# Patient Record
Sex: Male | Born: 1975 | Race: Asian | Hispanic: No | Marital: Single | Smoking: Former smoker
Health system: Southern US, Community
[De-identification: ages and names within clinical notes are randomized; demographics above are authoritative.]

## PROBLEM LIST (undated history)

## (undated) DIAGNOSIS — M79606 Pain in leg, unspecified: Principal | ICD-10-CM

## (undated) DIAGNOSIS — J189 Pneumonia, unspecified organism: Secondary | ICD-10-CM

## (undated) DIAGNOSIS — I82401 Acute embolism and thrombosis of unspecified deep veins of right lower extremity: Secondary | ICD-10-CM

## (undated) DIAGNOSIS — I8290 Acute embolism and thrombosis of unspecified vein: Secondary | ICD-10-CM

## (undated) DIAGNOSIS — T7840XA Allergy, unspecified, initial encounter: Secondary | ICD-10-CM

## (undated) DIAGNOSIS — Z720 Tobacco use: Secondary | ICD-10-CM

## (undated) DIAGNOSIS — D539 Nutritional anemia, unspecified: Secondary | ICD-10-CM

## (undated) DIAGNOSIS — F1010 Alcohol abuse, uncomplicated: Secondary | ICD-10-CM

## (undated) DIAGNOSIS — R51 Headache: Secondary | ICD-10-CM

## (undated) DIAGNOSIS — N179 Acute kidney failure, unspecified: Secondary | ICD-10-CM

## (undated) HISTORY — DX: Allergy, unspecified, initial encounter: T78.40XA

## (undated) HISTORY — DX: Nutritional anemia, unspecified: D53.9

## (undated) HISTORY — DX: Pain in leg, unspecified: M79.606

---

## 2002-01-10 ENCOUNTER — Emergency Department (HOSPITAL_COMMUNITY): Admission: EM | Admit: 2002-01-10 | Discharge: 2002-01-10 | Payer: Self-pay | Admitting: Emergency Medicine

## 2002-01-13 ENCOUNTER — Emergency Department (HOSPITAL_COMMUNITY): Admission: EM | Admit: 2002-01-13 | Discharge: 2002-01-13 | Payer: Self-pay | Admitting: Emergency Medicine

## 2003-06-20 ENCOUNTER — Emergency Department (HOSPITAL_COMMUNITY): Admission: EM | Admit: 2003-06-20 | Discharge: 2003-06-20 | Payer: Self-pay | Admitting: Emergency Medicine

## 2004-07-23 ENCOUNTER — Emergency Department (HOSPITAL_COMMUNITY): Admission: EM | Admit: 2004-07-23 | Discharge: 2004-07-23 | Payer: Self-pay | Admitting: Emergency Medicine

## 2009-03-08 ENCOUNTER — Emergency Department (HOSPITAL_COMMUNITY): Admission: EM | Admit: 2009-03-08 | Discharge: 2009-03-08 | Payer: Self-pay | Admitting: Emergency Medicine

## 2010-05-05 LAB — BASIC METABOLIC PANEL
BUN: 10 mg/dL (ref 6–23)
CO2: 29 mEq/L (ref 19–32)
Chloride: 100 mEq/L (ref 96–112)
Creatinine, Ser: 1.1 mg/dL (ref 0.4–1.5)
GFR calc non Af Amer: >60 mL/min (ref >60)
Glucose, Bld: 108 mg/dL — ABNORMAL HIGH (ref 70–99)

## 2010-05-05 LAB — DIFFERENTIAL
Basophils Absolute: 0 10*3/uL (ref 0.0–0.1)
Basophils Relative: 0 % (ref 0–1)
Lymphocytes Relative: 15 % (ref 12–46)
Lymphs Abs: 1.4 10*3/uL (ref 0.7–4.0)
Neutro Abs: 7.6 10*3/uL (ref 1.7–7.7)
Neutrophils Relative %: 80 % — ABNORMAL HIGH (ref 43–77)

## 2010-05-05 LAB — CK TOTAL AND CKMB (NOT AT ARMC)
CK, MB: 5.1 ng/mL — ABNORMAL HIGH (ref 0.3–4.0)
Relative Index: 0.4 (ref 0.0–2.5)
Total CK: 1171 U/L — ABNORMAL HIGH (ref 7–232)

## 2010-05-05 LAB — CBC
MCHC: 33 g/dL (ref 30.0–36.0)
MCV: 75.2 fL — ABNORMAL LOW (ref 78.0–100.0)
Platelets: 199 10*3/uL (ref 150–400)
WBC: 9.6 10*3/uL (ref 4.0–10.5)

## 2010-05-05 LAB — POCT CARDIAC MARKERS
CKMB, poc: 2.4 ng/mL (ref 1.0–8.0)
Myoglobin, poc: >500 ng/mL (ref 12–200)
Troponin i, poc: <0.05 ng/mL (ref 0.00–0.09)

## 2010-12-21 ENCOUNTER — Emergency Department (HOSPITAL_COMMUNITY): Payer: Medicaid Other

## 2010-12-21 ENCOUNTER — Inpatient Hospital Stay (HOSPITAL_COMMUNITY)
Admission: EM | Admit: 2010-12-21 | Discharge: 2010-12-26 | DRG: 176 | Disposition: A | Discharge status: Home / Self Care | Payer: Medicaid Other | Attending: Internal Medicine | Admitting: Internal Medicine

## 2010-12-21 DIAGNOSIS — R509 Fever, unspecified: Secondary | ICD-10-CM | POA: Diagnosis not present

## 2010-12-21 DIAGNOSIS — D72829 Elevated white blood cell count, unspecified: Secondary | ICD-10-CM | POA: Diagnosis present

## 2010-12-21 DIAGNOSIS — N179 Acute kidney failure, unspecified: Secondary | ICD-10-CM | POA: Diagnosis not present

## 2010-12-21 DIAGNOSIS — R0902 Hypoxemia: Secondary | ICD-10-CM | POA: Diagnosis present

## 2010-12-21 DIAGNOSIS — R9431 Abnormal electrocardiogram [ECG] [EKG]: Secondary | ICD-10-CM

## 2010-12-21 DIAGNOSIS — I2699 Other pulmonary embolism without acute cor pulmonale: Principal | ICD-10-CM | POA: Diagnosis present

## 2010-12-21 DIAGNOSIS — M79609 Pain in unspecified limb: Secondary | ICD-10-CM | POA: Diagnosis present

## 2010-12-21 DIAGNOSIS — E871 Hypo-osmolality and hyponatremia: Secondary | ICD-10-CM | POA: Diagnosis present

## 2010-12-21 DIAGNOSIS — R042 Hemoptysis: Secondary | ICD-10-CM | POA: Diagnosis present

## 2010-12-21 DIAGNOSIS — R072 Precordial pain: Secondary | ICD-10-CM | POA: Diagnosis present

## 2010-12-21 HISTORY — DX: Tobacco use: Z72.0

## 2010-12-21 HISTORY — DX: Alcohol abuse, uncomplicated: F10.10

## 2010-12-21 HISTORY — DX: Pneumonia, unspecified organism: J18.9

## 2010-12-21 HISTORY — DX: Acute kidney failure, unspecified: N17.9

## 2010-12-21 HISTORY — DX: Headache: R51

## 2010-12-21 LAB — URINALYSIS, ROUTINE W REFLEX MICROSCOPIC
Bilirubin Urine: NEGATIVE
Glucose, UA: NEGATIVE mg/dL
Ketones, ur: NEGATIVE mg/dL
Leukocytes, UA: NEGATIVE
Specific Gravity, Urine: >1.046 — ABNORMAL HIGH (ref 1.005–1.030)
pH: 6 (ref 5.0–8.0)

## 2010-12-21 LAB — CARDIAC PANEL(CRET KIN+CKTOT+MB+TROPI)
CK, MB: 1 ng/mL (ref 0.3–4.0)
Relative Index: 0.9 (ref 0.0–2.5)
Relative Index: 0.9 (ref 0.0–2.5)
Total CK: 112 U/L (ref 7–232)
Troponin I: <0.3 ng/mL (ref <0.30)
Troponin I: <0.3 ng/mL

## 2010-12-21 LAB — POCT I-STAT, CHEM 8
BUN: 14 mg/dL (ref 6–23)
Calcium, Ion: 1.35 mmol/L — ABNORMAL HIGH (ref 1.12–1.32)
Chloride: 100 mEq/L (ref 96–112)
Creatinine, Ser: 1.2 mg/dL (ref 0.50–1.35)
TCO2: 29 mmol/L (ref 0–100)

## 2010-12-21 LAB — COMPREHENSIVE METABOLIC PANEL
AST: 18 U/L (ref 0–37)
Albumin: 3.7 g/dL (ref 3.5–5.2)
Alkaline Phosphatase: 80 U/L (ref 39–117)
Chloride: 99 mEq/L (ref 96–112)
Potassium: 4 mEq/L (ref 3.5–5.1)
Total Bilirubin: 2.6 mg/dL — ABNORMAL HIGH (ref 0.3–1.2)
Total Protein: 7.3 g/dL (ref 6.0–8.3)

## 2010-12-21 LAB — PROTIME-INR
INR: 1.04 (ref 0.00–1.49)
Prothrombin Time: 13.8 seconds (ref 11.6–15.2)

## 2010-12-21 LAB — SEDIMENTATION RATE: Sed Rate: 7 mm/hr (ref 0–16)

## 2010-12-21 LAB — HEPARIN LEVEL (UNFRACTIONATED)
Heparin Unfractionated: 0.37 [IU]/mL (ref 0.30–0.70)
Heparin Unfractionated: 0.29 IU/mL — ABNORMAL LOW (ref 0.30–0.70)

## 2010-12-21 LAB — D-DIMER, QUANTITATIVE: D-Dimer, Quant: 9.52 ug/mL-FEU — ABNORMAL HIGH (ref 0.00–0.48)

## 2010-12-21 LAB — CBC
MCV: 72.6 fL — ABNORMAL LOW (ref 78.0–100.0)
Platelets: 197 10*3/uL (ref 150–400)
RBC: 6.09 MIL/uL — ABNORMAL HIGH (ref 4.22–5.81)
RDW: 13.4 % (ref 11.5–15.5)
WBC: 9.7 10*3/uL (ref 4.0–10.5)

## 2010-12-21 MED ORDER — HEPARIN BOLUS VIA INFUSION
6000.0000 [IU] | Freq: Once | INTRAVENOUS | Status: AC
  Administered 2010-12-21: 6000 [IU] via INTRAVENOUS
  Filled 2010-12-21: qty 6000

## 2010-12-21 MED ORDER — ALUM & MAG HYDROXIDE-SIMETH 200-200-20 MG/5ML PO SUSP: 30.0000 mL | Freq: Four times a day (QID) | ORAL | Status: DC | PRN

## 2010-12-21 MED ORDER — ALBUTEROL SULFATE (5 MG/ML) 0.5% IN NEBU
2.5000 mg | INHALATION_SOLUTION | RESPIRATORY_TRACT | Status: DC | PRN
  Filled 2010-12-21: qty 0.5

## 2010-12-21 MED ORDER — MORPHINE SULFATE 2 MG/ML IJ SOLN
INTRAMUSCULAR | Status: AC
  Administered 2010-12-21: 6 mg via INTRAVENOUS
  Filled 2010-12-21: qty 1

## 2010-12-21 MED ORDER — WARFARIN SODIUM 7.5 MG PO TABS
7.5000 mg | ORAL_TABLET | Freq: Once | ORAL | Status: AC
  Administered 2010-12-21: 7.5 mg via ORAL
  Filled 2010-12-21 (×2): qty 1

## 2010-12-21 MED ORDER — OXYCODONE HCL 5 MG PO TABS
5.0000 mg | ORAL_TABLET | ORAL | Status: DC | PRN
  Administered 2010-12-22 – 2010-12-26 (×8): 5 mg via ORAL
  Filled 2010-12-21 (×7): qty 1

## 2010-12-21 MED ORDER — MORPHINE SULFATE 4 MG/ML IJ SOLN
6.0000 mg | Freq: Once | INTRAMUSCULAR | Status: AC
  Administered 2010-12-21: 6 mg via INTRAVENOUS

## 2010-12-21 MED ORDER — HEPARIN BOLUS VIA INFUSION: 80.0000 [IU]/kg | Freq: Once | INTRAVENOUS | Status: DC

## 2010-12-21 MED ORDER — ZOLPIDEM TARTRATE 5 MG PO TABS
5.0000 mg | ORAL_TABLET | Freq: Every evening | ORAL | Status: DC | PRN
  Administered 2010-12-21: 5 mg via ORAL

## 2010-12-21 MED ORDER — DOCUSATE SODIUM 100 MG PO CAPS
100.0000 mg | ORAL_CAPSULE | Freq: Two times a day (BID) | ORAL | Status: DC
  Administered 2010-12-21 – 2010-12-26 (×10): 100 mg via ORAL
  Filled 2010-12-21 (×12): qty 1

## 2010-12-21 MED ORDER — SODIUM CHLORIDE 0.9 % IJ SOLN
3.0000 mL | INTRAMUSCULAR | Status: DC | PRN
  Filled 2010-12-21: qty 3

## 2010-12-21 MED ORDER — HEPARIN (PORCINE) IN NACL 100-0.45 UNIT/ML-% IJ SOLN
1650.0000 [IU]/h | INTRAMUSCULAR | Status: DC
  Filled 2010-12-21 (×3): qty 250

## 2010-12-21 MED ORDER — MORPHINE SULFATE 2 MG/ML IJ SOLN
INTRAMUSCULAR | Status: AC
  Administered 2010-12-21: 2 mg via INTRAVENOUS
  Filled 2010-12-21: qty 1

## 2010-12-21 MED ORDER — WARFARIN VIDEO
Freq: Once | Status: AC
  Administered 2010-12-22: 12:00:00
  Filled 2010-12-21: qty 1

## 2010-12-21 MED ORDER — HEPARIN (PORCINE) IN NACL 100-0.45 UNIT/ML-% IJ SOLN: 1450.0000 [IU]/h | INTRAMUSCULAR | Status: DC

## 2010-12-21 MED ORDER — ACETAMINOPHEN 325 MG PO TABS
650.0000 mg | ORAL_TABLET | ORAL | Status: DC | PRN
  Administered 2010-12-22 – 2010-12-25 (×4): 650 mg via ORAL
  Filled 2010-12-21 (×4): qty 2

## 2010-12-21 MED ORDER — IOHEXOL 300 MG/ML  SOLN
100.0000 mL | Freq: Once | INTRAMUSCULAR | Status: AC | PRN
  Administered 2010-12-21: 100 mL via INTRAVENOUS

## 2010-12-21 MED ORDER — SENNA 8.6 MG PO TABS
2.0000 | ORAL_TABLET | Freq: Every day | ORAL | Status: DC | PRN
  Administered 2010-12-24: 17.2 mg via ORAL
  Filled 2010-12-21 (×2): qty 2

## 2010-12-21 MED ORDER — ZOLPIDEM TARTRATE 5 MG PO TABS
ORAL_TABLET | ORAL | Status: AC
  Administered 2010-12-21: 5 mg via ORAL
  Filled 2010-12-21: qty 1

## 2010-12-21 MED ORDER — HEPARIN (PORCINE) IN NACL 100-0.45 UNIT/ML-% IJ SOLN: 16.0000 [IU]/kg/h | INTRAMUSCULAR | Status: DC

## 2010-12-21 MED ORDER — PATIENT'S GUIDE TO USING COUMADIN BOOK
Freq: Once | Status: AC
  Administered 2010-12-21: 18:00:00
  Filled 2010-12-21 (×2): qty 1

## 2010-12-21 MED ORDER — OXYCODONE HCL 5 MG PO TABS
ORAL_TABLET | ORAL | Status: AC
  Administered 2010-12-21: 5 mg
  Administered 2010-12-21: 5 mg via ORAL
  Filled 2010-12-21: qty 1

## 2010-12-21 MED ORDER — ONDANSETRON HCL 4 MG/2ML IJ SOLN: 4.0000 mg | Freq: Four times a day (QID) | INTRAMUSCULAR | Status: DC | PRN

## 2010-12-21 MED ORDER — IPRATROPIUM BROMIDE 0.02 % IN SOLN: 0.5000 mg | RESPIRATORY_TRACT | Status: DC | PRN

## 2010-12-21 MED ORDER — MORPHINE SULFATE 4 MG/ML IJ SOLN
INTRAMUSCULAR | Status: AC
  Filled 2010-12-21: qty 1

## 2010-12-21 MED ORDER — MORPHINE SULFATE 2 MG/ML IJ SOLN
2.0000 mg | INTRAMUSCULAR | Status: DC | PRN
  Administered 2010-12-21 – 2010-12-22 (×3): 2 mg via INTRAVENOUS
  Filled 2010-12-21 (×2): qty 1

## 2010-12-21 NOTE — ED Notes (Signed)
Consulted with pharmacy on duplicate orders.  Orders are 6000 unit bolus then change rate to 1,450 units per hours

## 2010-12-21 NOTE — Plan of Care (Signed)
Problem: Consults Goal: Diagnosis - Venous Thromboembolism (VTE) Choose a selection PE (Pulmonary Embolism)     

## 2010-12-21 NOTE — Progress Notes (Signed)
ANTICOAGULATION CONSULT NOTE - Initial Consult  Pharmacy Consult for Heparin, Warfarin Indication: Acute bilateral pulmonary emboli; bilateral DVT  No Known Allergies  Patient Measurements: Weight: 175 lb (79.379 kg)   Vital Signs: Temp: 98.8 F (37.1 C) (11/04 1214) Temp src: Oral (11/04 1214) BP: 116/56 mmHg (11/04 1214) Pulse Rate: 97  (11/04 1214)  Labs:  Basename 12/21/10 1147 12/21/10 0948 12/21/10 0636 12/21/10 0552 12/21/10 0210 12/21/10 0050  HGB -- -- -- -- 17.3* 15.3  HCT -- -- -- -- 51.0 44.2  PLT -- -- -- -- -- 197  APTT -- -- -- -- -- --  LABPROT -- -- -- 13.8 -- --  INR -- -- -- 1.04 -- --  HEPARINUNFRC -- -- <0.10* -- -- --  CREATININE -- 1.16 -- -- 1.20 --  CKTOTAL 197 -- -- -- -- --  CKMB 1.8 -- -- -- -- --  TROPONINI <0.30 -- -- -- -- --     Medical History: Past Medical History  Diagnosis Date  . Tobacco abuse   . ETOH abuse    Scheduled meds: Docusate 100mg  PO b.i.d.  PRN meds: Acetaminophen, albuterol, Mylanta, Atrovent, morphine, ondansetron, oxycodone, senna, zolpidem  *Heparin was started in ED with 6000 units IV bolus and 1450 units/hr IV infusion.    Assessment:  Acute bilateral PE/DVT, heparin already started, to begin warfarin today also.  Goal of Therapy:   Heparin level 0.3-0.7 units/ml  INR 2-3   Plan:   Check heparin level stat, then daily  CBC daily  Warfarin 7.5mg  PO tonight.  Protime/INR daily  Begin warfarin teaching.  Darion Juhasz, Brynda Greathouse K 12/21/2010,3:00 PM

## 2010-12-21 NOTE — ED Notes (Signed)
MD notified of pain level.

## 2010-12-21 NOTE — Progress Notes (Signed)
*  PRELIMINARY RESULTS*   Bilateral Venous Dopplers have  been performed. There is acute, occlusive deep vein thrombosis noted throughout the posterior tibial, coursing through to the popliteal, distal and mid femoral veins of the left lower extremity.  Sluggish flow is noted throughout the right lower extremity.   Sherren Kerns Renee 12/21/2010, 11:31 AM

## 2010-12-21 NOTE — ED Notes (Signed)
Unable to stick patient for labs made RN aware they stated they will try

## 2010-12-21 NOTE — ED Notes (Signed)
Vital signs stable. 

## 2010-12-21 NOTE — Consult Note (Signed)
Referring Physician: Ramiro Harvest, MD - [facility] Primary Physician: none Primary Cardiologist: none Reason for Consultation: Abnormal ECG in setting of bilateral PE  HPI:  Tom Dalton is a 35 yo [place] male with a history of tobacco and ETOH abuse but no other PMHx who presents to ED with several hour history of left lower substernal pleuritic chest pain. Pt states several weeks prior to admission he developed left calf pain after jumping and felt likely muskuloskeletal in nature. Pt states 1 day prior to admission while at work and after eating his lunch, at around 130pm -200pm he developed left lower substernal chest pain, pleuritic in nature and intermittent. Patient states over the afternoon his chest pain worsened, and he went home at around 630pm. Patient states around midnight his pain worsened and he presented to ED. Noted that L calf much more tender and swollen. Patient denies fever, no HA, no nausea, no emesis, no cough, no abdominal pain , no dysuria, no hemetemesis, no melema, no hematochezia, no weakness. Patient denies any family history of DVT, no recent travel, no recent surgeries or calf injuries. Patient was seen in ED CT angiogram of chest consistent with bilateral PE. Started on heparin.   Cardiology consulted called due to abnormal ECG. ECG with SR 77 with diffuse upsloping ST elevation suggestive of early repol. No change from ECG 03/08/2009.  LE u/s + for bilateral DVTs  No FHX of premature CAD or thromboembolic disease.   ROS: All other systems normal except as mentioned in HPI, past medical history and problem list.    History reviewed. No pertinent past medical history.  Medications Prior to Admission  Medication Dose Route Frequency Provider Last Rate Last Dose  . acetaminophen (TYLENOL) tablet 650 mg  650 mg Oral Q4H PRN Ramiro Harvest      . albuterol (PROVENTIL) (5 MG/ML) 0.5% nebulizer solution 2.5 mg  2.5 mg Nebulization Q2H PRN Ramiro Harvest      . alum  & mag hydroxide-simeth (MAALOX/MYLANTA) 200-200-20 MG/5ML suspension 30 mL  30 mL Oral Q6H PRN Ramiro Harvest      . docusate sodium (COLACE) capsule 100 mg  100 mg Oral BID Ramiro Harvest      . heparin 100 units/mL bolus via infusion 6,000 Units  6,000 Units Intravenous Once Vida Roller, MD   6,000 Units at 12/21/10 0545  . heparin ADULT infusion 100 units/mL (25000 units/250 mL)  1,450 Units/hr Intravenous Continuous Vida Roller, MD      . iohexol (OMNIPAQUE) 300 MG/ML injection 100 mL  100 mL Intravenous Once PRN Vida Roller, MD   100 mL at 12/21/10 0432  . ipratropium (ATROVENT) nebulizer solution 0.5 mg  0.5 mg Nebulization Q2H PRN Ramiro Harvest      . morphine 2 MG/ML injection 2 mg  2 mg Intravenous Q4H PRN Ramiro Harvest      . morphine 2 MG/ML injection        6 mg at 12/21/10 0545  . morphine 4 MG/ML injection 6 mg  6 mg Intravenous Once Vida Roller, MD   6 mg at 12/21/10 0545  . morphine 4 MG/ML injection           . ondansetron (ZOFRAN) injection 4 mg  4 mg Intravenous Q6H PRN Ramiro Harvest      . oxyCODONE (Oxy IR/ROXICODONE) immediate release tablet 5 mg  5 mg Oral Q4H PRN Ramiro Harvest      . senna (SENOKOT) tablet 17.2 mg  2  tablet Oral Daily PRN Ramiro Harvest      . sodium chloride 0.9 % injection 3 mL  3 mL Intravenous PRN Ramiro Harvest      . zolpidem (AMBIEN) tablet 5 mg  5 mg Oral QHS PRN Ramiro Harvest      . DISCONTD: heparin 100 units/mL bolus via infusion 80 Units/kg  80 Units/kg Intravenous Once Vida Roller, MD      . DISCONTD: heparin ADULT infusion 100 units/mL (25000 units/250 mL)  16 Units/kg/hr Intravenous Continuous Vida Roller, MD      . DISCONTD: heparin ADULT infusion 100 units/mL (25000 units/250 mL)  1,450 Units/hr Intravenous Continuous Vida Roller, MD      . DISCONTD: heparin ADULT infusion 100 units/mL (25000 units/250 mL)  1,450 Units/hr Intravenous Continuous Julian Crowford Darlina Guys., PHARMD       No current  outpatient prescriptions on file as of 12/21/2010.   Infusions:    . heparin    . DISCONTD: heparin    . DISCONTD: heparin    . DISCONTD: heparin      No Known Allergies  History   Social History  . Marital Status: Single    Spouse Name: N/A    Number of Children: 3  . Years of Education: N/A   Occupational History  . Fingernail engineer    Social History Main Topics  . Smoking status: Current Everyday Smoker -- 0.1 packs/day    Types: Cigarettes  . Smokeless tobacco: Not on file  . Alcohol Use: 9.0 oz/week    15 Cans of beer per week     Drinks 8-10 beers on Saturday and Sundays  . Drug Use: No     Denies  . Sexually Active: Not on file   Other Topics Concern  . Not on file   Social History Narrative  . No narrative on file    Family History  Problem Relation Age of Onset  . Stroke Father     no premature CAD  . Healthy Mother     no premature CAD    PHYSICAL EXAM: Filed Vitals:   12/21/10 0828  BP: 114/49  Pulse: 87  Temp: 98.1 F (36.7 C)  Resp: 22   General:  Well appearing. No respiratory difficulty HEENT: normal Neck: supple. no JVD. Carotids 2+ bilat; no bruits. No lymphadenopathy or thryomegaly appreciated. Cor: PMI nondisplaced. Regular rate & rhythm. No rubs, gallops or murmurs. Lungs: clear Abdomen: soft, nontender, nondistended. No hepatosplenomegaly. No bruits or masses. Good bowel sounds. Extremities: no cyanosis, clubbing, rash, edema Neuro: alert & oriented x 3, cranial nerves grossly intact. moves all 4 extremities w/o difficulty. Affect pleasant.  ECG: SR 77 diffuse ST elevation c/w early repol. No change from previous  Results for orders placed during the hospital encounter of 12/21/10 (from the past 24 hour(s))  CBC     Status: Abnormal   Collection Time   12/21/10 12:50 AM      Component Value Range   WBC 9.7  4.0 - 10.5 (K/uL)   RBC 6.09 (*) 4.22 - 5.81 (MIL/uL)   Hemoglobin 15.3  13.0 - 17.0 (g/dL)   HCT 16.1  09.6 -  04.5 (%)   MCV 72.6 (*) 78.0 - 100.0 (fL)   MCH 25.1 (*) 26.0 - 34.0 (pg)   MCHC 34.6  30.0 - 36.0 (g/dL)   RDW 40.9  81.1 - 91.4 (%)   Platelets 197  150 - 400 (K/uL)  D-DIMER, QUANTITATIVE  Status: Abnormal   Collection Time   12/21/10 12:50 AM      Component Value Range   D-Dimer, Quant 9.52 (*) 0.00 - 0.48 (ug/mL-FEU)  SEDIMENTATION RATE     Status: Normal   Collection Time   12/21/10 12:50 AM      Component Value Range   Sed Rate 7  0 - 16 (mm/hr)  PROTIME-INR     Status: Normal   Collection Time   12/21/10  5:52 AM      Component Value Range   Prothrombin Time 13.8  11.6 - 15.2 (seconds)   INR 1.04  0.00 - 1.49   HEPARIN LEVEL     Status: Abnormal   Collection Time   12/21/10  6:36 AM      Component Value Range   Heparin Unfractionated <0.10 (*) 0.30 - 0.70 (IU/mL)  COMPREHENSIVE METABOLIC PANEL     Status: Abnormal   Collection Time   12/21/10  9:48 AM      Component Value Range   Sodium 137  135 - 145 (mEq/L)   Potassium 4.0  3.5 - 5.1 (mEq/L)   Chloride 99  96 - 112 (mEq/L)   CO2 30  19 - 32 (mEq/L)   Glucose, Bld 98  70 - 99 (mg/dL)   BUN 12  6 - 23 (mg/dL)   Creatinine, Ser 4.09  0.50 - 1.35 (mg/dL)   Calcium 9.8  8.4 - 81.1 (mg/dL)   Total Protein 7.3  6.0 - 8.3 (g/dL)   Albumin 3.7  3.5 - 5.2 (g/dL)   AST 18  0 - 37 (U/L)   ALT 27  0 - 53 (U/L)   Alkaline Phosphatase 80  39 - 117 (U/L)   Total Bilirubin 2.6 (*) 0.3 - 1.2 (mg/dL)   GFR calc non Af Amer 80 (*) >90 (mL/min)   GFR calc Af Amer >90  >90 (mL/min)  MAGNESIUM     Status: Normal   Collection Time   12/21/10  9:48 AM      Component Value Range   Magnesium 1.8  1.5 - 2.5 (mg/dL)   Ct Angio Chest W/cm &/or Wo Cm  12/21/2010  *RADIOLOGY REPORT*  Clinical Data:  Chest pain and elevated D-dimer  CT ANGIOGRAPHY CHEST WITH CONTRAST  Technique:  Multidetector CT imaging of the chest was performed using the standard protocol during bolus administration of intravenous contrast.  Multiplanar CT image  reconstructions including MIPs were obtained to evaluate the vascular anatomy.  Contrast: OMNIPAQUE IOHEXOL 300 MG/ML IV SOLN  Comparison:  12/21/2010 radiograph  Findings:  Bilateral lobar are and segmental branch pulmonary emboli, with the largest clot burden is supplying the lower lobes. Heart size within normal limits.  No overt evidence for right ventricular heart strain.  No pericardial or pleural effusion.  No intrathoracic lymphadenopathy.  Normal course and caliber aorta.  Limited images through the upper abdomen show no acute abnormality.  Central airways are patent.  There are bibasilar and lingular opacities which may reflect atelectasis or infarct.  No pneumothorax.  No acute osseous abnormality.  Review of the MIP images confirms the above findings.  IMPRESSION: Bilateral pulmonary emboli within lobar and segmental branches.  No overt evidence for right ventricular heart strain.  Bibasilar and linear opacities may reflect atelectasis or infarcts.  Coronary artery calcification.  Findings discussed via telephone with Dr. Hyacinth Meeker at 05:00 a.m. on 12/21/2010  Original Report Authenticated By: Waneta Martins, M.D.     ASSESSMENT: 1)  Acute pulmonary embolism, bilateral 2) CP due to #1 3) Early repol on ECG, no change from previous 4) ETOH abuse 5) Tobacco use  PLAN:  Hemodynamically stable. Respiratory status stable. No evidence of RV strain clinically. ECG abnormalities due to early repol (no change from previous). Would continue heparin/coumadin. Consider hypercoaguable work-up. Check echo - o/w no further cardiac work-up. Counseled on need for tobacco and ETOH cessation.  Will sign-off. Please call with questions.

## 2010-12-21 NOTE — Progress Notes (Signed)
ANTICOAGULATION CONSULT NOTE - Follow Up Consult  Pharmacy Consult for Heparin Indication: PE/DVT  No Known Allergies  Patient Measurements: Height: 5\' 4"  (162.6 cm) Weight: 175 lb 7.8 oz (79.6 kg) IBW/kg (Calculated) : 59.2    Vital Signs: Temp: 100 F (37.8 C) (11/04 1611) Temp src: Oral (11/04 1611) BP: 134/74 mmHg (11/04 1611) Pulse Rate: 88  (11/04 1611)  Labs:  Basename 12/21/10 1545 12/21/10 1147 12/21/10 0948 12/21/10 0636 12/21/10 0552 12/21/10 0210 12/21/10 0050  HGB -- -- -- -- -- 17.3* 15.3  HCT -- -- -- -- -- 51.0 44.2  PLT -- -- -- -- -- -- 197  APTT -- -- -- -- -- -- --  LABPROT -- -- -- -- 13.8 -- --  INR -- -- -- -- 1.04 -- --  HEPARINUNFRC 0.37 -- -- <0.10* -- -- --  CREATININE -- -- 1.16 -- -- 1.20 --  CKTOTAL 154 197 -- -- -- -- --  CKMB 1.4 1.8 -- -- -- -- --  TROPONINI <0.30 <0.30 -- -- -- -- --   Estimated Creatinine Clearance: 83.9 ml/min (by C-G formula based on Cr of 1.16).     Assessment: 35 yo M w/ acute PE/DVT on Heparin drip per pharmacy. Current rate = 1450 units/hour. Heparin level = 0.37. No bleeding reported.  Goal of Therapy:  Heparin level 0.3-0.7 units/ml   Plan:  No change to heparin rate. Will recheck heparin level at 2200.   Gwen Her 12/21/2010,6:26 PM

## 2010-12-21 NOTE — H&P (Signed)
Tom Dalton MRN: [id number] DOB/AGE: 1975/03/04 35 y.o. Primary Care Physician:Provider Not In System Admit date: 12/21/2010 Chief Complaint: CHEST PAIN HPI:  Tom Dalton is a 35 yo [place] male with no significant medical history, who presents to ED with sevral hour history of left lower substernal pleuritic chest pain. Pt states 1 week prior to admission he developed left calf pain and felt likely muskuloskeletal in nature. Pt states 1 day prior to admission while at work and after eating his lunch, at around 130pm -200pm he developed left lower substernal chest pain, pleuritic in nature and intermittent. Patient states over the afternoon his chest pain worsened, and he went home at around 630pm. Patient states around midnight his pain worsened and he presented to ED. Patient denies fever, no HA, no nausea, no emesis, no cough, no abdominal pain , no dysuria, no hemetemesis, no melema, no hematochezia, no weakness. Patient denies any family history of DVT, no recent travel, no recent surgeries. Patient was seen in ED CT angiogram of chest consistent with bilateral PE. We were callled to admit for further evaluation and reccommendations. Patient at time of interview on heparin drip.  History reviewed. No pertinent past medical history.  History reviewed. No pertinent past surgical history.  Prior to Admission medications   Not on File    Allergies: No Known Allergies  History reviewed. No pertinent family history.  Social History: Occasional tobacco, occasional alcholol use. No IVDU. Patient lives with his girlfriend of 16 years and has 2 daughters 27, 48 and a step daughter 10y. Patient works as a Advertising account planner.         ROS: As per HPI otherwise normal.  PHYSICAL EXAM: Blood pressure 114/49, pulse 87, temperature 98.1 F (36.7 C), temperature source Oral, resp. rate 22, weight 79.379 kg (175 lb), SpO2 100.00%. General: Alert, awake, oriented x3, in no acute distress. HEENT:  [place]/AT. PERRLA, EOMI, Orophaynx clear no lesions, no exudate. Neck supple no LAD.No bruits, no goiter. Heart: Regular rate and rhythm, without murmurs, rubs, gallops. Lungs: Clear to auscultation bilaterally. Abdomen: Soft, nontender, nondistended, positive bowel sounds. Extremities: No clubbing cyanosis or edema with positive pedal pulses. LLE with calf tenderness. Positive Homans sign. Neuro: CN II-VII grossly intact. Nonfocal.    @labtest @  EKG: Not ordered. Pending.  No results found for this or any previous visit (from the past 240 hour(s)).   Results for orders placed during the hospital encounter of 12/21/10 (from the past 48 hour(s))  CBC     Status: Abnormal   Collection Time   12/21/10 12:50 AM      Component Value Range Comment   WBC 9.7  4.0 - 10.5 (K/uL)    RBC 6.09 (*) 4.22 - 5.81 (MIL/uL)    Hemoglobin 15.3  13.0 - 17.0 (g/dL)    HCT 40.9  81.1 - 91.4 (%)    MCV 72.6 (*) 78.0 - 100.0 (fL)    MCH 25.1 (*) 26.0 - 34.0 (pg)    MCHC 34.6  30.0 - 36.0 (g/dL)    RDW 78.2  95.6 - 21.3 (%)    Platelets 197  150 - 400 (K/uL)   D-DIMER, QUANTITATIVE     Status: Abnormal   Collection Time   12/21/10 12:50 AM      Component Value Range Comment   D-Dimer, Quant 9.52 (*) 0.00 - 0.48 (ug/mL-FEU)   SEDIMENTATION RATE     Status: Normal   Collection Time   12/21/10 12:50 AM  Component Value Range Comment   Sed Rate 7  0 - 16 (mm/hr)   PROTIME-INR     Status: Normal   Collection Time   12/21/10  5:52 AM      Component Value Range Comment   Prothrombin Time 13.8  11.6 - 15.2 (seconds)    INR 1.04  0.00 - 1.49    HEPARIN LEVEL     Status: Abnormal   Collection Time   12/21/10  6:36 AM      Component Value Range Comment   Heparin Unfractionated <0.10 (*) 0.30 - 0.70 (IU/mL)     Ct Angio Chest W/cm &/or Wo Cm  12/21/2010  *RADIOLOGY REPORT*  Clinical Data:  Chest pain and elevated D-dimer  CT ANGIOGRAPHY CHEST WITH CONTRAST  Technique:  Multidetector CT imaging of the  chest was performed using the standard protocol during bolus administration of intravenous contrast.  Multiplanar CT image reconstructions including MIPs were obtained to evaluate the vascular anatomy.  Contrast: OMNIPAQUE IOHEXOL 300 MG/ML IV SOLN  Comparison:  12/21/2010 radiograph  Findings:  Bilateral lobar are and segmental branch pulmonary emboli, with the largest clot burden is supplying the lower lobes. Heart size within normal limits.  No overt evidence for right ventricular heart strain.  No pericardial or pleural effusion.  No intrathoracic lymphadenopathy.  Normal course and caliber aorta.  Limited images through the upper abdomen show no acute abnormality.  Central airways are patent.  There are bibasilar and lingular opacities which may reflect atelectasis or infarct.  No pneumothorax.  No acute osseous abnormality.  Review of the MIP images confirms the above findings.  IMPRESSION: Bilateral pulmonary emboli within lobar and segmental branches.  No overt evidence for right ventricular heart strain.  Bibasilar and linear opacities may reflect atelectasis or infarcts.  Coronary artery calcification.  Findings discussed via telephone with Dr. Hyacinth Meeker at 05:00 a.m. on 12/21/2010  Original Report Authenticated By: Waneta Martins, M.D.    Impression/Plan  Principal Problem:  *Pulmonary embolism 1. Unknown etiology. Patient with no family history of DVT/PE, no recent travel, no recent surgeries. Patient already on heparin and as such unable to obtain a hypercoagulable panel. Admit to telemetry.  Will check CMET, 2 d echo to r/o RV strain, check bilateral lower extremity dopplers to r/o DVT. Continue heparin drip, and start coumadin. Place on oxygen, pain management. Follow. 2. LLE pain - Likely secondary to DVT. Patient with positive Homans sign. Check bilateral lower extremity dopplers. Patient already on anticoagulation. Pain management. Follow. 3. Prophylaxis- Patient on heparin for # 1,  for DVT prophylaxis.        Sharlie Shreffler 12/21/2010, 9:23 AM

## 2010-12-21 NOTE — Consult Note (Signed)
ANTICOAGULATION CONSULT NOTE - Follow Up Consult  Pharmacy Consult for Heparin  Indication: PE/DVT   No Known Allergies  Patient Measurements: Height: 5\' 4"  (162.6 cm) Weight: 175 lb 7.8 oz (79.6 kg) IBW/kg (Calculated) : 59.2  Adjusted Body Weight: 59.2  Vital Signs: Temp: 98.3 F (36.8 C) (11/04 2300) Temp src: Oral (11/04 2300) BP: 131/73 mmHg (11/04 2300) Pulse Rate: 94  (11/04 2300)  Labs:  Basename 12/21/10 2237 12/21/10 2230 12/21/10 1545 12/21/10 1147 12/21/10 0948 12/21/10 0636 12/21/10 0552 12/21/10 0210 12/21/10 0050  HGB -- -- -- -- -- -- -- 17.3* 15.3  HCT -- -- -- -- -- -- -- 51.0 44.2  PLT -- -- -- -- -- -- -- -- 197  APTT -- -- -- -- -- -- -- -- --  LABPROT -- -- -- -- -- -- 13.8 -- --  INR -- -- -- -- -- -- 1.04 -- --  HEPARINUNFRC -- 0.29* 0.37 -- -- <0.10* -- -- --  CREATININE -- -- -- -- 1.16 -- -- 1.20 --  CKTOTAL 112 -- 154 197 -- -- -- -- --  CKMB 1.0 -- 1.4 1.8 -- -- -- -- --  TROPONINI <0.30 -- <0.30 <0.30 -- -- -- -- --   Estimated Creatinine Clearance: 83.9 ml/min (by C-G formula based on Cr of 1.16).   Medications:    Assessment: 35 yo M w/ acute PE/DVT on Heparin drip per pharmacy. Current rate = 1450 units/hour. Heparin level = 0.29. No bleeding reported.   Level low.    Goal of Therapy:  Heparin level 0.3-0.7 units/ml    Plan:  Will increase to 1550 units/hr, check level at 0600  Darlina Guys, Jacquenette Shone Crowford 12/21/2010,11:45 PM

## 2010-12-21 NOTE — ED Notes (Signed)
Patient is resting comfortably. 

## 2010-12-21 NOTE — ED Notes (Signed)
Patient here with left flank pain since 2pm today, also with nausea after eating

## 2010-12-21 NOTE — ED Notes (Signed)
MD at bedside. 

## 2010-12-21 NOTE — Progress Notes (Signed)
  Echocardiogram 2D Echocardiogram has been performed.  Daymon Larsen, RDCS 12/21/2010, 12:49 PM

## 2010-12-22 ENCOUNTER — Inpatient Hospital Stay (HOSPITAL_COMMUNITY): Payer: Medicaid Other

## 2010-12-22 DIAGNOSIS — R509 Fever, unspecified: Secondary | ICD-10-CM | POA: Diagnosis not present

## 2010-12-22 LAB — CBC
HCT: 39.6 % (ref 39.0–52.0)
Hemoglobin: 13.2 g/dL (ref 13.0–17.0)
MCHC: 33.3 g/dL (ref 30.0–36.0)
MCV: 73.7 fL — ABNORMAL LOW (ref 78.0–100.0)
RDW: 13.2 % (ref 11.5–15.5)

## 2010-12-22 LAB — BLOOD GAS, ARTERIAL
Acid-Base Excess: 4.3 mmol/L — ABNORMAL HIGH (ref 0.0–2.0)
Drawn by: 29757
FIO2: 0.21 %
O2 Saturation: 93.9 %
Patient temperature: 98.6
pO2, Arterial: 64.9 mmHg — ABNORMAL LOW (ref 80.0–100.0)

## 2010-12-22 LAB — BASIC METABOLIC PANEL
BUN: 15 mg/dL (ref 6–23)
Chloride: 95 mEq/L — ABNORMAL LOW (ref 96–112)
Creatinine, Ser: 1.12 mg/dL (ref 0.50–1.35)
GFR calc Af Amer: >90 mL/min (ref >90)
GFR calc non Af Amer: 83 mL/min — ABNORMAL LOW (ref >90)
Glucose, Bld: 116 mg/dL — ABNORMAL HIGH (ref 70–99)

## 2010-12-22 LAB — APTT: aPTT: 110 seconds — ABNORMAL HIGH (ref 24–37)

## 2010-12-22 LAB — HEPARIN LEVEL (UNFRACTIONATED)
Heparin Unfractionated: 0.28 IU/mL — ABNORMAL LOW (ref 0.30–0.70)
Heparin Unfractionated: 0.29 IU/mL — ABNORMAL LOW (ref 0.30–0.70)

## 2010-12-22 MED ORDER — HEPARIN (PORCINE) IN NACL 100-0.45 UNIT/ML-% IJ SOLN
1850.0000 [IU]/h | INTRAMUSCULAR | Status: DC
  Administered 2010-12-23 (×2): 1850 [IU]/h via INTRAVENOUS
  Filled 2010-12-22 (×6): qty 250

## 2010-12-22 MED ORDER — LORAZEPAM 2 MG/ML IJ SOLN
1.0000 mg | Freq: Four times a day (QID) | INTRAMUSCULAR | Status: DC | PRN
  Administered 2010-12-22: 1 mg via INTRAVENOUS
  Filled 2010-12-22: qty 1

## 2010-12-22 MED ORDER — MORPHINE SULFATE 2 MG/ML IJ SOLN: 2.0000 mg | INTRAMUSCULAR | Status: AC

## 2010-12-22 MED ORDER — ALBUTEROL SULFATE (5 MG/ML) 0.5% IN NEBU
2.5000 mg | INHALATION_SOLUTION | RESPIRATORY_TRACT | Status: DC | PRN
Start: 2010-12-22 — End: 2010-12-26

## 2010-12-22 MED ORDER — OXYCODONE HCL 5 MG PO TABS
ORAL_TABLET | ORAL | Status: AC
  Administered 2010-12-22: 5 mg via ORAL
  Filled 2010-12-22: qty 1

## 2010-12-22 MED ORDER — WARFARIN SODIUM 7.5 MG PO TABS
7.5000 mg | ORAL_TABLET | Freq: Once | ORAL | Status: AC
  Administered 2010-12-22: 7.5 mg via ORAL
  Filled 2010-12-22: qty 1

## 2010-12-22 MED ORDER — KETOROLAC TROMETHAMINE 30 MG/ML IJ SOLN
30.0000 mg | Freq: Four times a day (QID) | INTRAMUSCULAR | Status: DC
  Administered 2010-12-22 – 2010-12-24 (×7): 30 mg via INTRAVENOUS
  Filled 2010-12-22 (×11): qty 1

## 2010-12-22 NOTE — Progress Notes (Signed)
ANTICOAGULATION CONSULT NOTE - Follow Up Consult  Pharmacy Consult for Warfarin and Heparin  Indication: pulmonary embolus and DVTs  No Known Allergies  Patient Measurements: Height: 5\' 4"  (162.6 cm) Weight: 173 lb 8 oz (78.7 kg) IBW/kg (Calculated) : 59.2   Vital Signs: Temp: 100.5 F (38.1 C) (11/05 1035) Temp src: Oral (11/05 1035) BP: 125/83 mmHg (11/05 1035) Pulse Rate: 95  (11/05 1035)  Labs:  Basename 12/22/10 1145 12/22/10 0423 12/22/10 0422 12/21/10 2237 12/21/10 2230 12/21/10 1545 12/21/10 1147 12/21/10 0948 12/21/10 0552 12/21/10 0210 12/21/10 0050  HGB -- -- 13.2 -- -- -- -- -- -- 17.3* --  HCT -- -- 39.6 -- -- -- -- -- -- 51.0 44.2  PLT -- -- 191 -- -- -- -- -- -- -- 197  APTT -- -- 110* -- -- -- -- -- -- -- --  LABPROT -- -- 15.1 -- -- -- -- -- 13.8 -- --  INR -- -- 1.17 -- -- -- -- -- 1.04 -- --  HEPARINUNFRC 0.28* 0.33 -- -- 0.29* -- -- -- -- -- --  CREATININE -- -- 1.12 -- -- -- -- 1.16 -- 1.20 --  CKTOTAL -- -- -- 112 -- 154 197 -- -- -- --  CKMB -- -- -- 1.0 -- 1.4 1.8 -- -- -- --  TROPONINI -- -- -- <0.30 -- <0.30 <0.30 -- -- -- --   Estimated Creatinine Clearance: 86.4 ml/min (by C-G formula based on Cr of 1.12).   Medications:  Scheduled:    . docusate sodium  100 mg Oral BID  . oxyCODONE      . patient's guide to using coumadin book   Does not apply Once  . warfarin  7.5 mg Oral ONCE-1800  . warfarin   Does not apply Once   Infustions:    . heparin 1,550 Units/hr (12/22/10 0000)   PRN: acetaminophen, albuterol, alum & mag hydroxide-simeth, ipratropium, morphine, ondansetron (ZOFRAN) IV, oxyCODONE, senna, sodium chloride, zolpidem  Assessment: 35 YO male with PE+DVT. Heparin level below goal 0.3-0.7. INR progressing towards goal 2-3. Heparin currently running at 1550 units/hr. RN reported nose bleed about 5am - no orders received to hold anticoag, so will proceed.   Goal of Therapy:  INR 2-3 Heparin level 0.3-0.7 units/ml   Plan:  1)   Increase heparin to 1650 units/hr (16.5 ml/hr) 2)  Repeat warfarin 7.5mg  PO x1 at 18:00 3)  Heparin level at 20:00 tonight. 4) Pharmacy will f/u on tonight's heparin level.  [name] 12/22/2010,1:06 PM

## 2010-12-22 NOTE — Progress Notes (Signed)
ANTICOAGULATION CONSULT NOTE - Follow Up Consult  Pharmacy Consult for  Indication: pulmonary embolus and DVT  No Known Allergies  Patient Measurements: Height: 5\' 4"  (162.6 cm) Weight: 173 lb 8 oz (78.7 kg) IBW/kg (Calculated) : 59.2   Vital Signs: Temp: 98.3 F (36.8 C) (11/05 2130) Temp src: Oral (11/05 2130) BP: 149/94 mmHg (11/05 2130) Pulse Rate: 89  (11/05 2130)  Labs:  Basename 12/22/10 1955 12/22/10 1145 12/22/10 0423 12/22/10 0422 12/21/10 2237 12/21/10 1545 12/21/10 1147 12/21/10 0948 12/21/10 0552 12/21/10 0210 12/21/10 0050  HGB -- -- -- 13.2 -- -- -- -- -- 17.3* --  HCT -- -- -- 39.6 -- -- -- -- -- 51.0 44.2  PLT -- -- -- 191 -- -- -- -- -- -- 197  APTT -- -- -- 110* -- -- -- -- -- -- --  LABPROT -- -- -- 15.1 -- -- -- -- 13.8 -- --  INR -- -- -- 1.17 -- -- -- -- 1.04 -- --  HEPARINUNFRC 0.29* 0.28* 0.33 -- -- -- -- -- -- -- --  CREATININE -- -- -- 1.12 -- -- -- 1.16 -- 1.20 --  CKTOTAL -- -- -- -- 112 154 197 -- -- -- --  CKMB -- -- -- -- 1.0 1.4 1.8 -- -- -- --  TROPONINI -- -- -- -- <0.30 <0.30 <0.30 -- -- -- --   Estimated Creatinine Clearance: 86.4 ml/min (by C-G formula based on Cr of 1.12).   Medications:  Heparin 1650 units/hr  Assessment:  Heparin level 0.29 from 0.28 after rate increase of 100 units/hr  No bleeding noted, sticky note from this am incorrect (was written on wrong patient) Goal of Therapy:  Heparin level 0.3-0.7 units/ml   Plan:  Will increase rate to 1850 units/hr, recheck hep level in am. Otho Bellows 12/22/2010 10:16 PM      Chilton Si, Camelia Eng L 12/22/2010,10:04 PM

## 2010-12-22 NOTE — Progress Notes (Signed)
Physical Therapy Note  Order received. Chart reviewed. Attempted PT evaluation. Pt declined to participate due to severe pain. Will check back another day as schedule permits. Thanks.

## 2010-12-22 NOTE — Consult Note (Signed)
ANTICOAGULATION CONSULT NOTE - Follow Up Consult  Pharmacy Consult for heparin Indication: DVT  No Known Allergies  Patient Measurements: Height: 5\' 4"  (162.6 cm) Weight: 175 lb 7.8 oz (79.6 kg) IBW/kg (Calculated) : 59.2  Adjusted Body Weight:   Vital Signs: Temp: 99.6 F (37.6 C) (11/05 0235) Temp src: Oral (11/05 0235) BP: 116/77 mmHg (11/05 0235) Pulse Rate: 97  (11/05 0235)  Labs:  Alvira Philips 12/22/10 0423 12/22/10 0422 12/21/10 2237 12/21/10 2230 12/21/10 1545 12/21/10 1147 12/21/10 0948 12/21/10 0552 12/21/10 0210 12/21/10 0050  HGB -- 13.2 -- -- -- -- -- -- 17.3* --  HCT -- 39.6 -- -- -- -- -- -- 51.0 44.2  PLT -- 191 -- -- -- -- -- -- -- 197  APTT -- 110* -- -- -- -- -- -- -- --  LABPROT -- 15.1 -- -- -- -- -- 13.8 -- --  INR -- 1.17 -- -- -- -- -- 1.04 -- --  HEPARINUNFRC 0.33 -- -- 0.29* 0.37 -- -- -- -- --  CREATININE -- 1.12 -- -- -- -- 1.16 -- 1.20 --  CKTOTAL -- -- 112 -- 154 197 -- -- -- --  CKMB -- -- 1.0 -- 1.4 1.8 -- -- -- --  TROPONINI -- -- <0.30 -- <0.30 <0.30 -- -- -- --   Estimated Creatinine Clearance: 86.9 ml/min (by C-G formula based on Cr of 1.12).   Medications:   Assessment: Heparin level at goal (0.33), but drawn a little early.    Goal of Therapy:  Heparin level 0.3-0.7 units/ml   Plan:   Will continue with rate of 1550 units/hr and recheck level at 1100.   Darlina Guys, Jacquenette Shone Crowford 12/22/2010,5:47 AM

## 2010-12-22 NOTE — Progress Notes (Signed)
Pt had an episode of shallow breathing, c/o pain 10/10 on pain scale on left side of chest and arm consistent with previous pain felt in that area. O2 applied, sats 93%, HR 114, BP 149/91, temp 102.6. Dr Rama notified, orders obtained, pain med given, will continue to monitor. Tom Dalton

## 2010-12-22 NOTE — Progress Notes (Signed)
[id number]/Tom Dalton/Utilization review of Chart performed. 

## 2010-12-22 NOTE — Progress Notes (Signed)
Subjective: The patient is markedly dyspneic and complaining of pleuritic-type chest pain that worsens with deep inspiration. He is anxious and I have been called by the nursing staff do to his worsening symptoms. The patient denies pain anywhere else. He has been febrile.  Objective:  Vital signs in last 24 hours:  Filed Vitals:   12/22/10 0649 12/22/10 1035 12/22/10 1300 12/22/10 1431  BP: 108/72 125/83 154/80 119/80  Pulse: 91 95 110   Temp: 100 F (37.8 C) 100.5 F (38.1 C) 100.7 F (38.2 C) 102.6 F (39.2 C)  TempSrc: Oral Oral Oral Oral  Resp: 18 18 18    Height:      Weight: 78.7 kg (173 lb 8 oz)     SpO2: 97% 97% 91%     Intake/Output from previous day:   Intake/Output Summary (Last 24 hours) at 12/22/10 1533 Last data filed at 12/22/10 1300  Gross per 24 hour  Intake 583.13 ml  Output    425 ml  Net 158.13 ml    Physical Exam: General Appearance:    Alert, cooperative, anxious, appears stated age  Lungs:     Diminished to auscultation bilaterally, respirations mildly labored   Heart:    Regular rate and rhythm, S1 and S2 normal, no murmur, rub   or gallop  Abdomen:     Soft, non-tender, bowel sounds active all four quadrants,    no masses, no organomegaly  Extremities:   No cyanosis or edema.  Mild clubbing.  Pulses:   2+ and symmetric all extremities    Lab Results:  Basic Metabolic Panel:    Component Value Date/Time   NA 133* 12/22/2010 0422   K 3.7 12/22/2010 0422   CL 95* 12/22/2010 0422   CO2 28 12/22/2010 0422   BUN 15 12/22/2010 0422   CREATININE 1.12 12/22/2010 0422   GLUCOSE 116* 12/22/2010 0422   CALCIUM 9.5 12/22/2010 0422   CBC:    Component Value Date/Time   WBC 12.1* 12/22/2010 0422   HGB 13.2 12/22/2010 0422   HCT 39.6 12/22/2010 0422   PLT 191 12/22/2010 0422   MCV 73.7* 12/22/2010 0422   NEUTROABS 7.6 03/08/2009 1550   LYMPHSABS 1.4 03/08/2009 1550   MONOABS 0.5 03/08/2009 1550   EOSABS 0.0 03/08/2009 1550   BASOSABS 0.0 03/08/2009 1550      No results found for this or any previous visit (from the past 240 hour(s)).  Studies/Results: Dg Chest 2 View  12/21/2010  *RADIOLOGY REPORT*  Clinical Data: Chest pain  CHEST - 2 VIEW  Comparison: None.  Findings: Mild bibasilar and lingular opacities.  Heart size upper normal limits to mildly enlarged. Mediastinal contours otherwise within normal limits. No pleural effusion or pneumothorax.  No acute osseous abnormality.  IMPRESSION: Bibasilar and lingular opacities; atelectasis versus infiltrate.  Original Report Authenticated By: Waneta Martins, M.D.   Ct Angio Chest W/cm &/or Wo Cm  12/21/2010  *RADIOLOGY REPORT*  Clinical Data:  Chest pain and elevated D-dimer  CT ANGIOGRAPHY CHEST WITH CONTRAST  Technique:  Multidetector CT imaging of the chest was performed using the standard protocol during bolus administration of intravenous contrast.  Multiplanar CT image reconstructions including MIPs were obtained to evaluate the vascular anatomy.  Contrast: OMNIPAQUE IOHEXOL 300 MG/ML IV SOLN  Comparison:  12/21/2010 radiograph  Findings:  Bilateral lobar are and segmental branch pulmonary emboli, with the largest clot burden is supplying the lower lobes. Heart size within normal limits.  No overt evidence for right  ventricular heart strain.  No pericardial or pleural effusion.  No intrathoracic lymphadenopathy.  Normal course and caliber aorta.  Limited images through the upper abdomen show no acute abnormality.  Central airways are patent.  There are bibasilar and lingular opacities which may reflect atelectasis or infarct.  No pneumothorax.  No acute osseous abnormality.  Review of the MIP images confirms the above findings.  IMPRESSION: Bilateral pulmonary emboli within lobar and segmental branches.  No overt evidence for right ventricular heart strain.  Bibasilar and linear opacities may reflect atelectasis or infarcts.  Coronary artery calcification.  Findings discussed via telephone with  Dr. Hyacinth Meeker at 05:00 a.m. on 12/21/2010  Original Report Authenticated By: Waneta Martins, M.D.    Medications: Scheduled Meds:   . docusate sodium  100 mg Oral BID  .  morphine injection  2 mg Intravenous NOW  . oxyCODONE      . patient's guide to using coumadin book   Does not apply Once  . warfarin  7.5 mg Oral ONCE-1800  . warfarin  7.5 mg Oral ONCE-1800  . warfarin   Does not apply Once   Continuous Infusions:   . heparin 1,550 Units/hr (12/22/10 0000)   PRN Meds:.acetaminophen, albuterol, alum & mag hydroxide-simeth, ipratropium, morphine, ondansetron (ZOFRAN) IV, oxyCODONE, senna, sodium chloride, zolpidem  Assessment/Plan:  Principal Problem:  *Pulmonary embolism: Unclear trigger. The patient is currently on Coumadin and heparin per pharmacy protocol. Will repeat a chest x-ray and obtain an arterial blood gas given the patient's decompensation. Active Problems:  Fever: This may be due to pulmonary infarction but cannot rule out a superimposed pneumonia. I have ordered a stat chest x-ray and will prescribe an antibiotic if there is any indication that he is developing an acute pneumonia. He does have a mild leukocytosis which is also consistent with pulmonary infarction. We'll start him on anti-inflammatory medicine (Toradol).  Hyponatremia: Mild. We'll monitor.  Leukocytosis: , Maybe secondary to pulmonary infarction versus a developing pneumonia. I have requested a stat chest x-ray and we will followup on a CBC in the morning.    LOS: 1 day   RAMA,CHRISTINA 12/22/2010, 3:33 PM

## 2010-12-23 DIAGNOSIS — R0902 Hypoxemia: Secondary | ICD-10-CM | POA: Diagnosis present

## 2010-12-23 LAB — CBC
HCT: 40.2 % (ref 39.0–52.0)
Hemoglobin: 13.4 g/dL (ref 13.0–17.0)
MCHC: 33.3 g/dL (ref 30.0–36.0)
RBC: 5.49 MIL/uL (ref 4.22–5.81)

## 2010-12-23 LAB — PROTIME-INR
INR: 1.55 — ABNORMAL HIGH (ref 0.00–1.49)
Prothrombin Time: 18.9 seconds — ABNORMAL HIGH (ref 11.6–15.2)

## 2010-12-23 LAB — HEPARIN LEVEL (UNFRACTIONATED): Heparin Unfractionated: 0.39 IU/mL (ref 0.30–0.70)

## 2010-12-23 LAB — BASIC METABOLIC PANEL
Calcium: 9.8 mg/dL (ref 8.4–10.5)
GFR calc Af Amer: 70 mL/min — ABNORMAL LOW (ref >90)
GFR calc non Af Amer: 60 mL/min — ABNORMAL LOW (ref >90)
Glucose, Bld: 102 mg/dL — ABNORMAL HIGH (ref 70–99)
Potassium: 3.7 mEq/L (ref 3.5–5.1)
Sodium: 134 mEq/L — ABNORMAL LOW (ref 135–145)

## 2010-12-23 MED ORDER — COUMADIN BOOK
Freq: Once | Status: AC
  Administered 2010-12-23: 18:00:00
  Filled 2010-12-23: qty 1

## 2010-12-23 MED ORDER — WARFARIN SODIUM 7.5 MG PO TABS
7.5000 mg | ORAL_TABLET | Freq: Once | ORAL | Status: AC
  Administered 2010-12-23: 7.5 mg via ORAL
  Filled 2010-12-23: qty 1

## 2010-12-23 MED ORDER — MOXIFLOXACIN HCL 400 MG PO TABS
400.0000 mg | ORAL_TABLET | ORAL | Status: DC
  Administered 2010-12-23 – 2010-12-25 (×3): 400 mg via ORAL
  Filled 2010-12-23 (×5): qty 1

## 2010-12-23 MED ORDER — PNEUMOCOCCAL VAC POLYVALENT 25 MCG/0.5ML IJ INJ
0.5000 mL | INJECTION | INTRAMUSCULAR | Status: AC
  Administered 2010-12-24: 0.5 mL via INTRAMUSCULAR
  Filled 2010-12-23 (×2): qty 0.5

## 2010-12-23 NOTE — Progress Notes (Signed)
ANTICOAGULATION CONSULT NOTE - Follow Up Consult  Pharmacy Consult for Warfarin and Heparin Indication: PE and DVT  No Known Allergies  Patient Measurements: Height: 5\' 4"  (162.6 cm) Weight: 172 lb 6.4 oz (78.2 kg) IBW/kg (Calculated) : 59.2   Vital Signs: Temp: 99.5 F (37.5 C) (11/06 0611) Temp src: Oral (11/06 0611) BP: 127/77 mmHg (11/06 0611) Pulse Rate: 90  (11/06 0611)  Labs:  Tom Dalton 12/23/10 0455 12/22/10 1955 12/22/10 1145 12/22/10 0422 12/21/10 2237 12/21/10 1545 12/21/10 1147 12/21/10 0948 12/21/10 0552 12/21/10 0210 12/21/10 0050  HGB 13.4 -- -- 13.2 -- -- -- -- -- -- --  HCT 40.2 -- -- 39.6 -- -- -- -- -- 51.0 --  PLT 212 -- -- 191 -- -- -- -- -- -- 197  APTT 131* -- -- 110* -- -- -- -- -- -- --  LABPROT 18.9* -- -- 15.1 -- -- -- -- 13.8 -- --  INR 1.55* -- -- 1.17 -- -- -- -- 1.04 -- --  HEPARINUNFRC 0.39 0.29* 0.28* -- -- -- -- -- -- -- --  CREATININE 1.46* -- -- 1.12 -- -- -- 1.16 -- -- --  CKTOTAL -- -- -- -- 112 154 197 -- -- -- --  CKMB -- -- -- -- 1.0 1.4 1.8 -- -- -- --  TROPONINI -- -- -- -- <0.30 <0.30 <0.30 -- -- -- --   Estimated Creatinine Clearance: 66.1 ml/min (by C-G formula based on Cr of 1.46).   Medications:  Scheduled:    . docusate sodium  100 mg Oral BID  . ketorolac  30 mg Intravenous Q6H  .  morphine injection  2 mg Intravenous NOW  . warfarin  7.5 mg Oral ONCE-1800  . warfarin   Does not apply Once   Infustions:    . heparin 1,850 Units/hr (12/23/10 0646)  . DISCONTD: heparin 1,650 Units/hr (12/22/10 2000)   PRN: acetaminophen, albuterol, alum & mag hydroxide-simeth, ipratropium, LORazepam, morphine, ondansetron (ZOFRAN) IV, oxyCODONE, senna, sodium chloride, zolpidem, DISCONTD: albuterol  Assessment: Day #3 of 5 day minimum overlap with heparin/warfarin for new PE+DVT (started 12/21/10). Heparin level therapeutic. INR progressing towards goal 2-3. Heparin level therapeutic.  No further mention of bleeding or nose bleeds  reported.  Goal of Therapy:  INR 2-3 Heparin level 0.3-0.7 units/ml   Plan:  1) Continue heparin at 1850 units/hr. (18.5 ml/hr). 2) Repeat warfarin 7.5mg  PO x1 at 18:00. 3) F/U daily heparin levels and PT/INRs. 4) Continue heparin for 2 more days minimum, AND until INR > 2 x2 consecutive days.  Tom Dalton 12/23/2010,8:28 AM

## 2010-12-23 NOTE — Progress Notes (Signed)
Subjective: Tom Dalton feels a little better today. He is not nearly as dyspneic as he was yesterday. He still reporting left chest and flank pain that worsens with deep inspiration. He states that he has been up and ambulated without any difficulty. He is reporting some ongoing intermittent fevers. He reports his appetite is good and that he has not had any problems with diarrhea or other bowel issues.  Objective: Vital signs in last 24 hours:  Filed Vitals:   12/23/10 0611 12/23/10 1009 12/23/10 1014 12/23/10 1030  BP: 127/77 102/69    Pulse: 90 94 96 98  Temp: 99.5 F (37.5 C) 99.6 F (37.6 C)    TempSrc: Oral Oral    Resp: 24 20    Height:      Weight: 78.2 kg (172 lb 6.4 oz)     SpO2: 90% 98% 98% 92%    Intake/Output from previous day:   Intake/Output Summary (Last 24 hours) at 12/23/10 1207 Last data filed at 12/23/10 0900  Gross per 24 hour  Intake   1113 ml  Output    425 ml  Net    688 ml    Physical Exam: General Appearance:    Alert, cooperative, no distress, appears stated age  Lungs:     Decreased breath sounds, left greater than right , respirations unlabored   Heart:    Regular rate and rhythm, S1 and S2 normal, no murmur, rub   or gallop  Abdomen:     Soft, non-tender, bowel sounds active all four quadrants,    no masses, no organomegaly  Extremities:   Extremities normal, atraumatic, no cyanosis or edema  Pulses:   2+ and symmetric all extremities    Lab Results:  Basic Metabolic Panel:    Component Value Date/Time   NA 134* 12/23/2010 0455   K 3.7 12/23/2010 0455   CL 93* 12/23/2010 0455   CO2 31 12/23/2010 0455   BUN 23 12/23/2010 0455   CREATININE 1.46* 12/23/2010 0455   GLUCOSE 102* 12/23/2010 0455   CALCIUM 9.8 12/23/2010 0455   CBC:    Component Value Date/Time   WBC 11.2* 12/23/2010 0455   HGB 13.4 12/23/2010 0455   HCT 40.2 12/23/2010 0455   PLT 212 12/23/2010 0455   MCV 73.2* 12/23/2010 0455   NEUTROABS 7.6 03/08/2009 1550   LYMPHSABS 1.4  03/08/2009 1550   MONOABS 0.5 03/08/2009 1550   EOSABS 0.0 03/08/2009 1550   BASOSABS 0.0 03/08/2009 1550    No results found for this or any previous visit (from the past 240 hour(s)).  Studies/Results: Dg Chest Portable 1 View  12/22/2010  *RADIOLOGY REPORT*  Clinical Data: Chest pain, shortness of breath, fever  PORTABLE CHEST - 1 VIEW  Comparison: Portable exam 1527 hours compared to 12/21/2010  Findings: Decreased lung volumes. Mild enlargement of cardiac silhouette. Mediastinal contours normal. Pulmonary vascular congestion. Mild persistent right basilar atelectasis. Increased opacity at left lung base likely representing increased consolidation and associated left pleural effusion. Persistent decrease in lung volumes. No pneumothorax or acute osseous findings.  IMPRESSION: Persistent decreased lung volumes right basilar atelectasis. Increased left lower lobe consolidation with small associated pleural effusion.  Original Report Authenticated By: Tom Dalton, M.D.    Medications: Scheduled Meds:   . coumadin book   Does not apply Once  . docusate sodium  100 mg Oral BID  . ketorolac  30 mg Intravenous Q6H  .  morphine injection  2 mg Intravenous NOW  .  warfarin  7.5 mg Oral ONCE-1800  . warfarin  7.5 mg Oral ONCE-1800   Continuous Infusions:   . heparin 1,850 Units/hr (12/23/10 0646)  . DISCONTD: heparin 1,650 Units/hr (12/22/10 2000)   PRN Meds:.acetaminophen, albuterol, alum & mag hydroxide-simeth, ipratropium, LORazepam, morphine, ondansetron (ZOFRAN) IV, oxyCODONE, senna, sodium chloride, zolpidem, DISCONTD: albuterol  Assessment/Plan:  Principal Problem:  *Pulmonary embolism: Unclear trigger. He will need a hypercoagulable profile after he finishes his current course of anticoagulation therapy. He remains on a heparin infusion and Coumadin per pharmacy dosing.  His INR today was 1.55. Active Problems:  Fever: This is most likely due to pulmonary infarction.  He has a  low-grade fever and leukocytosis. His symptoms seem to have improved with empiric Toradol therapy. A chest radiograph was done yesterday does show some consolidation and worsening opacity on the left side, where his pain is.  I will go ahead and empirically start him on Avelox to cover pneumonia.  Hypoxia: An arterial blood gas was done yesterday because of the patient's worsening symptoms. He was hypoxic and has responded to supplemental oxygen therapy to maintain his oxygen saturations.  Hyponatremia: Mild. We'll monitor.  Leukocytosis: May be secondary to pulmonary infarction versus a developing pneumonia. I have started him on Avelox to address the possibility of underlying pulmonary infection.  Chest pain syndrome: Patient is on p.r.n pain medicines for pain control.    LOS: 2 days   Tom Dalton,Tom Dalton 12/23/2010, 12:07 PM

## 2010-12-23 NOTE — Progress Notes (Signed)
TALKED TO PATIENT ABOUT FOLLOW UP MEDICAL CARE; PATIENT IS AGREED TO F/U WITH [facility]; ELIGIBILITY APT IS Feb 18, 2011 AND APT WITH DR Clelia Croft WITH [facility] IS Feb 27, 2011 AT 1045; INFORMATION GIVEN TO THE PATIENT;

## 2010-12-23 NOTE — Progress Notes (Signed)
Pharmacy Note - Warfarin Education  Patient educated on use of warfarin. Pt expressed verbal understanding. All questions answered. Pt had no further questions at this time. Instructed pt to inform nurse if he thought of any other questions a pharmacist can come back to re-educate.

## 2010-12-24 ENCOUNTER — Encounter (HOSPITAL_COMMUNITY): Payer: Self-pay | Admitting: Internal Medicine

## 2010-12-24 DIAGNOSIS — N179 Acute kidney failure, unspecified: Secondary | ICD-10-CM

## 2010-12-24 HISTORY — DX: Acute kidney failure, unspecified: N17.9

## 2010-12-24 LAB — PROTIME-INR
INR: 2.56 — ABNORMAL HIGH (ref 0.00–1.49)
Prothrombin Time: 27.9 seconds — ABNORMAL HIGH (ref 11.6–15.2)

## 2010-12-24 LAB — APTT: aPTT: >200 seconds (ref 24–37)

## 2010-12-24 LAB — HEPARIN LEVEL (UNFRACTIONATED): Heparin Unfractionated: 0.34 IU/mL (ref 0.30–0.70)

## 2010-12-24 LAB — BASIC METABOLIC PANEL
BUN: 17 mg/dL (ref 6–23)
Calcium: 9.5 mg/dL (ref 8.4–10.5)
Creatinine, Ser: 1.36 mg/dL — ABNORMAL HIGH (ref 0.50–1.35)
GFR calc Af Amer: 76 mL/min — ABNORMAL LOW (ref >90)
GFR calc non Af Amer: 66 mL/min — ABNORMAL LOW (ref >90)
Glucose, Bld: 104 mg/dL — ABNORMAL HIGH (ref 70–99)
Potassium: 4.1 mEq/L (ref 3.5–5.1)

## 2010-12-24 LAB — CBC
Hemoglobin: 11.8 g/dL — ABNORMAL LOW (ref 13.0–17.0)
MCH: 24.4 pg — ABNORMAL LOW (ref 26.0–34.0)
MCHC: 33.1 g/dL (ref 30.0–36.0)
RDW: 13 % (ref 11.5–15.5)

## 2010-12-24 MED ORDER — HEPARIN (PORCINE) IN NACL 100-0.45 UNIT/ML-% IJ SOLN
1850.0000 [IU]/h | INTRAMUSCULAR | Status: DC
Start: 2010-12-24 — End: 2010-12-26
  Administered 2010-12-24 – 2010-12-26 (×3): 1850 [IU]/h via INTRAVENOUS
  Filled 2010-12-24 (×8): qty 250

## 2010-12-24 MED ORDER — HYDROCOD POLST-CHLORPHEN POLST 10-8 MG/5ML PO LQCR
5.0000 mL | Freq: Two times a day (BID) | ORAL | Status: DC | PRN
  Administered 2010-12-26: 5 mL via ORAL
  Filled 2010-12-24: qty 5

## 2010-12-24 NOTE — Progress Notes (Signed)
ANTICOAGULATION CONSULT NOTE - Follow Up Consult  Pharmacy Consult for Heparin  Indication: PE and DVT  No Known Allergies  Patient Measurements: Height: 5\' 4"  (162.6 cm) Weight: 172 lb 6.4 oz (78.2 kg) IBW/kg (Calculated) : 59.2   Vital Signs: Temp: 98 F (36.7 C) (11/07 0700) Temp src: Oral (11/07 0700) BP: 112/71 mmHg (11/07 0700) Pulse Rate: 56  (11/07 0700)  Labs:  Basename 12/24/10 0535 12/23/10 0455 12/22/10 1955 12/22/10 0422 12/21/10 2237 12/21/10 1545 12/21/10 1147  HGB 11.8* 13.4 -- -- -- -- --  HCT 35.6* 40.2 -- 39.6 -- -- --  PLT 232 212 -- 191 -- -- --  APTT >200* 131* -- 110* -- -- --  LABPROT 27.9* 18.9* -- 15.1 -- -- --  INR 2.56* 1.55* -- 1.17 -- -- --  HEPARINUNFRC 0.28* 0.39 0.29* -- -- -- --  CREATININE 1.36* 1.46* -- 1.12 -- -- --  CKTOTAL -- -- -- -- 112 154 197  CKMB -- -- -- -- 1.0 1.4 1.8  TROPONINI -- -- -- -- <0.30 <0.30 <0.30   Estimated Creatinine Clearance: 70.9 ml/min (by C-G formula based on Cr of 1.36).   Medications:  Scheduled:    . coumadin book   Does not apply Once  . docusate sodium  100 mg Oral BID  . ketorolac  30 mg Intravenous Q6H  . moxifloxacin  400 mg Oral Q24H  . pneumococcal 23 valent vaccine  0.5 mL Intramuscular Tomorrow-1000  . warfarin  7.5 mg Oral ONCE-1800   Infustions:    . DISCONTD: heparin 1,850 Units/hr (12/23/10 2140)    Assessment: APTT >200 this am but heparin level is actually below target range.  No bleeding reported.   Goal of Therapy:  INR 2-3 Heparin level 0.3-0.7 units/ml   Plan:  Continue current heparin infusion at 1850units/hr and check a heparin level later today to verify.  Discontinue aPTTs as this is not used to measure heparin under our current protocol.  Reece Packer 12/24/2010,7:03 AM

## 2010-12-24 NOTE — Progress Notes (Signed)
Subjective: Tom Dalton continues to report improvement in his dyspnea and pleuritic type chest pain. He is coughing and reports that when he coughs, it aggravates his pain. He did report an episode of bloody nose last night. Overall, he does feel better. Objective: Vital signs in last 24 hours:  Filed Vitals:   12/23/10 1726 12/23/10 2148 12/24/10 0239 12/24/10 0700  BP: 124/71 118/77  112/71  Pulse: 98 84  56  Temp: 99.9 F (37.7 C) 98.6 F (37 C) 99 F (37.2 C) 98 F (36.7 C)  TempSrc: Oral Oral Oral Oral  Resp: 20 24  20   Height:      Weight:    78.2 kg (172 lb 6.4 oz)  SpO2: 98% 97%  99%    Intake/Output from previous day:   Intake/Output Summary (Last 24 hours) at 12/24/10 1017 Last data filed at 12/24/10 0900  Gross per 24 hour  Intake    767 ml  Output      0 ml  Net    767 ml    Physical Exam: General Appearance:    Alert, cooperative, no distress, appears stated age  Lungs:     Decreased breath sounds, crackles left base.   Heart:    Regular rate and rhythm, S1 and S2 normal, no murmur, rub   or gallop  Abdomen:     Soft, non-tender, bowel sounds active all four quadrants,    no masses, no organomegaly  Extremities:   Extremities normal, atraumatic, no cyanosis or edema  Pulses:   2+ and symmetric all extremities    Lab Results:  Basic Metabolic Panel:    Component Value Date/Time   NA 134* 12/24/2010 0535   K 4.1 12/24/2010 0535   CL 96 12/24/2010 0535   CO2 31 12/24/2010 0535   BUN 17 12/24/2010 0535   CREATININE 1.36* 12/24/2010 0535   GLUCOSE 104* 12/24/2010 0535   CALCIUM 9.5 12/24/2010 0535   CBC:    Component Value Date/Time   WBC 9.6 12/24/2010 0535   HGB 11.8* 12/24/2010 0535   HCT 35.6* 12/24/2010 0535   PLT 232 12/24/2010 0535   MCV 73.6* 12/24/2010 0535   NEUTROABS 7.6 03/08/2009 1550   LYMPHSABS 1.4 03/08/2009 1550   MONOABS 0.5 03/08/2009 1550   EOSABS 0.0 03/08/2009 1550   BASOSABS 0.0 03/08/2009 1550    No results found for this or any  previous visit (from the past 240 hour(s)).  Studies/Results: Dg Chest Portable 1 View  12/22/2010  *RADIOLOGY REPORT*  Clinical Data: Chest pain, shortness of breath, fever  PORTABLE CHEST - 1 VIEW  Comparison: Portable exam 1527 hours compared to 12/21/2010  Findings: Decreased lung volumes. Mild enlargement of cardiac silhouette. Mediastinal contours normal. Pulmonary vascular congestion. Mild persistent right basilar atelectasis. Increased opacity at left lung base likely representing increased consolidation and associated left pleural effusion. Persistent decrease in lung volumes. No pneumothorax or acute osseous findings.  IMPRESSION: Persistent decreased lung volumes right basilar atelectasis. Increased left lower lobe consolidation with small associated pleural effusion.  Original Report Authenticated By: Lollie Marrow, M.D.    Medications: Scheduled Meds:    . coumadin book   Does not apply Once  . docusate sodium  100 mg Oral BID  . ketorolac  30 mg Intravenous Q6H  . moxifloxacin  400 mg Oral Q24H  . pneumococcal 23 valent vaccine  0.5 mL Intramuscular Tomorrow-1000  . warfarin  7.5 mg Oral ONCE-1800   Continuous Infusions:    .  heparin    . DISCONTD: heparin 1,850 Units/hr (12/23/10 2140)   PRN Meds:.acetaminophen, albuterol, alum & mag hydroxide-simeth, ipratropium, LORazepam, morphine, ondansetron (ZOFRAN) IV, oxyCODONE, senna, sodium chloride, zolpidem  Assessment/Plan:  Principal Problem:  *Pulmonary embolism: Unclear trigger. He will need a hypercoagulable profile after he finishes his current course of anticoagulation therapy. He remains on a heparin infusion and Coumadin per pharmacy dosing.  His INR today is therapeutic at 2.56.  We'll add Tussionex for cough. Active Problems:  Fever: This is most likely due to pulmonary infarction.  He has a low-grade fever and leukocytosis. His symptoms seem to have improved with empiric Toradol therapy. A chest radiograph was  done yesterday does show some consolidation and worsening opacity on the left side, where his pain is.  He is currently on day #2 of Avelox therapy, empirically.  Hypoxia: He is currently maintaining his oxygen saturations well. Acute Renal Failure: We'll discontinue Toradol and monitor.  Hyponatremia: Mild. We'll monitor.  Leukocytosis: May be secondary to pulmonary infarction versus a developing pneumonia. I have started him on Avelox to address the possibility of underlying pulmonary infection.  His white blood cell count has normalized on empiric Avelox.  Chest pain syndrome: Patient is on p.r.n pain medicines for pain control.    LOS: 3 days   Tom Dalton 12/24/2010, 10:17 AM

## 2010-12-24 NOTE — Progress Notes (Signed)
CRITICAL VALUE ALERT  Critical value received:  ptt  Date of notification:  12/24/10  Time of notification:  0628  Critical value read back:yes  Nurse who received alert:  Ananias Pilgrim   MD notified (1st page):  [place], K.  Time of first page:  0630    MD notified (2nd page):  Time of second [id number]  Responding MD:  Black, Kirtland Bouchard.  Time MD responded:  [phone]

## 2010-12-24 NOTE — Progress Notes (Signed)
ANTICOAGULATION CONSULT NOTE - Follow Up Consult  Pharmacy Consult for Warfarin and Heparin Indication: PE and DVT  No Known Allergies  Patient Measurements: Height: 5\' 4"  (162.6 cm) Weight: 172 lb 6.4 oz (78.2 kg) IBW/kg (Calculated) : 59.2   Vital Signs: Temp: 99 F (37.2 C) (11/07 1300) Temp src: Oral (11/07 0700) BP: 112/71 mmHg (11/07 0700) Pulse Rate: 56  (11/07 0700)  Labs:  Basename 12/24/10 1142 12/24/10 0535 12/23/10 0455 12/22/10 0422 12/21/10 2237 12/21/10 1545  HGB -- 11.8* 13.4 -- -- --  HCT -- 35.6* 40.2 39.6 -- --  PLT -- 232 212 191 -- --  APTT -- >200* 131* 110* -- --  LABPROT -- 27.9* 18.9* 15.1 -- --  INR -- 2.56* 1.55* 1.17 -- --  HEPARINUNFRC 0.34 0.28* 0.39 -- -- --  CREATININE -- 1.36* 1.46* 1.12 -- --  CKTOTAL -- -- -- -- 112 154  CKMB -- -- -- -- 1.0 1.4  TROPONINI -- -- -- -- <0.30 <0.30   Estimated Creatinine Clearance: 70.9 ml/min (by C-G formula based on Cr of 1.36).   Medications:  Scheduled:     . coumadin book   Does not apply Once  . docusate sodium  100 mg Oral BID  . moxifloxacin  400 mg Oral Q24H  . pneumococcal 23 valent vaccine  0.5 mL Intramuscular Tomorrow-1000  . warfarin  7.5 mg Oral ONCE-1800  . DISCONTD: ketorolac  30 mg Intravenous Q6H   Infustions:     . heparin 1,850 Units/hr (12/24/10 1314)  . DISCONTD: heparin 1,850 Units/hr (12/23/10 2140)   PRN: acetaminophen, albuterol, alum & mag hydroxide-simeth, chlorpheniramine-HYDROcodone, ipratropium, LORazepam, morphine, ondansetron (ZOFRAN) IV, oxyCODONE, senna, sodium chloride, zolpidem  Assessment: Day #4 of 5 day minimum overlap with heparin/warfarin for new PE+DVT (started 12/21/10). Heparin level therapeutic. INR progressing towards goal 2-3. Heparin level therapeutic (0.34). Heparin continues to infuse at 1850 units/hr.  INR jumped up overnight (1.55 -->2.56). Now in goal range 2-3. Pt reported episode of bloody nose last night (per MD note).  Has had warfarin  7.5mg  x3 doses so far. Given large jump in INR, will hold tonight's warfarin to slow INR trend.  Goal of Therapy:  INR 2-3 Heparin level 0.3-0.7 units/ml   Plan:  1) Continue heparin at 1850 units/hr. (18.5 ml/hr). 2) No warfarin tonight. 3) F/U daily heparin levels and PT/INRs. 4) Continue heparin for 1 more day AND until INR > 2 x2 consecutive days.  [name] 12/24/2010,1:15 PM

## 2010-12-25 LAB — PROTIME-INR: Prothrombin Time: 27.4 seconds — ABNORMAL HIGH (ref 11.6–15.2)

## 2010-12-25 MED ORDER — WARFARIN SODIUM 5 MG PO TABS
5.0000 mg | ORAL_TABLET | Freq: Once | ORAL | Status: AC
  Administered 2010-12-25: 5 mg via ORAL
  Filled 2010-12-25: qty 1

## 2010-12-25 NOTE — Progress Notes (Signed)
Subjective: Mr. Thielen is having less dyspnea and less pleuritic chest pain.  He has not had any further issues with nosebleeding, and denies blood in the urine or stool.  He does have some mild hemoptysis and cough.  Objective: Vital signs in last 24 hours:  Filed Vitals:   12/24/10 2202 12/25/10 0233 12/25/10 0454 12/25/10 0920  BP: 138/82 133/78 119/75 127/75  Pulse: 85 99 80 83  Temp: 97.3 F (36.3 C) 100.1 F (37.8 C) 98.6 F (37 C) 98.4 F (36.9 C)  TempSrc: Oral Oral Oral Axillary  Resp: 20 24 20 20   Height:      Weight:   77.8 kg (171 lb 8.3 oz)   SpO2: 95% 93% 94% 95%    Intake/Output from previous day:   Intake/Output Summary (Last 24 hours) at 12/25/10 1114 Last data filed at 12/25/10 1610  Gross per 24 hour  Intake 1062.19 ml  Output   1200 ml  Net -137.81 ml    Physical Exam: General Appearance:    Alert, cooperative, no distress, appears stated age  Lungs:     Decreased breath sounds, no crackles today.   Heart:    Regular rate and rhythm, S1 and S2 normal, no murmur, rub   or gallop  Abdomen:     Soft, non-tender, bowel sounds active all four quadrants,    no masses, no organomegaly  Extremities:   Extremities normal, atraumatic, no cyanosis or edema  Pulses:   2+ and symmetric all extremities    Lab Results:  Basic Metabolic Panel:    Component Value Date/Time   NA 134* 12/24/2010 0535   K 4.1 12/24/2010 0535   CL 96 12/24/2010 0535   CO2 31 12/24/2010 0535   BUN 17 12/24/2010 0535   CREATININE 1.36* 12/24/2010 0535   GLUCOSE 104* 12/24/2010 0535   CALCIUM 9.5 12/24/2010 0535   CBC:    Component Value Date/Time   WBC 9.6 12/24/2010 0535   HGB 11.8* 12/24/2010 0535   HCT 35.6* 12/24/2010 0535   PLT 232 12/24/2010 0535   MCV 73.6* 12/24/2010 0535   NEUTROABS 7.6 03/08/2009 1550   LYMPHSABS 1.4 03/08/2009 1550   MONOABS 0.5 03/08/2009 1550   EOSABS 0.0 03/08/2009 1550   BASOSABS 0.0 03/08/2009 1550    No results found for this or any previous visit  (from the past 240 hour(s)).  Studies/Results: No results found.  Medications: Scheduled Meds:    . docusate sodium  100 mg Oral BID  . moxifloxacin  400 mg Oral Q24H  . warfarin  5 mg Oral ONCE-1800   Continuous Infusions:    . heparin 1,850 Units/hr (12/25/10 0402)   PRN Meds:.acetaminophen, albuterol, alum & mag hydroxide-simeth, chlorpheniramine-HYDROcodone, ipratropium, LORazepam, morphine, ondansetron (ZOFRAN) IV, oxyCODONE, senna, sodium chloride, zolpidem  Assessment/Plan:  Principal Problem:  *Pulmonary embolism: Unclear trigger. He will need a hypercoagulable profile after he finishes his current course of anticoagulation therapy. He remains on a heparin infusion and Coumadin per pharmacy dosing.  His INR today is therapeutic at 2.50 and pharmacy is recommending that we continue the heparin drip through tomorrow morning. Tussionex has helped his cough.  The case manager will arrange for him to be seen and followed at [facility] for PT/INR checks and coumadin dosing. Active Problems:  Fever: This is most likely due to pulmonary infarction.  He continues to have a low-grade fever. His symptoms seem to have improved with empiric Toradol therapy. A chest radiograph was done yesterday does show some  consolidation and worsening opacity on the left side, where his pain is.  He is currently on day #3 of Avelox therapy, empirically.  Hypoxia: He is currently maintaining his oxygen saturations well. Acute Renal Failure: We discontinued Toradol.  Check a BMET in the morning.  Hyponatremia: Mild. We'll monitor.  Leukocytosis: May be secondary to pulmonary infarction versus a developing pneumonia. I have started him on Avelox to address the possibility of underlying pulmonary infection.  His white blood cell count has normalized on empiric Avelox.  Chest pain syndrome: Patient is on p.r.n pain medicines for pain control.    LOS: 4 days   Tom Dalton 12/25/2010, 11:14 AM

## 2010-12-25 NOTE — Progress Notes (Signed)
Patient for d/c today, per MD request appt made at [facility] to have INR drawn on 12-26-10 at 1100.

## 2010-12-25 NOTE — Progress Notes (Signed)
ANTICOAGULATION CONSULT NOTE - Follow Up Consult  Pharmacy Consult for Warfarin and Heparin Indication: PE and DVT  No Known Allergies  Patient Measurements: Height: 5\' 4"  (162.6 cm) Weight: 171 lb 8.3 oz (77.8 kg) IBW/kg (Calculated) : 59.2   Vital Signs: Temp: 98.6 F (37 C) (11/08 0454) Temp src: Oral (11/08 0454) BP: 119/75 mmHg (11/08 0454) Pulse Rate: 80  (11/08 0454)  Labs:  Basename 12/25/10 0515 12/24/10 1142 12/24/10 0535 12/23/10 0455  HGB -- -- 11.8* 13.4  HCT -- -- 35.6* 40.2  PLT -- -- 232 212  APTT -- -- >200* 131*  LABPROT 27.4* -- 27.9* 18.9*  INR 2.50* -- 2.56* 1.55*  HEPARINUNFRC 0.30 0.34 0.28* --  CREATININE -- -- 1.36* 1.46*  CKTOTAL -- -- -- --  CKMB -- -- -- --  TROPONINI -- -- -- --   Estimated Creatinine Clearance: 70.7 ml/min (by C-G formula based on Cr of 1.36).   Medications:  Scheduled:     . docusate sodium  100 mg Oral BID  . moxifloxacin  400 mg Oral Q24H  . pneumococcal 23 valent vaccine  0.5 mL Intramuscular Tomorrow-1000  . DISCONTD: ketorolac  30 mg Intravenous Q6H   Infustions:     . heparin 1,850 Units/hr (12/25/10 0402)   PRN: acetaminophen, albuterol, alum & mag hydroxide-simeth, chlorpheniramine-HYDROcodone, ipratropium, LORazepam, morphine, ondansetron (ZOFRAN) IV, oxyCODONE, senna, sodium chloride, zolpidem  Assessment: Day #5 of 5 day minimum overlap with heparin/warfarin for new PE+DVT (started 12/21/10 at 5am). Heparin level therapeutic. (0.3) Heparin continues to infuse at 1850 units/hr.  INR remains in goal range (INR=2.50). No further bleeding reported in chart. Dose held last night to slow INR trend. Previous doses include warfarin 7.5mg  x3. Will resume dosing tonight using lower dose to maintain INR 2-3. Drug interaction with Avelox continues (possible increased INR).  Heparin drip should continue to infuse today to complete the full 5 day overlap and stop tomorrow morning (MD will need to write the D/C  order). If pt will be discharged prior to that, can switch to Lovenox for the remaining 24 hours of coverage.  Goal of Therapy:  INR 2-3 Heparin level 0.3-0.7 units/ml   Plan:  1) Continue heparin at 1850 units/hr. (18.5 ml/hr). 2) At 5am tomorrow (12/26/10), suggest discontinuing the heparin order (MD can modify the order to have a stop time of 11/9 at 04:50am.)      - Please also d/c the heparin levels when the heparin drip is discontinued. 3) Warfarin 5mg  PO x 1 at 18:00   [name] 12/25/2010,7:52 AM

## 2010-12-25 NOTE — Progress Notes (Addendum)
PT Evaluation by Clydene Laming, PT from 12/23/2010, 10:15 am.  This information is in the correct place in the PT doc flowsheets, however, was not pulled over into a note form as follows.          12/23/10 1015  PT Visit Information  Last PT Received On 12/23/10  Patient Stated Goals  Goal #1 Go home  Goal #2 Decrease pain  Home Living  Lives With Family;Significant other  Type of Home House  Home Layout One level  Home Access Stairs to enter  Entrance Stairs-Rails None  Entrance Stairs-Number of Steps 2  Prior Function  Level of Independence Independent with basic ADLs;Independent with gait  Able to Take Stairs? Yes  Driving Yes  Vocation Full time employment  Cognition  Arousal/Alertness Awake/alert  Overall Cognitive Status Appears within functional limits for tasks assessed  Bed Mobility  Bed Mobility Yes  Rolling Right 5: Supervision  Rolling Right Details (indicate cue type and reason) Increased times and multiple attempts required. L chest pain with mobiltiy. Cues for logrolling for technique  Right Sidelying to Sit 5: Supervision  Right Sidelying to Sit Details (indicate cue type and reason) Increased time and attempts, pt used feet hooked on EOB to help pull to sit.   Transfers  Transfers Yes  Sit to Stand 5: Supervision;With upper extremity assist;From bed  Sit to Stand Details (indicate cue type and reason) Safe technique noted  Stand to Sit 5: Supervision;With upper extremity assist;To chair/3-in-1  Stand to Sit Details Uses UEs to control decent  Ambulation/Gait  Ambulation/Gait Yes  Ambulation/Gait Assistance 4: Min assist  Ambulation/Gait Assistance Details (indicate cue type and reason) Min A for steadying, especially when pt has increased pain with coughing  Ambulation Distance (Feet) 200 Feet  Assistive device None  Gait Pattern Decreased step length - left;Decreased hip/knee flexion - left;Decreased weight shift to left  Stairs No  Wheelchair Mobility    Wheelchair Mobility No  Posture/Postural Control  Posture/Postural Control No significant limitations  Balance  Balance Assessed Yes  Dynamic Standing Balance  Dynamic Standing - Balance Support No upper extremity supported;During functional activity  Dynamic Standing - Level of Assistance 4: Min assist  High Level Balance  High Level Balance Activites Braiding;Side stepping;Direction changes;Turns;Sudden stops;Head turns  RLE Assessment  RLE Assessment WFL  LLE Assessment  LLE Assessment WFL  PT - End of Session  Equipment Utilized During Treatment Gait belt  Activity Tolerance Patient tolerated treatment well  Patient left in chair  General  Behavior During Session [facility] for tasks performed  Cognition [facility] for tasks performed  PT Assessment  Clinical Impression Statement Pt is 35y.o. male admitted for L chest pain and LE pain, found to have PE and Bil DVT. PTA, pt I with all ALDs, works full time, and drives. Pt currently limited by pain, requiring up to Min A for gait without device and cues for safe mobility to decrease pain. VSS throughout tx.   PT Recommendation/Assessment Patient will need skilled PT in the acute care venue  PT Problem List Decreased activity tolerance;Decreased balance;Decreased mobility;Decreased safety awareness;Cardiopulmonary status limiting activity;Pain  Barriers to Discharge None  PT Therapy Diagnosis  Acute pain  PT Plan  PT Frequency Min 3X/week  PT Treatment/Interventions Gait training;Stair training;Functional mobility training;Balance training;Neuromuscular re-education;Patient/family education  PT Recommendation  Follow Up Recommendations None  Equipment Recommended None recommended by PT  Individuals Consulted  Consulted and Agree with Results and Recommendations Patient  Acute Rehab PT Goals  PT  Goal Formulation With patient  Time For Goal Achievement 7 days  Pt will Roll Supine to Right Side Independently  PT Goal: Rolling Supine to Right  Side - Progress Progressing toward goal  Pt will go Supine/Side to Sit Independently  PT Goal: Supine/Side to Sit - Progress Progressing toward goal  Pt will Transfer Sit to Stand/Stand to Sit Independently  PT Transfer Goal: Sit to Stand/Stand to Sit - Progress Progressing toward goal  Pt will Ambulate >150 feet;Independently  PT Goal: Ambulate - Progress Progressing toward goal  Pt will Go Up / Down Stairs 1-2 stairs;Independently  PT Goal: Up/Down Stairs - Progress Progressing toward goal

## 2010-12-25 NOTE — Progress Notes (Addendum)
Physical Therapy Treatment Patient Details Name: Tom Dalton MRN: [id number] DOB: 07-06-1975 Today's Date: 12/25/2010 Time: 4098-1191   1G PT Assessment/Plan  PT - Assessment/Plan Comments on Treatment Session: Pt mobilizing fairly well. Still favoring L LE somewhat. Encouraged pt to ambulate more - with supervision of staff, if needed. PT Plan: All goals met and education completed, patient discharged from PT services PT Goals  Acute Rehab PT Goals PT Goal: Rolling Supine to Right Side - Progress: Other (comment) (Did not assess. Pt was Independent with supine to sit.) PT Goal: Supine/Side to Sit - Progress: Met PT Transfer Goal: Sit to Stand/Stand to Sit - Progress: Met (Only requires increased time - no further therapy needed.) PT Goal: Ambulate - Progress: Met (Mod I- will continue to improve with increased activity.) PT Goal: Up/Down Stairs - Progress: Met (Pt able to complete without assist. No further therapy need)  PT Treatment Precautions/Restrictions  Restrictions Weight Bearing Restrictions: No Mobility (including Balance) Bed Mobility Bed Mobility: Yes Supine to Sit: 7: Independent Sit to Supine - Right: 7: Independent Transfers Sit to Stand: 6: Modified independent (Device/Increase time);With upper extremity assist Sit to Stand Details (indicate cue type and reason): Increased time. Stand to Sit: With upper extremity assist;7: Independent Ambulation/Gait Ambulation/Gait: Yes Ambulation/Gait Assistance: 6: Modified independent (Device/Increase time) Ambulation/Gait Assistance Details (indicate cue type and reason): L LE/foot externally rotated.  Pt c/o some L heel numbness . Ambulation Distance (Feet): 400 Feet Assistive device: None Gait Pattern: Decreased stance time - left;Decreased weight shift to left Stairs: Yes Stairs Assistance: 6: Modified independent (Device/Increase time) Stair Management Technique: No rails Number of Stairs: 2     Exercise    End of  Session PT - End of Session Equipment Utilized During Treatment: Gait belt Activity Tolerance: Patient tolerated treatment well Patient left: in bed General Behavior During Session: [facility] for tasks performed Cognition: [facility] for tasks performed  Tom Dalton Alert [facility] 12/25/2010, 3:29 PM

## 2010-12-26 LAB — BASIC METABOLIC PANEL
BUN: 13 mg/dL (ref 6–23)
Chloride: 100 mEq/L (ref 96–112)
GFR calc Af Amer: 86 mL/min — ABNORMAL LOW (ref >90)
GFR calc non Af Amer: 74 mL/min — ABNORMAL LOW (ref >90)
Glucose, Bld: 103 mg/dL — ABNORMAL HIGH (ref 70–99)
Potassium: 3.5 mEq/L (ref 3.5–5.1)
Sodium: 136 mEq/L (ref 135–145)

## 2010-12-26 LAB — PROTIME-INR: Prothrombin Time: 28.8 seconds — ABNORMAL HIGH (ref 11.6–15.2)

## 2010-12-26 LAB — CBC
HCT: 34.8 % — ABNORMAL LOW (ref 39.0–52.0)
Hemoglobin: 11.7 g/dL — ABNORMAL LOW (ref 13.0–17.0)
MCH: 24.1 pg — ABNORMAL LOW (ref 26.0–34.0)
RBC: 4.86 MIL/uL (ref 4.22–5.81)

## 2010-12-26 MED ORDER — DEXTROMETHORPHAN HBR 15 MG/5ML PO SYRP: 10.0000 mL | ORAL_SOLUTION | Freq: Four times a day (QID) | ORAL | Status: AC | PRN

## 2010-12-26 MED ORDER — WARFARIN SODIUM 5 MG PO TABS
5.0000 mg | ORAL_TABLET | Freq: Once | ORAL | Status: DC
  Filled 2010-12-26: qty 1

## 2010-12-26 MED ORDER — WARFARIN SODIUM 5 MG PO TABS: 5.0000 mg | ORAL_TABLET | Freq: Every day | ORAL | Status: DC

## 2010-12-26 MED ORDER — OXYCODONE HCL 5 MG PO TABS: 5.0000 mg | ORAL_TABLET | ORAL | Status: AC | PRN

## 2010-12-26 NOTE — Progress Notes (Signed)
ANTICOAGULATION CONSULT NOTE - Follow Up Consult  Pharmacy Consult for Warfarin Indication: PE and DVT  No Known Allergies  Patient Measurements: Height: 5\' 4"  (162.6 cm) Weight: 169 lb 8 oz (76.885 kg) IBW/kg (Calculated) : 59.2   Vital Signs: Temp: 98.8 F (37.1 C) (11/09 0447) Temp src: Oral (11/09 0447) BP: 121/74 mmHg (11/09 0447) Pulse Rate: 80  (11/09 0447)  Labs:  Basename 12/26/10 0500 12/25/10 0515 12/24/10 1142 12/24/10 0535  HGB 11.7* -- -- 11.8*  HCT 34.8* -- -- 35.6*  PLT 315 -- -- 232  APTT -- -- -- >200*  LABPROT 28.8* 27.4* -- 27.9*  INR 2.66* 2.50* -- 2.56*  HEPARINUNFRC 0.29* 0.30 0.34 --  CREATININE 1.23 -- -- 1.36*  CKTOTAL -- -- -- --  CKMB -- -- -- --  TROPONINI -- -- -- --   Estimated Creatinine Clearance: 77.9 ml/min (by C-G formula based on Cr of 1.23).   Medications:  Scheduled:     . docusate sodium  100 mg Oral BID  . moxifloxacin  400 mg Oral Q24H  . warfarin  5 mg Oral ONCE-1800   Infustions:     . DISCONTD: heparin 1,850 Units/hr (12/26/10 0709)   PRN: acetaminophen, albuterol, alum & mag hydroxide-simeth, chlorpheniramine-HYDROcodone, ipratropium, LORazepam, morphine, ondansetron (ZOFRAN) IV, oxyCODONE, senna, sodium chloride, zolpidem  Assessment: Patient has now completed 5 day overlap with heparin/warfarin for new PE+DVT (started 12/21/10 at 5am, heparin stopped 11/9 at 7am). INR remains in goal range (INR=2.66). Mild hemoptysis noted in MD note. Drug interaction with Avelox continues (possible increased INR).  Goal of Therapy:  INR 2-3   Plan:  1) Repeat warfarin 5mg  PO x1 at 18:00. 2) Will d/c heparin consult and daily Heparin levels. 3) F/U daily INR trend  [name] 12/26/2010,8:35 AM

## 2010-12-26 NOTE — Progress Notes (Signed)
Patient unable to d/c yesterday, changed appt for INR to 12-29-10 at 2:15 at [facility].

## 2010-12-26 NOTE — Discharge Summary (Addendum)
Physician Discharge Summary  Patient ID: Tom Dalton MRN: [id number] DOB/AGE: 36-15-1977 36 y.o.  Admit date: 12/21/2010 Discharge date: 12/26/2010  Primary Care Physician:  Dr. Clelia Croft, [facility]  Discharge Diagnoses:    Present on Admission:  .Pulmonary embolism .Hyponatremia .Hypoxia .Chest pain syndrome  Discharge Medications:  Current Discharge Medication List    START taking these medications   Details  dextromethorphan 15 MG/5ML syrup Take 10 mLs (30 mg total) by mouth 4 (four) times daily as needed for cough. Qty: 120 mL, Refills: 0    oxyCODONE (OXY IR/ROXICODONE) 5 MG immediate release tablet Take 1 tablet (5 mg total) by mouth every 4 (four) hours as needed. Qty: 30 tablet, Refills: 0    warfarin (COUMADIN) 5 MG tablet Take 1 tablet (5 mg total) by mouth daily. Qty: 30 tablet, Refills: 2         Disposition and Follow-up: Follow-up at [facility] on 12/29/10 for labwork and further instructions regarding coumadin dosing.  Consults:  None.   Significant Diagnostic Studies:  Dg Chest 2 View  12/21/2010  *RADIOLOGY REPORT*  Clinical Data: Chest pain  CHEST - 2 VIEW  Comparison: None.  Findings: Mild bibasilar and lingular opacities.  Heart size upper normal limits to mildly enlarged. Mediastinal contours otherwise within normal limits. No pleural effusion or pneumothorax.  No acute osseous abnormality.  IMPRESSION: Bibasilar and lingular opacities; atelectasis versus infiltrate.  Original Report Authenticated By: Waneta Martins, M.D.   Ct Angio Chest W/cm &/or Wo Cm  12/21/2010  *RADIOLOGY REPORT*  Clinical Data:  Chest pain and elevated D-dimer  CT ANGIOGRAPHY CHEST WITH CONTRAST  Technique:  Multidetector CT imaging of the chest was performed using the standard protocol during bolus administration of intravenous contrast.  Multiplanar CT image reconstructions including MIPs were obtained to evaluate the vascular anatomy.  Contrast: OMNIPAQUE IOHEXOL 300  MG/ML IV SOLN  Comparison:  12/21/2010 radiograph  Findings:  Bilateral lobar are and segmental branch pulmonary emboli, with the largest clot burden is supplying the lower lobes. Heart size within normal limits.  No overt evidence for right ventricular heart strain.  No pericardial or pleural effusion.  No intrathoracic lymphadenopathy.  Normal course and caliber aorta.  Limited images through the upper abdomen show no acute abnormality.  Central airways are patent.  There are bibasilar and lingular opacities which may reflect atelectasis or infarct.  No pneumothorax.  No acute osseous abnormality.  Review of the MIP images confirms the above findings.  IMPRESSION: Bilateral pulmonary emboli within lobar and segmental branches.  No overt evidence for right ventricular heart strain.  Bibasilar and linear opacities may reflect atelectasis or infarcts.  Coronary artery calcification.  Findings discussed via telephone with Dr. Hyacinth Meeker at 05:00 a.m. on 12/21/2010  Original Report Authenticated By: Waneta Martins, M.D.   Dg Chest Portable 1 View  12/22/2010  *RADIOLOGY REPORT*  Clinical Data: Chest pain, shortness of breath, fever  PORTABLE CHEST - 1 VIEW  Comparison: Portable exam 1527 hours compared to 12/21/2010  Findings: Decreased lung volumes. Mild enlargement of cardiac silhouette. Mediastinal contours normal. Pulmonary vascular congestion. Mild persistent right basilar atelectasis. Increased opacity at left lung base likely representing increased consolidation and associated left pleural effusion. Persistent decrease in lung volumes. No pneumothorax or acute osseous findings.  IMPRESSION: Persistent decreased lung volumes right basilar atelectasis. Increased left lower lobe consolidation with small associated pleural effusion.  Original Report Authenticated By: Lollie Marrow, M.D.    Discharge Laboratory Values: Results for orders  placed during the hospital encounter of 12/21/10 (from the past 48  hour(s))  HEPARIN LEVEL     Status: Normal   Collection Time   12/24/10 11:42 AM      Component Value Range Comment   Heparin Unfractionated 0.34  0.30 - 0.70 (IU/mL)   PROTIME-INR     Status: Abnormal   Collection Time   12/25/10  5:15 AM      Component Value Range Comment   Prothrombin Time 27.4 (*) 11.6 - 15.2 (seconds)    INR 2.50 (*) 0.00 - 1.49    HEPARIN LEVEL     Status: Normal   Collection Time   12/25/10  5:15 AM      Component Value Range Comment   Heparin Unfractionated 0.30  0.30 - 0.70 (IU/mL)   PROTIME-INR     Status: Abnormal   Collection Time   12/26/10  5:00 AM      Component Value Range Comment   Prothrombin Time 28.8 (*) 11.6 - 15.2 (seconds) RESULT CHECKED   INR 2.66 (*) 0.00 - 1.49    HEPARIN LEVEL     Status: Abnormal   Collection Time   12/26/10  5:00 AM      Component Value Range Comment   Heparin Unfractionated 0.29 (*) 0.30 - 0.70 (IU/mL)   BASIC METABOLIC PANEL     Status: Abnormal   Collection Time   12/26/10  5:00 AM      Component Value Range Comment   Sodium 136  135 - 145 (mEq/L)    Potassium 3.5  3.5 - 5.1 (mEq/L)    Chloride 100  96 - 112 (mEq/L)    CO2 25  19 - 32 (mEq/L)    Glucose, Bld 103 (*) 70 - 99 (mg/dL)    BUN 13  6 - 23 (mg/dL)    Creatinine, Ser 1.61  0.50 - 1.35 (mg/dL)    Calcium 9.1  8.4 - 10.5 (mg/dL)    GFR calc non Af Amer 74 (*) >90 (mL/min)    GFR calc Af Amer 86 (*) >90 (mL/min)   CBC     Status: Abnormal   Collection Time   12/26/10  5:00 AM      Component Value Range Comment   WBC 8.9  4.0 - 10.5 (K/uL)    RBC 4.86  4.22 - 5.81 (MIL/uL)    Hemoglobin 11.7 (*) 13.0 - 17.0 (g/dL)    HCT 09.6 (*) 04.5 - 52.0 (%)    MCV 71.6 (*) 78.0 - 100.0 (fL)    MCH 24.1 (*) 26.0 - 34.0 (pg)    MCHC 33.6  30.0 - 36.0 (g/dL)    RDW 40.9  81.1 - 91.4 (%)    Platelets 315  150 - 400 (K/uL)     Brief H and P: For complete details please refer to admission H and P, but in brief, Tom Dalton is a 35 year old male who presented with  several hour history of left lower substernal pleuritic chest pain. Pt stated 1 week prior to admission he developed left calf pain and felt it was likely muskuloskeletal in nature. Pt stated 1 day prior to admission while at work,  and after eating his lunch, at around 130pm -200pm he developed left lower substernal chest pain, pleuritic in nature and intermittent. Patient stated over the afternoon his chest pain worsened, and he went home at around 630pm. Patient stated around midnight his pain worsened and he presented to ED. He  had aCT angiogram of chest done with findings consistent with bilateral PE, and subsequently was referred to the hospitalist service for further evaluation and treatment.  Physical Exam at Discharge: BP 121/74  Pulse 80  Temp(Src) 98.8 F (37.1 C) (Oral)  Resp 16  Ht 5\' 4"  (1.626 m)  Wt 76.885 kg (169 lb 8 oz)  BMI 29.09 kg/m2  SpO2 94%  General Appearance:  Alert, cooperative, no distress, appears stated age   Lungs:  Decreased breath sounds, no crackles today.   Heart:  Regular rate and rhythm, S1 and S2 normal, no murmur, rub  or gallop   Abdomen:  Soft, non-tender, bowel sounds active all four quadrants,  no masses, no organomegaly   Extremities:  Extremities normal, atraumatic, no cyanosis or edema   Pulses:  2+ and symmetric all extremities        Hospital Course:  Principal Problem:  *Pulmonary embolism: Unclear trigger. He will need a hypercoagulable profile after he finishes his current course of anticoagulation therapy. On admission, the patient was put on a heparin drip and Coumadin per pharmacy dosing. He has remained on the heparin drip until his INR has been therapeutic for at least 48 hours. The case manager has arranged for him to be seen and followed at [facility] for PT/INR checks and coumadin dosing.  Active Problems:  Fever: This was most likely due to pulmonary infarction. He was treated with 4 days of Avelox therapy, empirically. We'll  discontinue antibiotics at this time. Hypoxia: He has been maintaining his oxygen saturations well.  Acute Renal Failure: We discontinued Toradol. His creatinine improved and pain has a discharge creatinine of 1.23. We do recommend that his creatinine be checked on Monday to ensure that his renal function has stabilized. Hyponatremia: Resolved on the date of discharge.  Leukocytosis: Was felt to be secondary to pulmonary infarction versus a developing pneumonia. He was started on Avelox to address the possibility of underlying pulmonary infection. His white blood cell count has normalized on empiric Avelox, which we are discontinuing on the date of discharge. Chest pain syndrome: Patient is on p.r.n pain medicines for pain control which we will continue post discharge.     Recommendations for hospital follow-up: 1.  Follow-up for PT/INR, BMET at [facility] on Monday.  Time spent on Discharge: 40 minutes.  Signed: Abdurahman Rugg 12/26/2010, 10:28 AM

## 2011-10-26 ENCOUNTER — Ambulatory Visit (INDEPENDENT_AMBULATORY_CARE_PROVIDER_SITE_OTHER): Payer: Medicaid Other | Admitting: Pharmacist

## 2011-10-26 DIAGNOSIS — Z7901 Long term (current) use of anticoagulants: Secondary | ICD-10-CM

## 2011-10-26 DIAGNOSIS — I2699 Other pulmonary embolism without acute cor pulmonale: Secondary | ICD-10-CM

## 2011-10-26 LAB — POCT INR: INR: 2.3

## 2011-10-26 NOTE — Progress Notes (Signed)
Anti-Coagulation Progress Note  Tom Dalton is a 36 y.o. male who is currently on an anti-coagulation regimen.    RECENT RESULTS: Recent results are below, the most recent result is correlated with a dose of 25 mg. per week: Lab Results  Component Value Date   INR 2.30 10/26/2011   INR 2.66* 12/26/2010   INR 2.50* 12/25/2010    ANTI-COAG DOSE:   Latest dosing instructions   Total Sun Mon Tue Wed Thu Fri Sat   25 2.5 mg 5 mg 2.5 mg 5 mg 2.5 mg 5 mg 2.5 mg    (5 mg0.5) (5 mg1) (5 mg0.5) (5 mg1) (5 mg0.5) (5 mg1) (5 mg0.5)         ANTICOAG SUMMARY: Anticoagulation Episode Summary              Current INR goal 2.0-3.0 Next INR check 11/09/2011   INR from last check 2.30 (10/26/2011)     Weekly max dose (mg)  Target end date Indefinite   Indications Pulmonary embolism, Encounter for long-term (current) use of anticoagulants   INR check location [facility]    Send INR reminders to    Comments             ANTICOAG TODAY: Anticoagulation Summary as of 10/26/2011              INR goal 2.0-3.0     Selected INR 2.30 (10/26/2011) Next INR check 11/09/2011   Weekly max dose (mg)  Target end date Indefinite   Indications Pulmonary embolism, Encounter for long-term (current) use of anticoagulants    Anticoagulation Episode Summary              INR check location [facility] Preferred lab    Send INR reminders to    Comments             PATIENT INSTRUCTIONS: Patient Instructions  Patient instructed to take medications as defined in the Anti-coagulation Track section of this encounter.  Patient instructed to take today's dose.  Patient verbalized understanding of these instructions.        FOLLOW-UP Return in 2 weeks (on 11/09/2011) for Follow up INR at 0845h.  Hulen Luster, III Pharm.D., CACP

## 2011-10-26 NOTE — Patient Instructions (Signed)
Patient instructed to take medications as defined in the Anti-coagulation Track section of this encounter.  Patient instructed to take today's dose.  Patient verbalized understanding of these instructions.    

## 2011-11-03 ENCOUNTER — Encounter: Payer: Medicaid Other | Admitting: Internal Medicine

## 2011-11-09 ENCOUNTER — Ambulatory Visit (INDEPENDENT_AMBULATORY_CARE_PROVIDER_SITE_OTHER): Payer: Self-pay | Admitting: Pharmacist

## 2011-11-09 DIAGNOSIS — Z7901 Long term (current) use of anticoagulants: Secondary | ICD-10-CM

## 2011-11-09 DIAGNOSIS — I2699 Other pulmonary embolism without acute cor pulmonale: Secondary | ICD-10-CM

## 2011-11-09 LAB — POCT INR: INR: 2.2

## 2011-11-09 MED ORDER — WARFARIN SODIUM 5 MG PO TABS: ORAL_TABLET | ORAL | Status: DC

## 2011-11-09 NOTE — Progress Notes (Signed)
Anti-Coagulation Progress Note  Rameses Ou is a 36 y.o. male who is currently on an anti-coagulation regimen.    RECENT RESULTS: Recent results are below, the most recent result is correlated with a dose of 25 mg. per week: Lab Results  Component Value Date   INR 2.20 11/09/2011   INR 2.30 10/26/2011   INR 2.66* 12/26/2010    ANTI-COAG DOSE:   Latest dosing instructions   Total Sun Mon Tue Wed Thu Fri Sat   25 2.5 mg 5 mg 2.5 mg 5 mg 2.5 mg 5 mg 2.5 mg    (5 mg0.5) (5 mg1) (5 mg0.5) (5 mg1) (5 mg0.5) (5 mg1) (5 mg0.5)         ANTICOAG SUMMARY: Anticoagulation Episode Summary              Current INR goal 2.0-3.0 Next INR check 11/30/2011   INR from last check 2.20 (11/09/2011)     Weekly max dose (mg)  Target end date Indefinite   Indications Pulmonary embolism, Encounter for long-term (current) use of anticoagulants   INR check location [facility] lab    Send INR reminders to    Comments             ANTICOAG TODAY: Anticoagulation Summary as of 11/09/2011              INR goal 2.0-3.0     Selected INR 2.20 (11/09/2011) Next INR check 11/30/2011   Weekly max dose (mg)  Target end date Indefinite   Indications Pulmonary embolism, Encounter for long-term (current) use of anticoagulants    Anticoagulation Episode Summary              INR check location [facility] Preferred lab    Send INR reminders to    Comments             PATIENT INSTRUCTIONS: Patient Instructions  Patient instructed to take medications as defined in the Anti-coagulation Track section of this encounter.  Patient instructed to take today's dose.  Patient verbalized understanding of these instructions.        FOLLOW-UP Return in 3 weeks (on 11/30/2011) for Follow up INR at 0915h.  Hulen Luster, III Pharm.D., CACP

## 2011-11-09 NOTE — Patient Instructions (Signed)
Patient instructed to take medications as defined in the Anti-coagulation Track section of this encounter.  Patient instructed to take today's dose.  Patient verbalized understanding of these instructions.    

## 2011-11-09 NOTE — Addendum Note (Signed)
Addended by: Hulen Luster B on: 11/09/2011 02:12 PM   Modules accepted: Orders

## 2011-11-10 NOTE — Progress Notes (Signed)
Agree with plan 

## 2011-11-30 ENCOUNTER — Ambulatory Visit (INDEPENDENT_AMBULATORY_CARE_PROVIDER_SITE_OTHER): Payer: Self-pay | Admitting: Pharmacist

## 2011-11-30 DIAGNOSIS — Z7901 Long term (current) use of anticoagulants: Secondary | ICD-10-CM

## 2011-11-30 DIAGNOSIS — I2699 Other pulmonary embolism without acute cor pulmonale: Secondary | ICD-10-CM

## 2011-11-30 NOTE — Progress Notes (Signed)
Anti-Coagulation Progress Note  Tom Dalton is a 36 y.o. male who is currently on an anti-coagulation regimen.    RECENT RESULTS: Recent results are below, the most recent result is correlated with a dose of 25 mg. per week: Lab Results  Component Value Date   INR 2.50 11/30/2011   INR 2.20 11/09/2011   INR 2.30 10/26/2011    ANTI-COAG DOSE:   Latest dosing instructions   Total Sun Mon Tue Wed Thu Fri Sat   25 2.5 mg 5 mg 2.5 mg 5 mg 2.5 mg 5 mg 2.5 mg    (5 mg0.5) (5 mg1) (5 mg0.5) (5 mg1) (5 mg0.5) (5 mg1) (5 mg0.5)         ANTICOAG SUMMARY: Anticoagulation Episode Summary              Current INR goal 2.0-3.0 Next INR check 12/28/2011   INR from last check 2.50 (11/30/2011)     Weekly max dose (mg)  Target end date Indefinite   Indications Pulmonary embolism, Encounter for long-term (current) use of anticoagulants   INR check location [facility] Pref[facility] lab    Send INR reminders to    Comments             ANTICOAG TODAY: Anticoagulation Summary as of 11/30/2011              INR goal 2.0-3.0     Selected INR 2.50 (11/30/2011) Next INR check 12/28/2011   Weekly max dose (mg)  Target end date Indefinite   Indications Pulmonary embolism, Encounter for long-term (current) use of anticoagulants    Anticoagulation Episode Summary              INR check location [facility] Pref[facility] lab    Send INR reminders to    Comments             PATIENT INSTRUCTIONS: Patient Instructions  Patient instructed to take medications as defined in the Anti-coagulation Track section of this encounter.  Patient instructed to tqake today's dose.  Patient verbalized understanding of these instructions.        FOLLOW-UP Return in 4 weeks (on 12/28/2011) for Follow up INR at 0845h.  Hulen Luster, III Pharm.D., CACP

## 2011-11-30 NOTE — Progress Notes (Signed)
Agree with Dr. Groce's Assessment/plan for this patient.  

## 2011-11-30 NOTE — Patient Instructions (Signed)
Patient instructed to take medications as defined in the Anti-coagulation Track section of this encounter.  Patient instructed to tqake today's dose.  Patient verbalized understanding of these instructions.

## 2011-12-08 ENCOUNTER — Ambulatory Visit (INDEPENDENT_AMBULATORY_CARE_PROVIDER_SITE_OTHER): Payer: Self-pay | Admitting: Internal Medicine

## 2011-12-08 ENCOUNTER — Encounter: Payer: Self-pay | Admitting: Internal Medicine

## 2011-12-08 VITALS — BP 139/89 | HR 86 | Temp 97.5°F | Ht 63.5 in | Wt 178.4 lb

## 2011-12-08 DIAGNOSIS — I2699 Other pulmonary embolism without acute cor pulmonale: Secondary | ICD-10-CM

## 2011-12-08 DIAGNOSIS — Z0000 Encounter for general adult medical examination without abnormal findings: Secondary | ICD-10-CM

## 2011-12-08 NOTE — Assessment & Plan Note (Signed)
Patient is currently taking Coumadin with therapeutic INR on 11/30/11. He does not have any bleeding tendency. He does not have any signs of new DVT or pulmonary embolism. I discussed with patient about the benefit and risk for continuing his Coumadin. He understood and would like to continue his Coumadin until 12/21/11, By that time, he will have been on Coumadin for a year. After discontinuation of his Coumadin on 12/21/11, we will wait for 2 or 3 weeks before getting hypercoagulable panel. The depending on the test results, we'll make a decision whether he should be put back on Coumadin or not.

## 2011-12-08 NOTE — Patient Instructions (Signed)
1.  You can stop your Coumadin after 12/21/11 as we discussed, and come back to check your coagulable condition 2 to 3 weeks after you stop your Coumadin. 2. Please let us know if you develop bleeding tendency during this period of time. 3. If you have worsening of your symptoms or new symptoms arise, please call the clinic ([phone]), or go to the ER immediately if symptoms are severe.

## 2011-12-08 NOTE — Assessment & Plan Note (Signed)
-  Patient refused flu vaccination and TdaP, will postpone.

## 2011-12-08 NOTE — Progress Notes (Signed)
Patient ID: Tom Dalton, male   DOB: [birth year], 36 y.o.   MRN: [id number]   Subjective:   Patient ID: Tom Dalton male   DOB: 1975/03/10 36 y.o.   MRN: [id number]  HPI: [name] is a 36 y.o. with past medical history as outlined below, who presents for a followup visit.  Patient was diagnosed with unprovoked pulmonary embolism at 12/21/10. He was hospitalized from 12/21/10-12/26/10. He has been on Coumadin after being discharged from hospital. He has been checked for INR regularly. Last INR was 2.5 at 11/30/11. Patient reports that he is doing well. He does not have any pain or swelling in his legs. He does not have shortness of breath or worsening chest pain. He has a mild left lower chest pain which has been going on since his hospitalization, but his chest pain remained the same without any change in nature. He does not have any bleeding tendency.  Denies fever, chills, fatigue, headaches,  cough, SOB,  abdominal pain,diarrhea, constipation, dysuria, urgency, frequency, hematuria, joint pain or leg swelling.  Past Medical History  Diagnosis Date  . Tobacco abuse   . ETOH abuse   . Headache     when the weather changes, advil  . Pneumonia     5 years ago  . Acute renal failure 12/24/2010   Current Outpatient Prescriptions  Medication Sig Dispense Refill  . warfarin (COUMADIN) 5 MG tablet Take as directed by anticoagulation clinic provider.  30 tablet  2   Family History  Problem Relation Age of Onset  . Stroke Father     no premature CAD  . Healthy Mother     no premature CAD   History   Social History  . Marital Status: Single    Spouse Name: N/A    Number of Children: 3  . Years of Education: N/A   Occupational History  . Fingernail engineer    Social History Main Topics  . Smoking status: Current Every Day Smoker -- 0.1 packs/day for 10 years    Types: Cigarettes  . Smokeless tobacco: None  . Alcohol Use: 9.0 oz/week    15 Cans of beer per week     Drinks 8-10 beers  on Saturday and Sundays  . Drug Use: No     Denies  . Sexually Active: None   Other Topics Concern  . None   Social History Narrative  . None   Review of Systems: ROS: General: no fevers, chills, no changes in body weight, no changes in appetite Skin: no rash HEENT: no blurry vision, hearing changes or sore throat Pulm: no dyspnea, coughing, wheezing. Has very mild chest pain on the left lower chest. CV: no chest pain, palpitations, shortness of breath Abd: no nausea/vomiting, abdominal pain, diarrhea/constipation GU: no dysuria, hematuria, polyuria Ext: no arthralgias, myalgias Neuro: no weakness, numbness, or tingling   Objective:  Physical Exam: Filed Vitals:   12/08/11 1351  BP: 139/89  Pulse: 86  Temp: 97.5 F (36.4 C)  TempSrc: Oral  Height: 5' 3.5" (1.613 m)  Weight: 178 lb 6.4 oz (80.922 kg)  SpO2: 97%   general: resting in bed, not in acute distress HEENT: PERRL, EOMI, no scleral icterus Cardiac: S1/S2, RRR, No murmurs, gallops or rubs Pulm: Good air movement bilaterally, Clear to auscultation bilaterally, No rales, wheezing, rhonchi or rubs. Abd: Soft,  nondistended, nontender, no rebound pain, no organomegaly, BS present Ext: No rashes or edema, 2+DP/PT pulse bilaterally. There is no tenderness, swelling or redness over  the calf areas. Musculoskeletal: No joint deformities, erythema, or stiffness, ROM full and no nontender Skin: no rashes. No skin bruise. Neuro: alert and oriented X3, cranial nerves II-XII grossly intact, muscle strength 5/5 in all extremeties,  sensation to light touch intact.  Psych.: patient is not psychotic, no suicidal or hemocidal ideation.   Assessment & Plan:

## 2011-12-28 ENCOUNTER — Ambulatory Visit (INDEPENDENT_AMBULATORY_CARE_PROVIDER_SITE_OTHER): Payer: Self-pay | Admitting: Pharmacist

## 2011-12-28 DIAGNOSIS — I2699 Other pulmonary embolism without acute cor pulmonale: Secondary | ICD-10-CM

## 2011-12-28 DIAGNOSIS — Z7901 Long term (current) use of anticoagulants: Secondary | ICD-10-CM

## 2011-12-28 NOTE — Progress Notes (Signed)
Anti-Coagulation Progress Note  Tom Dalton is a 35 y.o. male who is currently on an anti-coagulation regimen.    RECENT RESULTS: Recent results are below, the most recent result is correlated with a dose of 25 mg. per week:  Today is LAST DAY of warfarin therapy--having completed 1 year of therapy--with advice and consent of the patient a discussion with his PCP was held in October delineating a plan of action that would see discontinuation of warfarin on or after 4-NOV-13. Patient was scheduled to see me today--as such, he continued to take warfarin until seen by me today. I reviewed notes by his PCP which clearly indicate a plan of discontinuation of warfarin based upon 1 year of therapy from his initial index VTE episode. I indicated/reminded patient of the plan which is to stop warfarin today--and in 2 to 3 weeks, consider getting a hypercoagulable panel. Patient verbalizes understanding of this plan as well as a requirement to return to the ED should he ever have any recurrence of signs or symptoms of VTE recurrence. Lab Results  Component Value Date   INR 2.0 12/28/2011   INR 2.50 11/30/2011   INR 2.20 11/09/2011    ANTI-COAG DOSE:   Latest dosing instructions   Total [name] Tue Wed Thu Fri Sat   25 2.5 mg 5 mg 2.5 mg 5 mg 2.5 mg 5 mg 2.5 mg    (5 mg0.5) (5 mg1) (5 mg0.5) (5 mg1) (5 mg0.5) (5 mg1) (5 mg0.5)         ANTICOAG SUMMARY: Anticoagulation Episode Summary              Current INR goal 2.0-3.0 Next INR check 12/28/2011   INR from last check 2.0 (12/28/2011)     Weekly max dose (mg)  Target end date 12/28/2011   Indications Pulmonary embolism (Resolved) [415.19], Encounter for long-term (current) use of anticoagulants (Resolved) [V58.61]   INR check location [facility] Preferred lab    Discontinue date 12/28/2011 Discontinue reason Therapy Completed   Send INR reminders to    Comments             ANTICOAG TODAY: Anticoagulation Summary as of 12/28/2011               INR goal 2.0-3.0     Selected INR 2.0 (12/28/2011) Next INR check    Weekly max dose (mg)  Target end date 12/28/2011   Indications Pulmonary embolism (Resolved) [415.19], Encounter for long-term (current) use of anticoagulants (Resolved) [V58.61]    Anticoagulation Episode Summary              INR check location [facility] Preferred lab    Discontinue date 12/28/2011 Discontinue reason Therapy Completed   Send INR reminders to    Comments             PATIENT INSTRUCTIONS: There are no Patient Instructions on file for this visit.   FOLLOW-UP Return if symptoms worsen or fail to improve.  Hulen Luster, III Pharm.D., CACP

## 2012-12-14 ENCOUNTER — Encounter (HOSPITAL_COMMUNITY): Payer: Self-pay | Admitting: Emergency Medicine

## 2012-12-14 ENCOUNTER — Emergency Department (HOSPITAL_COMMUNITY): Payer: BC Managed Care – PPO

## 2012-12-14 ENCOUNTER — Emergency Department (HOSPITAL_COMMUNITY)
Admission: EM | Admit: 2012-12-14 | Discharge: 2012-12-14 | Disposition: A | Discharge status: Home / Self Care | Payer: BC Managed Care – PPO | Attending: Emergency Medicine | Admitting: Emergency Medicine

## 2012-12-14 DIAGNOSIS — F1021 Alcohol dependence, in remission: Secondary | ICD-10-CM | POA: Insufficient documentation

## 2012-12-14 DIAGNOSIS — J159 Unspecified bacterial pneumonia: Secondary | ICD-10-CM | POA: Insufficient documentation

## 2012-12-14 DIAGNOSIS — F17200 Nicotine dependence, unspecified, uncomplicated: Secondary | ICD-10-CM | POA: Insufficient documentation

## 2012-12-14 DIAGNOSIS — Z87448 Personal history of other diseases of urinary system: Secondary | ICD-10-CM | POA: Insufficient documentation

## 2012-12-14 DIAGNOSIS — J189 Pneumonia, unspecified organism: Secondary | ICD-10-CM

## 2012-12-14 MED ORDER — GI COCKTAIL ~~LOC~~
30.0000 mL | Freq: Once | ORAL | Status: AC
  Administered 2012-12-14: 30 mL via ORAL
  Filled 2012-12-14: qty 30

## 2012-12-14 MED ORDER — LEVOFLOXACIN 750 MG PO TABS: 750.0000 mg | ORAL_TABLET | Freq: Every day | ORAL | Status: DC

## 2012-12-14 MED ORDER — LEVOFLOXACIN 500 MG PO TABS
750.0000 mg | ORAL_TABLET | Freq: Once | ORAL | Status: AC
  Administered 2012-12-14: 750 mg via ORAL
  Filled 2012-12-14: qty 2

## 2012-12-14 MED ORDER — KETOROLAC TROMETHAMINE 60 MG/2ML IM SOLN
60.0000 mg | Freq: Once | INTRAMUSCULAR | Status: AC
  Administered 2012-12-14: 60 mg via INTRAMUSCULAR
  Filled 2012-12-14: qty 2

## 2012-12-14 NOTE — ED Notes (Signed)
Pt states that his daughter was dx with pneumonia this am

## 2012-12-14 NOTE — ED Provider Notes (Signed)
CSN: [id number]     Arrival date & time 12/14/12  2027 History   First MD Initiated Contact with Patient 12/14/12 2112     Chief Complaint  Patient presents with  . Fever   (Consider location/radiation/quality/duration/timing/severity/associated sxs/prior Treatment) The history is provided by the patient.  Tom Dalton is a 37 y.o. male history of headache, pneumonia here presenting with cough and fever. Fever for the last 5 days. It is intermittent and he just felt warm. He also has some blisters in his mouth and has some sore throat. He has some nonproductive cough as well. His daughter had similar symptoms and was diagnosed with pneumonia earlier today.  Denies any abdominal pain or vomiting.    Past Medical History  Diagnosis Date  . Tobacco abuse   . ETOH abuse   . Headache(784.0)     when the weather changes, advil  . Pneumonia     5 years ago  . Acute renal failure 12/24/2010   History reviewed. No pertinent past surgical history. Family History  Problem Relation Age of Onset  . Stroke Father     no premature CAD  . Healthy Mother     no premature CAD   History  Substance Use Topics  . Smoking status: Current Every Day Smoker -- 0.10 packs/day for 10 years    Types: Cigarettes  . Smokeless tobacco: Not on file  . Alcohol Use: 9.0 oz/week    15 Cans of beer per week     Comment: Drinks 8-10 beers on Saturday and Sundays    Review of Systems  Constitutional: Positive for fever.  Respiratory: Positive for cough.   All other systems reviewed and are negative.    Allergies  Review of patient's allergies indicates no known allergies.  Home Medications   Current Outpatient Rx  Name  Route  Sig  Dispense  Refill  . DM-Doxylamine-Acetaminophen (NYQUIL COLD & FLU PO)   Oral   Take 1 capsule by mouth 2 (two) times daily as needed (flu-like symptoms).         Marland Kitchen ibuprofen (ADVIL,MOTRIN) 200 MG tablet   Oral   Take 400 mg by mouth every 6 (six) hours as needed for  pain (pain).          BP 153/78  Pulse 76  Temp(Src) 99 F (37.2 C) (Oral)  Resp 20  Ht 5\' 2"  (1.575 m)  Wt 173 lb (78.472 kg)  BMI 31.63 kg/m2  SpO2 97% Physical Exam  Nursing note and vitals reviewed. Constitutional: He is oriented to person, place, and time. He appears well-developed and well-nourished.  HENT:  Head: Normocephalic.  Some cold sores on tongue and OP. OP not red and tonsils not large and has no exudates   Eyes: Conjunctivae are normal. Pupils are equal, round, and reactive to light.  Neck: Normal range of motion. Neck supple.  Cardiovascular: Normal rate, regular rhythm and normal heart sounds.   Pulmonary/Chest: Effort normal.  Diminished breath sounds bilateral bases   Abdominal: Soft. Bowel sounds are normal. He exhibits no distension. There is no tenderness. There is no rebound.  Musculoskeletal: Normal range of motion.  Neurological: He is alert and oriented to person, place, and time.  Skin: Skin is warm and dry.  Psychiatric: He has a normal mood and affect. His behavior is normal. Judgment normal.    ED Course  Procedures (including critical care time) Labs Review Labs Reviewed - No data to display Imaging Review Dg Chest 2  View  12/14/2012   CLINICAL DATA:  Fever and cough  EXAM: CHEST  2 VIEW  COMPARISON:  12/22/2010  FINDINGS: Especially in the lateral projection, there is suggestion of increased coarse reticular markings at the lung base, more likely on the right. Normal heart size. No effusion or pneumothorax. No acute osseous findings.  IMPRESSION: Possible early infiltrate/pneumonia in the right lower lobe.   Electronically Signed   By: Tiburcio Pea M.D.   On: 12/14/2012 21:50    EKG Interpretation   None       MDM  No diagnosis found. Kreston Ahrendt is a 36 y.o. male here with cough, fever. CXR showed pneumonia. He was given levaquin. Will d/c home with same. Not septic appearing so doesn't require admission.      Richardean Canal,  MD 12/14/12 2216

## 2012-12-14 NOTE — ED Notes (Signed)
Pt complains of fever, cough and blisters in his mouth since last Friday

## 2013-02-10 ENCOUNTER — Encounter (HOSPITAL_COMMUNITY): Payer: Self-pay | Admitting: Emergency Medicine

## 2013-02-10 ENCOUNTER — Emergency Department (HOSPITAL_COMMUNITY)
Admission: EM | Admit: 2013-02-10 | Discharge: 2013-02-11 | Discharge status: Against Medical Advice | Payer: BC Managed Care – PPO | Attending: Emergency Medicine | Admitting: Emergency Medicine

## 2013-02-10 DIAGNOSIS — R209 Unspecified disturbances of skin sensation: Secondary | ICD-10-CM | POA: Insufficient documentation

## 2013-02-10 DIAGNOSIS — F17200 Nicotine dependence, unspecified, uncomplicated: Secondary | ICD-10-CM | POA: Insufficient documentation

## 2013-02-10 DIAGNOSIS — M79609 Pain in unspecified limb: Secondary | ICD-10-CM | POA: Insufficient documentation

## 2013-02-10 NOTE — ED Notes (Signed)
Pt reports 3 weeks ago he was sleeping on the couch for several hours with his R leg over the arm rest, when he awoke his leg was numb and painful. Pt reports his leg has been hurting continually since then and worsened when he went to the gym and pressed with his R leg. Pt reports previous blood clots to L leg that felt like this. Pt states pain is radiating up his leg, denies further complaints or ShOB, pt leg warm no redness or swelling noted at this time.

## 2013-02-10 NOTE — ED Notes (Signed)
Called for triage x1.

## 2013-03-21 ENCOUNTER — Emergency Department (HOSPITAL_COMMUNITY)
Admission: EM | Admit: 2013-03-21 | Discharge: 2013-03-21 | Disposition: A | Discharge status: Home / Self Care | Payer: BC Managed Care – PPO

## 2013-03-21 NOTE — ED Notes (Signed)
Unable to locate pt in all waiting areas 

## 2013-03-21 NOTE — ED Notes (Signed)
Unable to locate pt in all waiting areas x 3 

## 2013-03-22 ENCOUNTER — Ambulatory Visit: Payer: BC Managed Care – PPO

## 2013-05-03 ENCOUNTER — Encounter (HOSPITAL_COMMUNITY): Payer: Self-pay | Admitting: Emergency Medicine

## 2013-05-03 ENCOUNTER — Inpatient Hospital Stay (HOSPITAL_COMMUNITY): Payer: 59

## 2013-05-03 ENCOUNTER — Inpatient Hospital Stay (HOSPITAL_COMMUNITY)
Admission: EM | Admit: 2013-05-03 | Discharge: 2013-05-11 | DRG: 254 | Disposition: A | Discharge status: Home / Self Care | Payer: 59 | Attending: Internal Medicine | Admitting: Internal Medicine

## 2013-05-03 DIAGNOSIS — I82401 Acute embolism and thrombosis of unspecified deep veins of right lower extremity: Secondary | ICD-10-CM | POA: Diagnosis present

## 2013-05-03 DIAGNOSIS — D72829 Elevated white blood cell count, unspecified: Secondary | ICD-10-CM | POA: Diagnosis present

## 2013-05-03 DIAGNOSIS — I82409 Acute embolism and thrombosis of unspecified deep veins of unspecified lower extremity: Secondary | ICD-10-CM

## 2013-05-03 DIAGNOSIS — M7989 Other specified soft tissue disorders: Secondary | ICD-10-CM

## 2013-05-03 DIAGNOSIS — I82819 Embolism and thrombosis of superficial veins of unspecified lower extremities: Secondary | ICD-10-CM | POA: Diagnosis present

## 2013-05-03 DIAGNOSIS — O2230 Deep phlebothrombosis in pregnancy, unspecified trimester: Secondary | ICD-10-CM | POA: Diagnosis present

## 2013-05-03 DIAGNOSIS — Z86718 Personal history of other venous thrombosis and embolism: Secondary | ICD-10-CM

## 2013-05-03 DIAGNOSIS — Z86711 Personal history of pulmonary embolism: Secondary | ICD-10-CM

## 2013-05-03 DIAGNOSIS — Z79899 Other long term (current) drug therapy: Secondary | ICD-10-CM

## 2013-05-03 DIAGNOSIS — Z832 Family history of diseases of the blood and blood-forming organs and certain disorders involving the immune mechanism: Secondary | ICD-10-CM

## 2013-05-03 DIAGNOSIS — Z87891 Personal history of nicotine dependence: Secondary | ICD-10-CM

## 2013-05-03 DIAGNOSIS — I824Y9 Acute embolism and thrombosis of unspecified deep veins of unspecified proximal lower extremity: Principal | ICD-10-CM | POA: Diagnosis present

## 2013-05-03 HISTORY — DX: Acute embolism and thrombosis of unspecified deep veins of right lower extremity: I82.401

## 2013-05-03 HISTORY — DX: Acute embolism and thrombosis of unspecified vein: I82.90

## 2013-05-03 LAB — ANTITHROMBIN III: ANTITHROMB III FUNC: 102 % (ref 75–120)

## 2013-05-03 LAB — CBC WITH DIFFERENTIAL/PLATELET
BASOS PCT: 0 % (ref 0–1)
Basophils Absolute: 0 10*3/uL (ref 0.0–0.1)
EOS ABS: 0.8 10*3/uL — AB (ref 0.0–0.7)
EOS PCT: 7 % — AB (ref 0–5)
HCT: 42.3 % (ref 39.0–52.0)
HEMOGLOBIN: 14.3 g/dL (ref 13.0–17.0)
LYMPHS PCT: 13 % (ref 12–46)
Lymphs Abs: 1.4 10*3/uL (ref 0.7–4.0)
MCH: 24.1 pg — ABNORMAL LOW (ref 26.0–34.0)
MCHC: 33.8 g/dL (ref 30.0–36.0)
MCV: 71.3 fL — ABNORMAL LOW (ref 78.0–100.0)
Monocytes Absolute: 0.4 10*3/uL (ref 0.1–1.0)
Monocytes Relative: 4 % (ref 3–12)
NEUTROS PCT: 76 % (ref 43–77)
Neutro Abs: 8.5 10*3/uL — ABNORMAL HIGH (ref 1.7–7.7)
Platelets: 208 10*3/uL (ref 150–400)
RBC: 5.93 MIL/uL — AB (ref 4.22–5.81)
RDW: 14.3 % (ref 11.5–15.5)
WBC: 11.1 10*3/uL — ABNORMAL HIGH (ref 4.0–10.5)

## 2013-05-03 LAB — BASIC METABOLIC PANEL
BUN: 19 mg/dL (ref 6–23)
CO2: 24 mEq/L (ref 19–32)
Calcium: 10.1 mg/dL (ref 8.4–10.5)
Chloride: 95 mEq/L — ABNORMAL LOW (ref 96–112)
Creatinine, Ser: 1.25 mg/dL (ref 0.50–1.35)
GFR calc Af Amer: 82 mL/min — ABNORMAL LOW (ref >90)
GFR, EST NON AFRICAN AMERICAN: 71 mL/min — AB (ref >90)
GLUCOSE: 136 mg/dL — AB (ref 70–99)
POTASSIUM: 4.1 meq/L (ref 3.7–5.3)
SODIUM: 137 meq/L (ref 137–147)

## 2013-05-03 LAB — HEPARIN LEVEL (UNFRACTIONATED): Heparin Unfractionated: 0.82 IU/mL — ABNORMAL HIGH (ref 0.30–0.70)

## 2013-05-03 LAB — PROTIME-INR
INR: 1.01 (ref 0.00–1.49)
INR: 0.99 (ref 0.00–1.49)
PROTHROMBIN TIME: 13.1 s (ref 11.6–15.2)
PROTHROMBIN TIME: 12.9 s (ref 11.6–15.2)

## 2013-05-03 LAB — APTT: aPTT: 29 seconds (ref 24–37)

## 2013-05-03 LAB — HOMOCYSTEINE: Homocysteine: 10.5 umol/L (ref 4.0–15.4)

## 2013-05-03 MED ORDER — ACETAMINOPHEN 325 MG PO TABS: 650.0000 mg | ORAL_TABLET | Freq: Four times a day (QID) | ORAL | Status: DC | PRN

## 2013-05-03 MED ORDER — ACETAMINOPHEN 650 MG RE SUPP: 650.0000 mg | Freq: Four times a day (QID) | RECTAL | Status: DC | PRN

## 2013-05-03 MED ORDER — HYDROCODONE-ACETAMINOPHEN 5-325 MG PO TABS
1.0000 | ORAL_TABLET | ORAL | Status: DC | PRN
  Administered 2013-05-03 – 2013-05-05 (×5): 1 via ORAL
  Administered 2013-05-05 – 2013-05-11 (×8): 2 via ORAL
  Filled 2013-05-03: qty 1
  Filled 2013-05-03 (×3): qty 2
  Filled 2013-05-03 (×2): qty 1
  Filled 2013-05-03 (×4): qty 2
  Filled 2013-05-03 (×2): qty 1
  Filled 2013-05-03: qty 2

## 2013-05-03 MED ORDER — MORPHINE SULFATE 2 MG/ML IJ SOLN
1.0000 mg | INTRAMUSCULAR | Status: DC | PRN
  Administered 2013-05-05: 1 mg via INTRAVENOUS
  Filled 2013-05-03: qty 1

## 2013-05-03 MED ORDER — SODIUM CHLORIDE 0.9 % IJ SOLN
3.0000 mL | Freq: Two times a day (BID) | INTRAMUSCULAR | Status: DC
  Administered 2013-05-03 – 2013-05-05 (×2): 3 mL via INTRAVENOUS

## 2013-05-03 MED ORDER — IOHEXOL 300 MG/ML  SOLN
100.0000 mL | Freq: Once | INTRAMUSCULAR | Status: AC | PRN
  Administered 2013-05-03: 100 mL via INTRAVENOUS

## 2013-05-03 MED ORDER — ONDANSETRON HCL 4 MG/2ML IJ SOLN
4.0000 mg | Freq: Three times a day (TID) | INTRAMUSCULAR | Status: AC | PRN
Start: 2013-05-03 — End: 2013-05-03
  Administered 2013-05-03: 4 mg via INTRAVENOUS
  Filled 2013-05-03: qty 2

## 2013-05-03 MED ORDER — HEPARIN (PORCINE) IN NACL 100-0.45 UNIT/ML-% IJ SOLN
1250.0000 [IU]/h | INTRAMUSCULAR | Status: DC
  Administered 2013-05-03: 1400 [IU]/h via INTRAVENOUS
  Administered 2013-05-03: 1250 [IU]/h via INTRAVENOUS
  Filled 2013-05-03 (×2): qty 250

## 2013-05-03 MED ORDER — HYDROMORPHONE HCL PF 1 MG/ML IJ SOLN
1.0000 mg | INTRAMUSCULAR | Status: AC | PRN
  Administered 2013-05-03: 1 mg via INTRAVENOUS
  Filled 2013-05-03: qty 1

## 2013-05-03 MED ORDER — FENTANYL CITRATE 0.05 MG/ML IJ SOLN
100.0000 ug | Freq: Once | INTRAMUSCULAR | Status: AC
  Administered 2013-05-03: 100 ug via INTRAVENOUS
  Filled 2013-05-03: qty 2

## 2013-05-03 MED ORDER — HEPARIN BOLUS VIA INFUSION
5400.0000 [IU] | INTRAVENOUS | Status: AC
  Administered 2013-05-03: 5400 [IU] via INTRAVENOUS
  Filled 2013-05-03: qty 5400

## 2013-05-03 MED ORDER — ONDANSETRON HCL 4 MG PO TABS
4.0000 mg | ORAL_TABLET | Freq: Four times a day (QID) | ORAL | Status: DC | PRN
  Filled 2013-05-03: qty 1

## 2013-05-03 MED ORDER — ONDANSETRON HCL 4 MG/2ML IJ SOLN: 4.0000 mg | Freq: Four times a day (QID) | INTRAMUSCULAR | Status: DC | PRN

## 2013-05-03 MED ORDER — SODIUM CHLORIDE 0.9 % IV SOLN
INTRAVENOUS | Status: AC
  Administered 2013-05-03: 18:00:00 via INTRAVENOUS

## 2013-05-03 NOTE — ED Notes (Signed)
Patient  Ultrasound at BS 

## 2013-05-03 NOTE — Progress Notes (Addendum)
VASCULAR LAB PRELIMINARY  PRELIMINARY  PRELIMINARY  PRELIMINARY  Right lower extremity venous duplex completed.    Preliminary report:  Positive for extensive acute DVT of the right lower extremity involving the posterior tibial, peroneal, popliteal, profunda, femoral, and common femoral veins. There is only minute flow noted in the popliteal and posterior tibial veins. The anterior tibial vein appears patent at this time Also noted is a superficial thrombus involving the greater saphenous vein mid to proximal and into the saphenofemoral junction. There is no propagation to the left side. No evidence of a Baker's cyst of the right lower extremity.  Kerri Kovacik, RVS 05/03/2013, 10:50 AM

## 2013-05-03 NOTE — ED Provider Notes (Signed)
CSN: [id number]     Arrival date & time 05/03/13  4098 History   First MD Initiated Contact with Patient 05/03/13 0845     Chief Complaint  Patient presents with  . Leg Swelling     (Consider location/radiation/quality/duration/timing/severity/associated sxs/prior Treatment) HPI  Tom Dalton Is a 38 year old male who presents the emergency department with chief complaint of right leg swelling.  Patient has a significant past medical history for previous DVT, admission and 2012 for pulmonary embolus.  The patient has been on long-term for foreign is followed by the internal medicine teaching service until about one year ago.  His forefoot was discontinued at that time.  The patient complains that he has had intermittent calf pain for the past 2 months.  Patient states he works out frequently and thought he may have pulled a muscle.  Yesterday he was lifting weights and felt something "move up inside of the leg. "  Patient states he thought he may have pulled a muscle.  Around 5 PM last night he began noticing that his leg was becoming warm and swollen.  The patient has significant allergies and although he had pain in the leg was able to sleep through the night.  This morning when he woke he had severe pain which he rated an 8/10 predominantly in the right upper thigh with significant redness, heat, swelling all the way up to his groin.  The patient denies any chest pain, shortness of breath,, as this.  He has no recent confinement or surgeries.  The patient does have a family history of DVT.  She denies fevers, chills, myalgias or other signs of systemic infection.  I reviewed the patient's records and was unable to find a hypercoagulability study done.  Past Medical History  Diagnosis Date  . Tobacco abuse   . ETOH abuse   . Headache(784.0)     when the weather changes, advil  . Pneumonia     5 years ago  . Acute renal failure 12/24/2010  . Blood clot in vein    History reviewed. No pertinent  past surgical history. Family History  Problem Relation Age of Onset  . Stroke Father     no premature CAD  . Healthy Mother     no premature CAD   History  Substance Use Topics  . Smoking status: Former Smoker -- 0.10 packs/day for 10 years  . Smokeless tobacco: Never Used  . Alcohol Use: 9.0 oz/week    15 Cans of beer per week     Comment: Drinks 8-10 beers on Saturday and Sundays    Review of Systems  Ten systems reviewed and are negative for acute change, except as noted in the HPI.    Allergies  Review of patient's allergies indicates no known allergies.  Home Medications   Current Outpatient Rx  Name  Route  Sig  Dispense  Refill  . acetaminophen (TYLENOL) 325 MG tablet   Oral   Take 650 mg by mouth every 6 (six) hours as needed for mild pain (leg pain).         . DiphenhydrAMINE HCl (WAL-DRYL ALLERGY PO)   Oral   Take 1 tablet by mouth daily.          BP 128/66  Pulse 67  Temp(Src) 98.3 F (36.8 C) (Oral)  Resp 20  SpO2 100% Physical Exam  Nursing note and vitals reviewed. Constitutional: He appears well-developed and well-nourished. No distress.  HENT:  Head: Normocephalic and atraumatic.  Eyes: Conjunctivae are normal. No scleral icterus.  Neck: Normal range of motion. Neck supple.  Cardiovascular: Normal rate, regular rhythm and normal heart sounds.   Right leg with warmth, erythema, tenderness swelling from the ankle all the way up to the inguinal crease.  Distal pulses are intact.  Unable to palpate popliteal pulses due to significant swelling, right femoral pulse intact.  The patient's distal extremity and foot are becoming dusky with multiple petechiae present.  Patient denies any numbness or tingling.  Pulmonary/Chest: Effort normal and breath sounds normal. No respiratory distress.  Abdominal: Soft. There is no tenderness.  Musculoskeletal: He exhibits no edema.  Neurological: He is alert.  Skin: Skin is warm and dry. He is not diaphoretic.   Psychiatric: His behavior is normal.    ED Course  Procedures (including critical care time) Labs Review Labs Reviewed  CBC WITH DIFFERENTIAL - Abnormal; Notable for the following:    WBC 11.1 (*)    RBC 5.93 (*)    MCV 71.3 (*)    MCH 24.1 (*)    Eosinophils Relative 7 (*)    Neutro Abs 8.5 (*)    Eosinophils Absolute 0.8 (*)    All other components within normal limits  BASIC METABOLIC PANEL - Abnormal; Notable for the following:    Chloride 95 (*)    Glucose, Bld 136 (*)    GFR calc non Af Amer 71 (*)    GFR calc Af Amer 82 (*)    All other components within normal limits  PROTIME-INR  ANTITHROMBIN III  PROTEIN C ACTIVITY  PROTEIN C, TOTAL  PROTEIN S ACTIVITY  PROTEIN S, TOTAL  LUPUS ANTICOAGULANT PANEL  BETA-2-GLYCOPROTEIN I ABS, IGG/M/A  HOMOCYSTEINE  FACTOR 5 LEIDEN  PROTHROMBIN GENE MUTATION  CARDIOLIPIN ANTIBODIES, IGG, IGM, IGA  APTT  .n Imaging Review No results found.   EKG Interpretation None     CRITICAL CARE Performed by: Arthor CaptainHarris, Lyna Laningham   Total critical care time: 30  Critical care time was exclusive of separately billable procedures and treating other patients.  Critical care was necessary to treat or prevent imminent or life-threatening deterioration.  Critical care was time spent personally by me on the following activities: development of treatment plan with patient and/or surrogate as well as nursing, discussions with consultants, evaluation of patient's response to treatment, examination of patient, obtaining history from patient or surrogate, ordering and performing treatments and interventions, ordering and review of laboratory studies, ordering and review of radiographic studies, pulse oximetry and re-evaluation of patient's condition.   MDM   Final diagnoses:  DVT (deep venous thrombosis)   9:53 AM  Patient seen and shared visit with Dr. Gwendolyn GrantWalden.  Patient with impressive swelling of the right leg, concern for very large clot  with impending phlegmasia cerulea dolens. I do not suspect cellulitis.  I've ordered CBC, basic metabolic panel, hypercoagulability panel, PT INR, and vascular study of the leg.  Patient currently rates his pain at 8/10.  IV fentanyl ordered.  12:03 PM BP 128/66  Pulse 67  Temp(Src) 98.3 F (36.8 C) (Oral)  Resp 20  SpO2 100%  I spoke with Dr. Hart RochesterLawson of Vascular surgery. We discussed the case including patient hisotry and PE. As the patient does have strong, palpable pulses, he does not feel vascular surgery intervention is warranted at this time, however, he recommends IR consult for direct thrombolysis.  I then spoke with PA Jeananne RamaKevin Allred who asks that we admit the patient to the medicine service and  transfer the patient to [facility] for IR assessment and possible intervention. Dr. Ellin Goodie of [facility] has agreed to admit the patient and handle his transfer.  The patient appears reasonably stabilized for admission considering the current resources, flow, and capabilities available in the ED at this time, and I doubt any other [facility] requiring further screening and/or treatment in the ED prior to admission.    Arthor Captain, PA-C 05/03/13 1507

## 2013-05-03 NOTE — H&P (Signed)
[facility] History and Physical  Tom Dalton ZOX:[id number] DOB: 1975-07-20 DOA: 05/03/2013  Referring physician: ER physician PCP: Tom HarpNIU, XILIN, MD   Chief Complaint: right lower extremity swelling and pain  HPI:  38 year old male with past medical history significant for left lower extremity DVT dates few years back, was on anticoagulation by stopped per his primary care provider. Patient does not have a PCP at this time. Patient reported that for past one month he has experienced a pain and swelling in the right lower extremity which with comment bowel but at this time swelling and pain persisted. Patient was working out at the chin and he hit his leg and suddenly felt a shooting pain from the foot to the groin area and felt as if something is moving inside the leg. Swelling was started and it persisted. He was accompanied with redness and pain.  In ED, vital signs are stable. Based on a lower extremity Doppler study patient was found to have extensive acute DVT of the right lower extremity involving the posterior tibial, peroneal, popliteal, profunda, femoral, and common femoral veins. Also noted is a superficial thrombus involving the greater saphenous vein mid to proximal and into the saphenofemoral junction. There is no propagation to the left side. Vascular surgery consulted by ED physician and recommendation was for interventional radiology consult for direct thrombolysis. Request was made to [facility] the [facility]. [facility] to admit.  Assessment and plan:  Principal Problem:   Right leg DVT - On heparin drip. May require surgical intervention so we will leave heparin drip. - Order placed for interventional radiology for possible direct at thrombolysis - Order placed [facility] Active Problems:   Leukocytosis - Likely reactive secondary to extensive DVT - No obvious source of infection-  - Followup urinalysis   Radiological Exams on  Admission: No results found.   Code Status: Full Family Communication: Pt at bedside Disposition Plan: Admit for further evaluation  Manson PasseyEVINE, Kirk Sampley, MD  [facility] Pager [phone]  Review of Systems:  Constitutional: Negative for fever, chills and malaise/fatigue. Negative for diaphoresis.  HENT: Negative for hearing loss, ear pain, nosebleeds, congestion, sore throat, neck pain, tinnitus and ear discharge.   Eyes: Negative for blurred vision, double vision, photophobia, pain, discharge and redness.  Respiratory: Negative for cough, hemoptysis, sputum production, shortness of breath, wheezing and stridor.   Cardiovascular: Negative for chest pain, palpitations, orthopnea, claudication and leg swelling.  Gastrointestinal: Negative for nausea, vomiting and abdominal pain. Negative for heartburn, constipation, blood in stool and melena.  Genitourinary: Negative for dysuria, urgency, frequency, hematuria and flank pain.  Musculoskeletal: Negative for myalgias, back pain, joint pain and falls.  Skin: Per history of present illness  Neurological: Negative for dizziness and weakness. Negative for tingling, tremors, sensory change, speech change, focal weakness, loss of consciousness and headaches.  Endo/Heme/Allergies: Negative for environmental allergies and polydipsia. Does not bruise/bleed easily.  Psychiatric/Behavioral: Negative for suicidal ideas. The patient is not nervous/anxious.      Past Medical History  Diagnosis Date  . Tobacco abuse   . ETOH abuse   . Headache(784.0)     when the weather changes, advil  . Pneumonia     5 years ago  . Acute renal failure 12/24/2010  . Blood clot in vein    History reviewed. No pertinent past surgical history. Social History:  reports that he has quit smoking. He has never used smokeless tobacco. He reports that he drinks about 9.0  ounces of alcohol per week. He reports that he does not use illicit drugs.  No Known Allergies  Family  History  Problem Relation Age of Onset  . Stroke Father     no premature CAD  . Healthy Mother     no premature CAD     Prior to Admission medications   Medication Sig Start Date End Date Taking? Authorizing Provider  acetaminophen (TYLENOL) 325 MG tablet Take 650 mg by mouth every 6 (six) hours as needed for mild pain (leg pain).   Yes Historical Provider, MD  DiphenhydrAMINE HCl (WAL-DRYL ALLERGY PO) Take 1 tablet by mouth daily.   Yes Historical Provider, MD   Physical Exam: Filed Vitals:   05/03/13 0845  BP: 128/66  Pulse: 67  Temp: 98.3 F (36.8 C)  TempSrc: Oral  Resp: 20  SpO2: 100%    Physical Exam  Constitutional: Appears well-developed and well-nourished. No distress.  HENT: Normocephalic. External right and left ear normal. Oropharynx is clear and moist.  Eyes: Conjunctivae and EOM are normal. PERRLA, no scleral icterus.  Neck: Normal ROM. Neck supple. No JVD. No tracheal deviation. No thyromegaly.  CVS: RRR, S1/S2 +, no murmurs, no gallops, no carotid bruit.  Pulmonary: Effort and breath sounds normal, no stridor, rhonchi, wheezes, rales.  Abdominal: Soft. BS +,  no distension, tenderness, rebound or guarding.  Musculoskeletal: Normal range of motion. No edema and no tenderness.  Lymphadenopathy: No lymphadenopathy noted, cervical, inguinal. Neuro: Alert. Normal reflexes, muscle tone coordination. No cranial nerve deficit. Skin:  extensive redness and swelling of right lower extremity Psychiatric: Normal mood and affect. Behavior, judgment, thought content normal.   Labs on Admission:  Basic Metabolic Panel:  Recent Labs Lab 05/03/13 0950  NA 137  K 4.1  CL 95*  CO2 24  GLUCOSE 136*  BUN 19  CREATININE 1.25  CALCIUM 10.1   Liver Function Tests: No results found for this basename: AST, ALT, ALKPHOS, BILITOT, PROT, ALBUMIN,  in the last 168 hours No results found for this basename: LIPASE, AMYLASE,  in the last 168 hours No results found for this  basename: AMMONIA,  in the last 168 hours CBC:  Recent Labs Lab 05/03/13 0950  WBC 11.1*  NEUTROABS 8.5*  HGB 14.3  HCT 42.3  MCV 71.3*  PLT 208   Cardiac Enzymes: No results found for this basename: CKTOTAL, CKMB, CKMBINDEX, TROPONINI,  in the last 168 hours BNP: No components found with this basename: POCBNP,  CBG: No results found for this basename: GLUCAP,  in the last 168 hours  If 7PM-7AM, please contact night-coverage [email or web address] Password [facility] 05/03/2013, 12:08 PM

## 2013-05-03 NOTE — Plan of Care (Signed)
Problem: Consults Goal: Diagnosis - Venous Thromboembolism (VTE) Choose a selection Outcome: Completed/Met Date Met:  05/03/13 DVT (Deep Vein Thrombosis)

## 2013-05-03 NOTE — Progress Notes (Signed)
05/03/2013 5:55 PM  Pt transfer from [facility] via [facility].  Pt fully alert and oriented, BR with BRP one person assist.  Pt c/o 4/10 RLE sore and aching pain that is bearable at the current moment.  Pt placed on telemetry (box #[id number] notified), vitals stable.  Full assessment to EPIC.  Skin intact.  Dr. Elisabeth Pigeonevine notified of patient arrival to the floor via flow manager. Pt currently NPO, verbalizes understanding.  Pt oriented to room/unit and given admission packet.  Pt is a moderate fall risk with a score of 8.  Pt educated on fall risk score and corresponding interventions, to which he verbalized understanding, except requests his door to be shut at this time.  Yellow arm band applied to patient.  Pt encouraged to call before getting OOB or if he needed anything at all, to which he verbalized understanding.  Will continue to monitor patient. Theadora RamaKIRKMAN, Bhakti Labella Brooke

## 2013-05-03 NOTE — Consult Note (Signed)
Reason for Consult:right leg DVT Referring Physician: Dr. Devine,TRH  Tom Dalton is an 38 y.o. male.  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 Pt states for past 2 months he has noticed increasing swelling, pain , erythema of RLE. He noticed acute exacerbation of symptoms after recent weightlifting session at gym and felt as if he had "pulled a muscle" in back of right leg. 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 LE venous doppler study today was positive for extensive acute DVT of the right lower extremity involving the posterior tibial, peroneal, popliteal, profunda, femoral, and common femoral veins. There was only minute flow noted in the popliteal and posterior tibial veins. The anterior tibial vein appears patent at this time Also noted was a superficial thrombus involving the greater saphenous vein mid to proximal and into the saphenofemoral junction. There is no propagation to the left side. No evidence of a Baker's cyst of the right lower extremity. He denies any history of malignancy, recent surgical procedures, or bleeding diathesis. There is a positive family history of DVT.  He is currently on IV heparin in ED.  Past Medical History  Diagnosis Date  . Tobacco abuse   . ETOH abuse   . Headache(784.0)     when the weather changes, advil  . Pneumonia     5 years ago  . Acute renal failure 12/24/2010  . Blood clot in vein   . Right leg DVT 05/03/2013    History reviewed. No pertinent past surgical history.  Family History  Problem Relation Age of Onset  . Stroke Father     no premature CAD  . Healthy Mother     no premature CAD    Social History:  reports that he has quit smoking. He has never used smokeless tobacco. He reports that he drinks about 9.0 ounces of alcohol per week. He reports that he does not use illicit  drugs.  Allergies: No Known Allergies  Current facility-administered medications:heparin ADULT infusion 100 units/mL (25000 units/250 mL), 1,400 Units/hr, Intravenous, Continuous, Christine E Shade, RPH, Last Rate: 14 mL/hr at 05/03/13 1140, 1,400 Units/hr at 05/03/13 1140;  HYDROmorphone (DILAUDID) injection 1 mg, 1 mg, Intravenous, Q4H PRN, Abigail Harris, PA-C, 1 mg at 05/03/13 1358 ondansetron (ZOFRAN) injection 4 mg, 4 mg, Intravenous, Q8H PRN, Abigail Harris, PA-C, 4 mg at 05/03/13 1358 Current outpatient prescriptions:acetaminophen (TYLENOL) 325 MG tablet, Take 650 mg by mouth every 6 (six) hours as needed for mild pain (leg pain)., Disp: , Rfl: ;  DiphenhydrAMINE HCl (WAL-DRYL ALLERGY PO), Take 1 tablet by mouth daily., Disp: , Rfl:    Results for orders placed during the hospital encounter of 05/03/13 (from the past 48 hour(s))  CBC WITH DIFFERENTIAL     Status: Abnormal   Collection Time    05/03/13  9:50 AM      Result Value Ref Range   WBC 11.1 (*) 4.0 - 10.5 K/uL   RBC 5.93 (*) 4.22 - 5.81 MIL/uL   Hemoglobin 14.3  13.0 - 17.0 g/dL   HCT 42.3  39.0 - 52.0 %   MCV 71.3 (*) 78.0 - 100.0 fL   MCH 24.1 (*) 26.0 - 34.0 pg   MCHC 33.8  30.0 - 36.0 g/dL   RDW 14.3  11.5 - 15.5 %   Platelets 208  150 -   400 K/uL   Neutrophils Relative % 76  43 - 77 %   Lymphocytes Relative 13  12 - 46 %   Monocytes Relative 4  3 - 12 %   Eosinophils Relative 7 (*) 0 - 5 %   Basophils Relative 0  0 - 1 %   Neutro Abs 8.5 (*) 1.7 - 7.7 K/uL   Lymphs Abs 1.4  0.7 - 4.0 K/uL   Monocytes Absolute 0.4  0.1 - 1.0 K/uL   Eosinophils Absolute 0.8 (*) 0.0 - 0.7 K/uL   Basophils Absolute 0.0  0.0 - 0.1 K/uL   Smear Review LARGE PLATELETS PRESENT    BASIC METABOLIC PANEL     Status: Abnormal   Collection Time    05/03/13  9:50 AM      Result Value Ref Range   Sodium 137  137 - 147 mEq/L   Potassium 4.1  3.7 - 5.3 mEq/L   Chloride 95 (*) 96 - 112 mEq/L   CO2 24  19 - 32 mEq/L   Glucose, Bld 136 (*)  70 - 99 mg/dL   BUN 19  6 - 23 mg/dL   Creatinine, Ser 1.25  0.50 - 1.35 mg/dL   Calcium 10.1  8.4 - 10.5 mg/dL   GFR calc non Af Amer 71 (*) >90 mL/min   GFR calc Af Amer 82 (*) >90 mL/min   Comment: (NOTE)     The eGFR has been calculated using the CKD EPI equation.     This calculation has not been validated in all clinical situations.     eGFR's persistently <90 mL/min signify possible Chronic Kidney     Disease.  PROTIME-INR     Status: None   Collection Time    05/03/13  9:50 AM      Result Value Ref Range   Prothrombin Time 13.1  11.6 - 15.2 seconds   INR 1.01  0.00 - 1.49  ANTITHROMBIN III     Status: None   Collection Time    05/03/13  9:50 AM      Result Value Ref Range   AntiThromb III Func 102  75 - 120 %   Comment: Performed at [facility]  APTT     Status: None   Collection Time    05/03/13  9:50 AM      Result Value Ref Range   aPTT 29  24 - 37 seconds    No results found.  Review of Systems  Constitutional: Negative for fever and chills.  Respiratory: Negative for cough, hemoptysis and shortness of breath.   Cardiovascular: Positive for leg swelling. Negative for chest pain.  Gastrointestinal: Negative for nausea, vomiting, abdominal pain, blood in stool and melena.  Genitourinary: Negative for dysuria and hematuria.  Musculoskeletal: Negative for back pain.       Right leg edema, "tightness", erythema and pain  Neurological: Negative for headaches.  Endo/Heme/Allergies: Does not bruise/bleed easily.   Blood pressure 128/69, pulse 68, temperature 98.7 F (37.1 C), temperature source Oral, resp. rate 20, height 5' 4" (1.626 m), weight 170 lb (77.111 kg), SpO2 98.00%. Physical Exam  Constitutional: He is oriented to person, place, and time. He appears well-developed and well-nourished.  Cardiovascular: Normal rate and regular rhythm.   Intact left distal pulses, unable to adequately palpate right DP/PT  pulses secondary to edema but dopplerable   Respiratory: Effort normal and breath sounds normal.  GI: Soft. Bowel sounds are normal. He exhibits no distension. There  is no tenderness.  Musculoskeletal: Normal range of motion. He exhibits edema.  2-3+ right LE edema, with distal erythema, tender to palpation; sens fxn intact  Neurological: He is alert and oriented to person, place, and time.    Assessment/Plan: Pt with hx LLE DVT/PE in 2012, tx'd with year long coumadin; now with acute, extensive RLE DVT; hypercoagulable panel pending; currently on IV heparin; recommend transfer of pt to [facility] for further eval /poss venous thrombolytic therapy; would also obtain CT abdomen/pelvis to assess for extent of thrombus and outline anatomy prior to possible IVC filter placement; details/risks of catheter directed venous thrombolytic therapy/angioplasty to RLE d/w pt( including but not limited to, internal bleeding, infection, renal dysfunction, inability to eradicate thrombus) with his understanding. IR staff will complete evaluation and make further recommendations once at [facility].  Dericka Ostenson,D KEVIN 05/03/2013, 1:49 PM

## 2013-05-03 NOTE — Progress Notes (Deleted)
IR aware of request for possible venous thrombolysis. [facility] and [facility]. IR team will evaluate pt upon arrival.  Brayton ElKevin Lenix Benoist PA-C Interventional Radiology 05/03/2013 1:38 PM

## 2013-05-03 NOTE — ED Notes (Signed)
Patient transported to CT 

## 2013-05-03 NOTE — ED Notes (Signed)
Patient reports that he has had right leg swelling intermittently since November, but yesterday right leg was severely swollen and painful. Today the right leg is discolored, increased swelling from the foot to the groin area. Patient has a history of blood clots and states he was taken off of his blood thinner approx. One year ago.

## 2013-05-03 NOTE — ED Notes (Signed)
Patient currently getting venous duplex done.

## 2013-05-03 NOTE — ED Provider Notes (Signed)
Medical screening examination/treatment/procedure(s) were conducted as a shared visit with non-physician practitioner(s) and myself.  I personally evaluated the patient during the encounter.   EKG Interpretation None       R leg swelling, tightness. Here with concern for DVT. Hx of spontaneous PE previously, not currently on anticoagulants. R leg with distal petechiae, tight. Compartments mildly tight. US with diffuse clot, very little flow remaining. Vascular consulted for impending phlegmasia, recommended IR consult. Medicine to admit, heparin initiated. [facility]   Dagmar HaitWilliam Martine Bleecker, MD 05/03/13 [id number]

## 2013-05-03 NOTE — Progress Notes (Signed)
ANTICOAGULATION CONSULT NOTE - Initial Consult  Pharmacy Consult for Heparin  Indication: DVT  No Known Allergies  Patient Measurements: Height: 5\' 4"  (162.6 cm) Weight: 170 lb (77.111 kg) IBW/kg (Calculated) : 59.2 Last documented weight ~ 78 kg (12/14/12) Heparin Dosing Weight: 74 kg  Vital Signs: Temp: 98.3 F (36.8 C) (03/18 0845) Temp src: Oral (03/18 0845) BP: 128/66 mmHg (03/18 0845) Pulse Rate: 67 (03/18 0845)  Labs:  Recent Labs  05/03/13 0950  HGB 14.3  HCT 42.3  PLT 208    Estimated Creatinine Clearance: 74.5 ml/min (by C-G formula based on Cr of 1.25).   Medical History: Past Medical History  Diagnosis Date  . Tobacco abuse   . ETOH abuse   . Headache(784.0)     when the weather changes, advil  . Pneumonia     5 years ago  . Acute renal failure 12/24/2010  . Blood clot in vein     Medications:  Scheduled:  . fentaNYL  100 mcg Intravenous Once   Infusions:    Assessment: [age] [facility] with right leg swelling and redness.  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  Lase warfarin dose was taken on 12/27/12 with therapeutic INR on warfarin dose of 25 mg per week.  Pharmacy is consulted to dose IV Heparin for suspected DVT.   CBC:  Hgb 14.3, Plt 208  SCr 1.25, CrCl ~ 74 ml/min  Baseline coags: INR 1, APPT 29  Hypercoagulability panel: pending  Goal of Therapy:  Heparin level 0.3-0.7 units/ml Monitor platelets by anticoagulation protocol: Yes   Plan:   Give heparin 5400 units bolus IV x 1 STAT  Start heparin IV infusion at 1400 units/hr  Heparin level 6 hours after starting  Daily heparin level and CBC  Continue to monitor H&H and platelets    Lynann Beaverhristine Erving Sassano PharmD, BCPS Pager [phone] 05/03/2013 11:08 AM

## 2013-05-03 NOTE — Progress Notes (Signed)
[facility] On heparin drip. CTA done. Thrombus evident to ext iliac but IVC patent.  Dr. Bonnielee HaffHoss has d/w attending. Will plan for IVC filter and initiation of unilateral (R)LE catheter directed thrombolysis tomorrow am. Will have heparin held at 0700, case to start at 0800. Will rediscuss and consent patient in am.  Brayton ElKevin Berneice Zettlemoyer PA-C Interventional Radiology 05/03/2013 5:03 PM

## 2013-05-04 ENCOUNTER — Inpatient Hospital Stay (HOSPITAL_COMMUNITY): Payer: 59

## 2013-05-04 LAB — COMPREHENSIVE METABOLIC PANEL
ALBUMIN: 3.5 g/dL (ref 3.5–5.2)
ALT: 19 U/L (ref 0–53)
AST: 16 U/L (ref 0–37)
Alkaline Phosphatase: 62 U/L (ref 39–117)
BUN: 14 mg/dL (ref 6–23)
CALCIUM: 8.9 mg/dL (ref 8.4–10.5)
CO2: 23 mEq/L (ref 19–32)
Chloride: 100 mEq/L (ref 96–112)
Creatinine, Ser: 1.34 mg/dL (ref 0.50–1.35)
GFR calc Af Amer: 76 mL/min — ABNORMAL LOW (ref >90)
GFR calc non Af Amer: 65 mL/min — ABNORMAL LOW (ref >90)
Glucose, Bld: 87 mg/dL (ref 70–99)
POTASSIUM: 4.1 meq/L (ref 3.7–5.3)
SODIUM: 136 meq/L — AB (ref 137–147)
TOTAL PROTEIN: 7.5 g/dL (ref 6.0–8.3)
Total Bilirubin: 2 mg/dL — ABNORMAL HIGH (ref 0.3–1.2)

## 2013-05-04 LAB — CBC
HCT: 39 % (ref 39.0–52.0)
HCT: 37.8 % — ABNORMAL LOW (ref 39.0–52.0)
HEMATOCRIT: 40.4 % (ref 39.0–52.0)
HEMOGLOBIN: 13.1 g/dL (ref 13.0–17.0)
HEMOGLOBIN: 13.6 g/dL (ref 13.0–17.0)
Hemoglobin: 12.8 g/dL — ABNORMAL LOW (ref 13.0–17.0)
MCH: 24.2 pg — ABNORMAL LOW (ref 26.0–34.0)
MCH: 24.3 pg — AB (ref 26.0–34.0)
MCH: 24.2 pg — AB (ref 26.0–34.0)
MCHC: 33.6 g/dL (ref 30.0–36.0)
MCHC: 33.7 g/dL (ref 30.0–36.0)
MCHC: 33.9 g/dL (ref 30.0–36.0)
MCV: 72.1 fL — ABNORMAL LOW (ref 78.0–100.0)
MCV: 72.3 fL — AB (ref 78.0–100.0)
MCV: 71.6 fL — AB (ref 78.0–100.0)
PLATELETS: 195 10*3/uL (ref 150–400)
PLATELETS: 171 10*3/uL (ref 150–400)
Platelets: 175 10*3/uL (ref 150–400)
RBC: 5.41 MIL/uL (ref 4.22–5.81)
RBC: 5.59 MIL/uL (ref 4.22–5.81)
RBC: 5.28 MIL/uL (ref 4.22–5.81)
RDW: 14.5 % (ref 11.5–15.5)
RDW: 14.4 % (ref 11.5–15.5)
RDW: 14.4 % (ref 11.5–15.5)
WBC: 8.3 10*3/uL (ref 4.0–10.5)
WBC: 8.3 10*3/uL (ref 4.0–10.5)
WBC: 10 10*3/uL (ref 4.0–10.5)

## 2013-05-04 LAB — URINALYSIS, ROUTINE W REFLEX MICROSCOPIC
Bilirubin Urine: NEGATIVE
Glucose, UA: NEGATIVE mg/dL
Hgb urine dipstick: NEGATIVE
Ketones, ur: NEGATIVE mg/dL
LEUKOCYTES UA: NEGATIVE
Nitrite: NEGATIVE
PH: 5.5 (ref 5.0–8.0)
PROTEIN: NEGATIVE mg/dL
Specific Gravity, Urine: 1.02 (ref 1.005–1.030)
Urobilinogen, UA: 0.2 mg/dL (ref 0.0–1.0)

## 2013-05-04 LAB — PROTEIN C ACTIVITY: Protein C Activity: 42 % — ABNORMAL LOW (ref 75–133)

## 2013-05-04 LAB — LUPUS ANTICOAGULANT PANEL
DRVVT INCUBATED 1 1 MIX: 38 s (ref <42.9)
DRVVT: 56.7 s — AB (ref <42.9)
LUPUS ANTICOAGULANT: NOT DETECTED
PTT Lupus Anticoagulant: 45.6 secs — ABNORMAL HIGH (ref 28.0–43.0)
PTTLA 41 MIX: 38.1 s (ref 28.0–43.0)

## 2013-05-04 LAB — MRSA PCR SCREENING: MRSA by PCR: NEGATIVE

## 2013-05-04 LAB — PROTHROMBIN GENE MUTATION

## 2013-05-04 LAB — HEPARIN LEVEL (UNFRACTIONATED)
HEPARIN UNFRACTIONATED: 0.55 [IU]/mL (ref 0.30–0.70)
Heparin Unfractionated: <0.1 IU/mL — ABNORMAL LOW (ref 0.30–0.70)
Heparin Unfractionated: <0.1 IU/mL — ABNORMAL LOW (ref 0.30–0.70)
Heparin Unfractionated: <0.1 IU/mL — ABNORMAL LOW (ref 0.30–0.70)

## 2013-05-04 LAB — CARDIOLIPIN ANTIBODIES, IGG, IGM, IGA
ANTICARDIOLIPIN IGA: 9 U/mL — AB (ref <22)
ANTICARDIOLIPIN IGG: 8 GPL U/mL — AB (ref <23)
ANTICARDIOLIPIN IGM: 8 [MPL'U]/mL — AB (ref <11)

## 2013-05-04 LAB — BETA-2-GLYCOPROTEIN I ABS, IGG/M/A
BETA-2-GLYCOPROTEIN I IGA: 10 A Units (ref <20)
Beta-2 Glyco I IgG: 6 G Units (ref <20)
Beta-2-Glycoprotein I IgM: 5 M Units (ref <20)

## 2013-05-04 LAB — GLUCOSE, CAPILLARY: Glucose-Capillary: 83 mg/dL (ref 70–99)

## 2013-05-04 LAB — FACTOR 5 LEIDEN

## 2013-05-04 LAB — FIBRINOGEN
FIBRINOGEN: 418 mg/dL (ref 204–475)
FIBRINOGEN: 254 mg/dL (ref 204–475)

## 2013-05-04 LAB — PROTEIN S ACTIVITY: PROTEIN S ACTIVITY: 128 % (ref 69–129)

## 2013-05-04 LAB — TSH: TSH: 0.79 u[IU]/mL (ref 0.350–4.500)

## 2013-05-04 MED ORDER — FENTANYL CITRATE 0.05 MG/ML IJ SOLN
INTRAMUSCULAR | Status: AC
  Filled 2013-05-04: qty 4

## 2013-05-04 MED ORDER — SODIUM CHLORIDE 0.9 % IV SOLN
INTRAVENOUS | Status: DC
  Administered 2013-05-04 – 2013-05-05 (×2): via INTRAVENOUS
  Administered 2013-05-07: 10 mL/h via INTRAVENOUS

## 2013-05-04 MED ORDER — SODIUM CHLORIDE 0.9 % IJ SOLN: 3.0000 mL | INTRAMUSCULAR | Status: DC | PRN

## 2013-05-04 MED ORDER — HEPARIN SODIUM (PORCINE) 1000 UNIT/ML IJ SOLN
INTRAMUSCULAR | Status: AC
  Filled 2013-05-04: qty 1

## 2013-05-04 MED ORDER — TENECTEPLASE 50 MG IV KIT
0.5000 mg/h | PACK | INTRAVENOUS | Status: DC
  Administered 2013-05-04 – 2013-05-05 (×4): 0.5 mg/h
  Filled 2013-05-04 (×7): qty 0.5

## 2013-05-04 MED ORDER — SODIUM CHLORIDE 0.9 % IJ SOLN
3.0000 mL | Freq: Two times a day (BID) | INTRAMUSCULAR | Status: DC
  Administered 2013-05-04: 3 mL via INTRAVENOUS
  Administered 2013-05-04: 14:00:00 via INTRAVENOUS

## 2013-05-04 MED ORDER — SODIUM CHLORIDE 0.9 % IV SOLN
250.0000 mL | INTRAVENOUS | Status: DC | PRN
Start: 2013-05-04 — End: 2013-05-05

## 2013-05-04 MED ORDER — HEPARIN (PORCINE) IN NACL 100-0.45 UNIT/ML-% IJ SOLN
1100.0000 [IU]/h | INTRAMUSCULAR | Status: DC
  Administered 2013-05-04: 800 [IU]/h via INTRAVENOUS
  Administered 2013-05-04: 900 [IU]/h via INTRAVENOUS
  Filled 2013-05-04 (×2): qty 250

## 2013-05-04 MED ORDER — MIDAZOLAM HCL 2 MG/2ML IJ SOLN
INTRAMUSCULAR | Status: AC
  Filled 2013-05-04: qty 4

## 2013-05-04 MED ORDER — MIDAZOLAM HCL 2 MG/2ML IJ SOLN
INTRAMUSCULAR | Status: AC | PRN
  Administered 2013-05-04 (×3): 0.5 mg via INTRAVENOUS
  Administered 2013-05-04: 1 mg via INTRAVENOUS
  Administered 2013-05-04: 0.5 mg via INTRAVENOUS

## 2013-05-04 MED ORDER — TENECTEPLASE 50 MG IV KIT
2500.0000 ug | PACK | INTRAVENOUS | Status: AC | PRN
  Administered 2013-05-04: 0.5 mg/h via INTRAVENOUS

## 2013-05-04 MED ORDER — IOHEXOL 300 MG/ML  SOLN
100.0000 mL | Freq: Once | INTRAMUSCULAR | Status: AC | PRN
Start: 2013-05-04 — End: 2013-05-04
  Administered 2013-05-04: 15 mL via INTRAVENOUS

## 2013-05-04 MED ORDER — IOHEXOL 300 MG/ML  SOLN
100.0000 mL | Freq: Once | INTRAMUSCULAR | Status: AC | PRN
  Administered 2013-05-04: 40 mL via INTRAVENOUS

## 2013-05-04 MED ORDER — TENECTEPLASE 50 MG IV KIT
0.5000 mg/h | PACK | INTRAVENOUS | Status: DC
  Filled 2013-05-04: qty 0.5

## 2013-05-04 MED ORDER — FENTANYL CITRATE 0.05 MG/ML IJ SOLN
INTRAMUSCULAR | Status: AC | PRN
  Administered 2013-05-04 (×2): 25 ug via INTRAVENOUS
  Administered 2013-05-04: 50 ug via INTRAVENOUS
  Administered 2013-05-04 (×2): 25 ug via INTRAVENOUS

## 2013-05-04 NOTE — Sedation Documentation (Signed)
TNK started per order at 0.5mg /hr

## 2013-05-04 NOTE — Sedation Documentation (Signed)
Family updated as to patient's status.

## 2013-05-04 NOTE — Sedation Documentation (Signed)
Patient denies pain and is resting comfortably.  

## 2013-05-04 NOTE — Sedation Documentation (Signed)
Pharmacy notified of need for TNK

## 2013-05-04 NOTE — Sedation Documentation (Signed)
Position pt for lysis procedure

## 2013-05-04 NOTE — Progress Notes (Signed)
Patient ID: Tom Dalton, male   DOB: 07-31-1975, 38 y.o.   MRN: [id number]   Pt has been consented for Rt lower extremity venogram with thrombolysis and possible angioplasty/stent placement.  Placement of Inferior Vena Cava filter.  Pt aware of procedure benefits and risks and agreeable to proceed Consent signed and in chart

## 2013-05-04 NOTE — Progress Notes (Signed)
ANTICOAGULATION CONSULT NOTE -follow up Pharmacy Consult for Heparin  Indication: DVT  No Known Allergies  Patient Measurements: Height: 5\' 4"  (162.6 cm) Weight: 161 lb 11.2 oz (73.347 kg) IBW/kg (Calculated) : 59.2 Last documented weight ~ 78 kg (12/14/12) Heparin Dosing Weight: 74 kg  Vital Signs: Temp: 98.3 F (36.8 C) (03/19 1530) Temp src: Oral (03/19 1530) BP: 113/82 mmHg (03/19 1800) Pulse Rate: 61 (03/19 1800)  Labs:  Recent Labs  05/03/13 0950  05/03/13 1928 05/04/13 0430 05/04/13 0449 05/04/13 1410 05/04/13 1815  HGB 14.3  --   --   --  13.1 13.6 12.8*  HCT 42.3  --   --   --  39.0 40.4 37.8*  PLT 208  --   --   --  195 175 171  APTT 29  --   --   --   --   --   --   LABPROT 13.1  --  12.9  --   --   --   --   INR 1.01  --  0.99  --   --   --   --   HEPARINUNFRC  --   < > 0.82* 0.55  --  <0.10* <0.10*  CREATININE 1.25  --   --   --  1.34  --   --   < > = values in this interval not displayed.  Estimated Creatinine Clearance: 67.8 ml/min (by C-G formula based on Cr of 1.34).  Assessment: [age] yoM [facility] with right leg swelling and redness.  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  Last warfarin dose was taken on 12/27/12 with therapeutic INR on warfarin dose of 25 mg per week.  Pharmacy consulted to dose IV Heparin.  He is now s/p IVC filter placement and RLE venous lysis in IR. He currently has TNKase running at 0.5 mg/hr. His heparin level this evening is < 0.1 on IV heparin rate of 800 units/hr.  He has had some bleeding around the venous sheath site.  I spoke with his RN and she has applied pressure and has encouraged the patient to try and minimize movement that might cause further bleeding.  It is currently controlled and no noted bleeding.  VTE treatment with a low heparin goal of 0.2 - 0.5 while pt on TNK.     Goal of Therapy:  Heparin level 0.2 - 0.5 per MD  3/19 Monitor platelets by anticoagulation protocol: Yes   Plan:  -  Will increase heparin to 900 units/hr and check 8 hr HL. -  Pt also on TNKase at 0.5 mg/hr.   -  Daily HL/cbc  Nadara MustardNita Kamerin Grumbine, PharmD., MS Clinical Pharmacist Pager:  [phone] Thank you for allowing pharmacy to be part of this patients care team. 05/04/2013 7:22 PM

## 2013-05-04 NOTE — Progress Notes (Signed)
ANTICOAGULATION CONSULT NOTE - Follow Up Consult  Pharmacy Consult for Heparin  Indication: DVT  No Known Allergies  Patient Measurements: Height: 5\' 4"  (162.6 cm) Weight: 161 lb 11.2 oz (73.347 kg) IBW/kg (Calculated) : 59.2  Vital Signs: Temp: 97.3 F (36.3 C) (03/19 0505) Temp src: Oral (03/19 0505) BP: 130/71 mmHg (03/19 0505) Pulse Rate: 56 (03/19 0505)  Labs:  Recent Labs  05/03/13 0950 05/03/13 1928 05/04/13 0430 05/04/13 0449  HGB 14.3  --   --  13.1  HCT 42.3  --   --  39.0  PLT 208  --   --  195  APTT 29  --   --   --   LABPROT 13.1 12.9  --   --   INR 1.01 0.99  --   --   HEPARINUNFRC  --  0.82* 0.55  --   CREATININE 1.25  --   --  1.34    Estimated Creatinine Clearance: 67.8 ml/min (by C-G formula based on Cr of 1.34).   Medications:  Heparin 1250 units/hr  Assessment: 38 y/o M on heparin for DVT, HL is 0.55 after rate decrease, other labs as above.   Of note, plan is for IVC and catheter thrombolysis at 0800 today with plans to stop heparin at 0700 per PA notes  Goal of Therapy:  Heparin level 0.3-0.7 units/ml Monitor platelets by anticoagulation protocol: Yes   Plan:  -Continue heparin at 1250 units/hr -F/U plans for heparin after IR case today -Monitor for bleeding  Abran DukeLedford, Keshawna Dix 05/04/2013,6:22 AM

## 2013-05-04 NOTE — Sedation Documentation (Signed)
Pt prone denies c/o continue with lysis procedure and sedation monitoring

## 2013-05-04 NOTE — Procedures (Signed)
IVC filter RLE venous lysis No comp

## 2013-05-04 NOTE — Progress Notes (Addendum)
ANTICOAGULATION CONSULT NOTE -follow up Pharmacy Consult for Heparin  Indication: DVT  No Known Allergies  Patient Measurements: Height: 5\' 4"  (162.6 cm) Weight: 161 lb 11.2 oz (73.347 kg) IBW/kg (Calculated) : 59.2 Last documented weight ~ 78 kg (12/14/12) Heparin Dosing Weight: 74 kg  Vital Signs: Temp: 97.3 F (36.3 C) (03/19 0505) Temp src: Oral (03/19 0505) BP: 134/68 mmHg (03/19 1230) Pulse Rate: 63 (03/19 1230)  Labs:  Recent Labs  05/03/13 0950 05/03/13 1928 05/04/13 0430 05/04/13 0449  HGB 14.3  --   --  13.1  HCT 42.3  --   --  39.0  PLT 208  --   --  195  APTT 29  --   --   --   LABPROT 13.1 12.9  --   --   INR 1.01 0.99  --   --   HEPARINUNFRC  --  0.82* 0.55  --   CREATININE 1.25  --   --  1.34    Estimated Creatinine Clearance: 67.8 ml/min (by C-G formula based on Cr of 1.34).    Assessment: [age] yoM [facility] with right leg swelling and redness.  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  Last warfarin dose was taken on 12/27/12 with therapeutic INR on warfarin dose of 25 mg per week.  Pharmacy consulted to dose IV Heparin.  He is now s/p IVC filter placement and RLE venous lysis in IR. He currently has TNKase running at 0.5 mg/hr.  Pharmacy consulted to dose heparin for VTE treatment with a low heparin goal of 0.2 - 0.5 while pt on TNK.  Prior to IR procedure his HL was 0.55 on 1250 units/hr.  Heparin was turned off at 0700 am for procedure.  Hg 13.1, pltc 195;  No bleeding noted.   Goal of Therapy:  Heparin level 0.2 - 0.5 per MD 3/19 Monitor platelets by anticoagulation protocol: Yes   Plan:  -resume heparin at 800 units/hr and check 6 hr HL. Pt also on TNKase at 0.5 mg/hr.   Daily HL/cbc Herby AbrahamMichelle T. Jilene Spohr, Pharm.D. [phone] 05/04/2013 12:59 PM

## 2013-05-04 NOTE — Sedation Documentation (Signed)
Patient is resting comfortably. 

## 2013-05-04 NOTE — Sedation Documentation (Signed)
Floor notified of transfer to [id number]

## 2013-05-04 NOTE — Progress Notes (Signed)
[facility] PROGRESS NOTE  Eli Pattillo ZOX:[id number] DOB: 15-Oct-1975 DOA: 05/03/2013 PCP: Lorretta Harp, MD  Assessment/Plan: Right leg DVT  - was on  heparin drip>> dc'ed this am and pt had direct thrombolysis per IR with Tenecteplase till repeat arteriogram is done. Also had IVC filter placed - appreciate IR assistance -continue pain management Active Problems:  Leukocytosis  - Likely reactive secondary to extensive DVT No obvious source of infection, UA neg - resolved   Code Status: full Family Communication: none at bedside Disposition Plan: to home when medically ready   Consultants:  IR  Vascular per EDP   Procedures: S/P Rt lower extremity venogram with thrombolysis and   Placement of Inferior Vena Cava filter- Per Dr Bonnielee Haff 3/19   Antibiotics:  None  HPI/Subjective: S/p R. IVC filter and thrombolysis per IR, c/o RLE pain. Denies SOB  Objective: Filed Vitals:   05/04/13 1530  BP:   Pulse:   Temp: 98.3 F (36.8 C)  Resp:     Intake/Output Summary (Last 24 hours) at 05/04/13 1741 Last data filed at 05/04/13 1600  Gross per 24 hour  Intake 889.85 ml  Output    350 ml  Net 539.85 ml   Filed Weights   05/03/13 1251 05/03/13 2244  Weight: 77.111 kg (170 lb) 73.347 kg (161 lb 11.2 oz)    Exam:  General: alert & oriented x 3 in NAD Cardiovascular: RRR, nl S1 s2 Respiratory: CTAB Abdomen: soft +BS NT/ND, no masses palpable Extremities: RLE markedly edematous and tight. No cyanosis.      Data Reviewed: Basic Metabolic Panel:  Recent Labs Lab 05/03/13 0950 05/04/13 0449  NA 137 136*  K 4.1 4.1  CL 95* 100  CO2 24 23  GLUCOSE 136* 87  BUN 19 14  CREATININE 1.25 1.34  CALCIUM 10.1 8.9   Liver Function Tests:  Recent Labs Lab 05/04/13 0449  AST 16  ALT 19  ALKPHOS 62  BILITOT 2.0*  PROT 7.5  ALBUMIN 3.5   No results found for this basename: LIPASE, AMYLASE,  in the last 168 hours No results found for this basename: AMMONIA,   in the last 168 hours CBC:  Recent Labs Lab 05/03/13 0950 05/04/13 0449 05/04/13 1410  WBC 11.1* 8.3 8.3  NEUTROABS 8.5*  --   --   HGB 14.3 13.1 13.6  HCT 42.3 39.0 40.4  MCV 71.3* 72.1* 72.3*  PLT 208 195 175   Cardiac Enzymes: No results found for this basename: CKTOTAL, CKMB, CKMBINDEX, TROPONINI,  in the last 168 hours BNP (last 3 results) No results found for this basename: PROBNP,  in the last 8760 hours CBG:  Recent Labs Lab 05/04/13 0801  GLUCAP 83    Recent Results (from the past 240 hour(s))  MRSA PCR SCREENING     Status: None   Collection Time    05/04/13  1:04 PM      Result Value Ref Range Status   MRSA by PCR NEGATIVE  NEGATIVE Final   Comment:            The GeneXpert MRSA Assay (FDA     approved for NASAL specimens     only), is one component of a     comprehensive MRSA colonization     surveillance program. It is not     intended to diagnose MRSA     infection nor to guide or     monitor treatment for     MRSA infections.  Studies: Ir Veno/ext/uni Right  05/04/2013   CLINICAL DATA:  Right lower extremity DVT  EXAM: IR INFUSION THROMBOL VENOUS INITIAL (MS); IR ULTRASOUND GUIDANCE VASC ACCESS RIGHT; RIGHT EXTREMITY VENOGRAPHY; INFERIOR VENA CAVOGRAM  FLUOROSCOPY TIME:  6 min and 6 seconds.  MEDICATIONS AND MEDICAL HISTORY: Versed 1.5 mg, Fentanyl 75 mcg.  Additional Medications: None.  ANESTHESIA/SEDATION: Moderate sedation time: 25 minutes  CONTRAST:  30 cc Omnipaque 300  PROCEDURE: The procedure, risks, benefits, and alternatives were explained to the patient. Questions regarding the procedure were encouraged and answered. The patient understands and consents to the procedure.  The right popliteal fossa in the prone position was prepped with Betadine in a sterile fashion, and a sterile drape was applied covering the operative field. A sterile gown and sterile gloves were used for the procedure.  Under sonographic guidance, a micropuncture needle  was inserted into the thrombosis popliteal vein and removed over a 018 wire. This was up sized to a glidewire. A 7 French sheath was inserted over the glidewire into the popliteal vein. Right lower extremity venography was performed.  A Kumpe the catheter was advanced over the glidewire to the femoral vein. The catheter was slowly advanced over the wire with intermittent injections for venography, to the level of the IVC. IVC venography was performed.  The thrombus was noted to be somewhat hard preventing easy passage of the Kumpe the catheter. Angiojet and mechanical thrombectomy was therefore not performed today.  The Kumpe be was exchanged over a stiff glidewire for a 90 cm, 50 cm infusion length 5 French multi side hole catheter.  FINDINGS: Venography of the right lower extremity demonstrates massive DVT extending from the popliteal vein into the common iliac vein.  The final image demonstrates an infusion catheter extending from the right popliteal vein to the IVC. IVC filter is in place.  COMPLICATIONS: None  IMPRESSION: Successful establishment of access for right lower extremity venous lysis. TNK at 0.5 milligrams/hour was instituted.   Electronically Signed   By: Maryclare BeanArt  Hoss M.D.   On: 05/04/2013 14:00   Ir Venocavagram Ivc  05/04/2013   CLINICAL DATA:  Right lower extremity DVT  EXAM: IR INFUSION THROMBOL VENOUS INITIAL (MS); IR ULTRASOUND GUIDANCE VASC ACCESS RIGHT; RIGHT EXTREMITY VENOGRAPHY; INFERIOR VENA CAVOGRAM  FLUOROSCOPY TIME:  6 min and 6 seconds.  MEDICATIONS AND MEDICAL HISTORY: Versed 1.5 mg, Fentanyl 75 mcg.  Additional Medications: None.  ANESTHESIA/SEDATION: Moderate sedation time: 25 minutes  CONTRAST:  30 cc Omnipaque 300  PROCEDURE: The procedure, risks, benefits, and alternatives were explained to the patient. Questions regarding the procedure were encouraged and answered. The patient understands and consents to the procedure.  The right popliteal fossa in the prone position was  prepped with Betadine in a sterile fashion, and a sterile drape was applied covering the operative field. A sterile gown and sterile gloves were used for the procedure.  Under sonographic guidance, a micropuncture needle was inserted into the thrombosis popliteal vein and removed over a 018 wire. This was up sized to a glidewire. A 7 French sheath was inserted over the glidewire into the popliteal vein. Right lower extremity venography was performed.  A Kumpe the catheter was advanced over the glidewire to the femoral vein. The catheter was slowly advanced over the wire with intermittent injections for venography, to the level of the IVC. IVC venography was performed.  The thrombus was noted to be somewhat hard preventing easy passage of the Kumpe the catheter. Angiojet and mechanical thrombectomy  was therefore not performed today.  The Kumpe be was exchanged over a stiff glidewire for a 90 cm, 50 cm infusion length 5 French multi side hole catheter.  FINDINGS: Venography of the right lower extremity demonstrates massive DVT extending from the popliteal vein into the common iliac vein.  The final image demonstrates an infusion catheter extending from the right popliteal vein to the IVC. IVC filter is in place.  COMPLICATIONS: None  IMPRESSION: Successful establishment of access for right lower extremity venous lysis. TNK at 0.5 milligrams/hour was instituted.   Electronically Signed   By: Maryclare Bean M.D.   On: 05/04/2013 14:00   Ir Ivc Filter Plmt / S&i /img Guid/mod Sed  05/04/2013   CLINICAL DATA:  Right lower extremity DVT  EXAM: IVC FILTER,INFERIOR VENA CAVOGRAM, FLUOROSCOPIC GUIDANCE FOR CENTRAL VENOUS CATHETER PLACED  FLUOROSCOPY TIME:  1 min and 54 seconds.  MEDICATIONS AND MEDICAL HISTORY: Versed 1.5 mg, Fentanyl 75 mcg.  Additional Medications: None.  ANESTHESIA/SEDATION: Moderate sedation time: 20 minutes  CONTRAST:  40 cc Omnipaque 300  PROCEDURE: The procedure, risks, benefits, and alternatives were  explained to the patient. Questions regarding the procedure were encouraged and answered. The patient understands and consents to the procedure.  The right neck was prepped with Betadine in a sterile fashion, and a sterile drape was applied covering the operative field. A sterile gown and sterile gloves were used for the procedure.  The right internal jugular vein was noted to be patent initially with ultrasound. Under sonographic guidance, a micropuncture needle was inserted into the right internal jugular vein (Ultrasound image documentation was performed). It was removed over an 018 wire which was upsized to a [place]. The sheath was inserted over the wire and into the IVC. IVC venography was performed.  The temporary filter was then deployed in the infrarenal IVC.  The sheath was exchanged over a Bentson wire for a temporary dialysis catheter.  FINDINGS: IVC venography demonstrates renal vein inflow at L1 and no venous anomaly. No IVC thrombus.  The image demonstrates placement of an IVC filter with its tip at the L1 inferior end-plate.  Final image demonstrates a right internal jugular temporary dialysis catheter with its tip at the cavoatrial junction.  COMPLICATIONS: None  IMPRESSION: Successful infrarenal IVC filter placement. This is a temporary filter. It can be removed or remain in place to become permanent.  A right internal jugular temporary dialysis catheter was exchanged for venous access after IVC filter placement. This will prevent a bleeding complication at the internal jugular access site in preparation for venous lysis.   Electronically Signed   By: Maryclare Bean M.D.   On: 05/04/2013 13:48   Ir Fluoro Guide Cv Line Right  05/04/2013   CLINICAL DATA:  Right lower extremity DVT  EXAM: IVC FILTER,INFERIOR VENA CAVOGRAM, FLUOROSCOPIC GUIDANCE FOR CENTRAL VENOUS CATHETER PLACED  FLUOROSCOPY TIME:  1 min and 54 seconds.  MEDICATIONS AND MEDICAL HISTORY: Versed 1.5 mg, Fentanyl 75 mcg.  Additional  Medications: None.  ANESTHESIA/SEDATION: Moderate sedation time: 20 minutes  CONTRAST:  40 cc Omnipaque 300  PROCEDURE: The procedure, risks, benefits, and alternatives were explained to the patient. Questions regarding the procedure were encouraged and answered. The patient understands and consents to the procedure.  The right neck was prepped with Betadine in a sterile fashion, and a sterile drape was applied covering the operative field. A sterile gown and sterile gloves were used for the procedure.  The right internal jugular vein was noted  to be patent initially with ultrasound. Under sonographic guidance, a micropuncture needle was inserted into the right internal jugular vein (Ultrasound image documentation was performed). It was removed over an 018 wire which was upsized to a [place]. The sheath was inserted over the wire and into the IVC. IVC venography was performed.  The temporary filter was then deployed in the infrarenal IVC.  The sheath was exchanged over a Bentson wire for a temporary dialysis catheter.  FINDINGS: IVC venography demonstrates renal vein inflow at L1 and no venous anomaly. No IVC thrombus.  The image demonstrates placement of an IVC filter with its tip at the L1 inferior end-plate.  Final image demonstrates a right internal jugular temporary dialysis catheter with its tip at the cavoatrial junction.  COMPLICATIONS: None  IMPRESSION: Successful infrarenal IVC filter placement. This is a temporary filter. It can be removed or remain in place to become permanent.  A right internal jugular temporary dialysis catheter was exchanged for venous access after IVC filter placement. This will prevent a bleeding complication at the internal jugular access site in preparation for venous lysis.   Electronically Signed   By: Maryclare Bean M.D.   On: 05/04/2013 13:48   Ir US Guide Vasc Access Right  05/04/2013   CLINICAL DATA:  Right lower extremity DVT  EXAM: IR INFUSION THROMBOL VENOUS INITIAL (MS); IR  ULTRASOUND GUIDANCE VASC ACCESS RIGHT; RIGHT EXTREMITY VENOGRAPHY; INFERIOR VENA CAVOGRAM  FLUOROSCOPY TIME:  6 min and 6 seconds.  MEDICATIONS AND MEDICAL HISTORY: Versed 1.5 mg, Fentanyl 75 mcg.  Additional Medications: None.  ANESTHESIA/SEDATION: Moderate sedation time: 25 minutes  CONTRAST:  30 cc Omnipaque 300  PROCEDURE: The procedure, risks, benefits, and alternatives were explained to the patient. Questions regarding the procedure were encouraged and answered. The patient understands and consents to the procedure.  The right popliteal fossa in the prone position was prepped with Betadine in a sterile fashion, and a sterile drape was applied covering the operative field. A sterile gown and sterile gloves were used for the procedure.  Under sonographic guidance, a micropuncture needle was inserted into the thrombosis popliteal vein and removed over a 018 wire. This was up sized to a glidewire. A 7 French sheath was inserted over the glidewire into the popliteal vein. Right lower extremity venography was performed.  A Kumpe the catheter was advanced over the glidewire to the femoral vein. The catheter was slowly advanced over the wire with intermittent injections for venography, to the level of the IVC. IVC venography was performed.  The thrombus was noted to be somewhat hard preventing easy passage of the Kumpe the catheter. Angiojet and mechanical thrombectomy was therefore not performed today.  The Kumpe be was exchanged over a stiff glidewire for a 90 cm, 50 cm infusion length 5 French multi side hole catheter.  FINDINGS: Venography of the right lower extremity demonstrates massive DVT extending from the popliteal vein into the common iliac vein.  The final image demonstrates an infusion catheter extending from the right popliteal vein to the IVC. IVC filter is in place.  COMPLICATIONS: None  IMPRESSION: Successful establishment of access for right lower extremity venous lysis. TNK at 0.5 milligrams/hour  was instituted.   Electronically Signed   By: Maryclare Bean M.D.   On: 05/04/2013 14:00   Ct Angio Abd/pel W/ And/or W/o  05/03/2013   CLINICAL DATA:  Right lower extremity DVT. History of bilateral pulmonary embolism.  EXAM: CT ANGIOGRAPHY ABDOMEN AND PELVIS WITH CONTRAST AND WITHOUT  CONTRAST  TECHNIQUE: Multidetector CT imaging of the abdomen and pelvis was performed using the standard protocol during bolus administration of intravenous contrast. Multiplanar reconstructed images and MIPs were obtained and reviewed to evaluate the vascular anatomy.  CONTRAST:  OMNIPAQUE IOHEXOL 300 MG/ML  SOLN  COMPARISON:  CT ANGIO CHEST W/CM &/OR WO/CM dated 12/21/2010  FINDINGS: Beginning in the superior segment of the external iliac vein, thrombus is visualized in the right external iliac vein and right common femoral vein extending into the thigh. The common femoral vein is very distended with clot. Thrombus nearly extends into the right common iliac vein. No thrombus is identified in the IVC.  No mass-effect is identified on venous structures. There is no evidence of adenopathy. No left-sided femoral or iliac DVT is identified. The abdominal aorta is normally patent.  Visualized lung bases show a small right pleural effusion. The liver, gallbladder, pancreas, spleen, adrenal glands and kidneys are unremarkable. There is a simple cyst of the anterior left kidney measuring approximately 2 cm. No abnormal fluid collections are identified. The bladder is unremarkable. No bony abnormalities are seen.  Review of the MIP images confirms the above findings.  IMPRESSION: Right-sided iliofemoral DVT seen throughout the visualized common femoral and external iliac veins. There is no evidence of IVC thrombus or extrinsic mass effect on venous structures.   Electronically Signed   By: Irish Lack M.D.   On: 05/03/2013 17:07   Ir Infusion Thrombol Venous Initial (ms)  05/04/2013   CLINICAL DATA:  Right lower extremity DVT  EXAM:  IR INFUSION THROMBOL VENOUS INITIAL (MS); IR ULTRASOUND GUIDANCE VASC ACCESS RIGHT; RIGHT EXTREMITY VENOGRAPHY; INFERIOR VENA CAVOGRAM  FLUOROSCOPY TIME:  6 min and 6 seconds.  MEDICATIONS AND MEDICAL HISTORY: Versed 1.5 mg, Fentanyl 75 mcg.  Additional Medications: None.  ANESTHESIA/SEDATION: Moderate sedation time: 25 minutes  CONTRAST:  30 cc Omnipaque 300  PROCEDURE: The procedure, risks, benefits, and alternatives were explained to the patient. Questions regarding the procedure were encouraged and answered. The patient understands and consents to the procedure.  The right popliteal fossa in the prone position was prepped with Betadine in a sterile fashion, and a sterile drape was applied covering the operative field. A sterile gown and sterile gloves were used for the procedure.  Under sonographic guidance, a micropuncture needle was inserted into the thrombosis popliteal vein and removed over a 018 wire. This was up sized to a glidewire. A 7 French sheath was inserted over the glidewire into the popliteal vein. Right lower extremity venography was performed.  A Kumpe the catheter was advanced over the glidewire to the femoral vein. The catheter was slowly advanced over the wire with intermittent injections for venography, to the level of the IVC. IVC venography was performed.  The thrombus was noted to be somewhat hard preventing easy passage of the Kumpe the catheter. Angiojet and mechanical thrombectomy was therefore not performed today.  The Kumpe be was exchanged over a stiff glidewire for a 90 cm, 50 cm infusion length 5 French multi side hole catheter.  FINDINGS: Venography of the right lower extremity demonstrates massive DVT extending from the popliteal vein into the common iliac vein.  The final image demonstrates an infusion catheter extending from the right popliteal vein to the IVC. IVC filter is in place.  COMPLICATIONS: None  IMPRESSION: Successful establishment of access for right lower  extremity venous lysis. TNK at 0.5 milligrams/hour was instituted.   Electronically Signed   By: Mia Creek.D.  On: 05/04/2013 14:00    Scheduled Meds: . fentaNYL      . heparin      . midazolam      . sodium chloride  3 mL Intravenous Q12H  . sodium chloride  3 mL Intravenous Q12H   Continuous Infusions: . heparin 800 Units/hr (05/04/13 1600)  . tenecteplase (TNKase) infusion 0.5 mg/hr (05/04/13 1600)    Principal Problem:   Right leg DVT Active Problems:   Leukocytosis    Time spent: 35    Keishawn Rajewski C  [facility] Pager [phone]. If 7PM-7AM, please contact night-coverage at [email or web address], password [facility] 05/04/2013, 5:41 PM  LOS: 1 day

## 2013-05-04 NOTE — Sedation Documentation (Signed)
RN on 6E notified pt to be transferred to ICU and Karrie DoffingP Turpin PA notified to place orders

## 2013-05-05 ENCOUNTER — Inpatient Hospital Stay (HOSPITAL_COMMUNITY): Payer: 59

## 2013-05-05 LAB — CBC
HCT: 36.2 % — ABNORMAL LOW (ref 39.0–52.0)
HCT: 36.4 % — ABNORMAL LOW (ref 39.0–52.0)
HEMATOCRIT: 34.4 % — AB (ref 39.0–52.0)
Hemoglobin: 12 g/dL — ABNORMAL LOW (ref 13.0–17.0)
Hemoglobin: 12.1 g/dL — ABNORMAL LOW (ref 13.0–17.0)
Hemoglobin: 11.4 g/dL — ABNORMAL LOW (ref 13.0–17.0)
MCH: 23.8 pg — ABNORMAL LOW (ref 26.0–34.0)
MCH: 23.8 pg — ABNORMAL LOW (ref 26.0–34.0)
MCH: 23.8 pg — ABNORMAL LOW (ref 26.0–34.0)
MCHC: 33.1 g/dL (ref 30.0–36.0)
MCHC: 33.2 g/dL (ref 30.0–36.0)
MCHC: 33.1 g/dL (ref 30.0–36.0)
MCV: 71.7 fL — ABNORMAL LOW (ref 78.0–100.0)
MCV: 71.7 fL — ABNORMAL LOW (ref 78.0–100.0)
MCV: 71.7 fL — ABNORMAL LOW (ref 78.0–100.0)
PLATELETS: 156 10*3/uL (ref 150–400)
Platelets: 157 10*3/uL (ref 150–400)
Platelets: 139 10*3/uL — ABNORMAL LOW (ref 150–400)
RBC: 5.05 MIL/uL (ref 4.22–5.81)
RBC: 5.08 MIL/uL (ref 4.22–5.81)
RBC: 4.8 MIL/uL (ref 4.22–5.81)
RDW: 14.4 % (ref 11.5–15.5)
RDW: 14.5 % (ref 11.5–15.5)
RDW: 14.6 % (ref 11.5–15.5)
WBC: 8.3 10*3/uL (ref 4.0–10.5)
WBC: 8.5 10*3/uL (ref 4.0–10.5)
WBC: 7.7 10*3/uL (ref 4.0–10.5)

## 2013-05-05 LAB — FIBRINOGEN
FIBRINOGEN: 100 mg/dL — AB (ref 204–475)
Fibrinogen: 107 mg/dL — ABNORMAL LOW (ref 204–475)
Fibrinogen: 109 mg/dL — ABNORMAL LOW (ref 204–475)

## 2013-05-05 LAB — GLUCOSE, CAPILLARY: Glucose-Capillary: 105 mg/dL — ABNORMAL HIGH (ref 70–99)

## 2013-05-05 LAB — PROTEIN C, TOTAL: Protein C, Total: 46 % — ABNORMAL LOW (ref 72–160)

## 2013-05-05 LAB — HEPARIN LEVEL (UNFRACTIONATED)
Heparin Unfractionated: <0.1 IU/mL — ABNORMAL LOW (ref 0.30–0.70)
Heparin Unfractionated: <0.1 IU/mL — ABNORMAL LOW (ref 0.30–0.70)
Heparin Unfractionated: <0.1 IU/mL — ABNORMAL LOW (ref 0.30–0.70)

## 2013-05-05 LAB — PROTEIN S, TOTAL: Protein S Ag, Total: 113 % (ref 60–150)

## 2013-05-05 MED ORDER — FENTANYL CITRATE 0.05 MG/ML IJ SOLN
INTRAMUSCULAR | Status: AC
  Filled 2013-05-05: qty 4

## 2013-05-05 MED ORDER — GELATIN ABSORBABLE 12-7 MM EX MISC
CUTANEOUS | Status: AC
  Filled 2013-05-05: qty 1

## 2013-05-05 MED ORDER — FENTANYL CITRATE 0.05 MG/ML IJ SOLN
INTRAMUSCULAR | Status: AC | PRN
Start: 2013-05-05 — End: 2013-05-05
  Administered 2013-05-05: 50 ug via INTRAVENOUS

## 2013-05-05 MED ORDER — IOHEXOL 300 MG/ML  SOLN
100.0000 mL | Freq: Once | INTRAMUSCULAR | Status: AC | PRN
Start: 2013-05-05 — End: 2013-05-05
  Administered 2013-05-05: 30 mL via INTRAVENOUS

## 2013-05-05 MED ORDER — MIDAZOLAM HCL 2 MG/2ML IJ SOLN
INTRAMUSCULAR | Status: AC
  Filled 2013-05-05: qty 4

## 2013-05-05 MED ORDER — HEPARIN (PORCINE) IN NACL 100-0.45 UNIT/ML-% IJ SOLN
1350.0000 [IU]/h | INTRAMUSCULAR | Status: DC
  Administered 2013-05-05: 1250 [IU]/h via INTRAVENOUS
  Administered 2013-05-05: 1200 [IU]/h via INTRAVENOUS
  Administered 2013-05-06: 1250 [IU]/h via INTRAVENOUS
  Administered 2013-05-07 – 2013-05-08 (×2): 1300 [IU]/h via INTRAVENOUS
  Administered 2013-05-09: 1200 [IU]/h via INTRAVENOUS
  Administered 2013-05-09: 1300 [IU]/h via INTRAVENOUS
  Administered 2013-05-10: 1250 [IU]/h via INTRAVENOUS
  Administered 2013-05-11: 1350 [IU]/h via INTRAVENOUS
  Filled 2013-05-05 (×13): qty 250

## 2013-05-05 MED ORDER — MIDAZOLAM HCL 2 MG/2ML IJ SOLN
INTRAMUSCULAR | Status: AC | PRN
  Administered 2013-05-05: 1 mg via INTRAVENOUS

## 2013-05-05 NOTE — Progress Notes (Signed)
Subjective: Rt lower extr venous lysis in process Started 3/19 in IR Pt resting Holding head to left--Rt IJ cath oozing at site Rt Leg seems less swollen  Objective: Vital signs in last 24 hours: Temp:  [98.3 F (36.8 C)-99.1 F (37.3 C)] 99 F (37.2 C) (03/20 0500) Pulse Rate:  [56-77] 62 (03/20 0530) Resp:  [12-25] 19 (03/20 0530) BP: (104-144)/(54-82) 110/67 mmHg (03/20 0500) SpO2:  [94 %-100 %] 97 % (03/20 0530) Weight:  [75 kg (165 lb 5.5 oz)] 75 kg (165 lb 5.5 oz) (03/20 0500) Last BM Date: 05/02/13  Intake/Output from previous day: 03/19 0701 - 03/20 0700 In: 1587.1 [P.O.:240; I.V.:523; IV Piggyback:824.1] Out: 500 [Urine:500] Intake/Output this shift:    PE:  T: 99 VSS Pleasant Holding head to Lt; R IJ line is tender and oozing Pressure dressing on for now Rt posterior leg sheath intact; NT; no bleeding H/H stable Fibrinogen 107 Rt leg may be slightly less swollen; NT Rt foot with good pulse Toes with some ecchymotic appearance  Lab Results:   Recent Labs  05/05/13 0054 05/05/13 0500  WBC 8.3 8.5  HGB 12.1* 12.0*  HCT 36.4* 36.2*  PLT 156 157   BMET  Recent Labs  05/03/13 0950 05/04/13 0449  NA 137 136*  K 4.1 4.1  CL 95* 100  CO2 24 23  GLUCOSE 136* 87  BUN 19 14  CREATININE 1.25 1.34  CALCIUM 10.1 8.9   PT/INR  Recent Labs  05/03/13 0950 05/03/13 1928  LABPROT 13.1 12.9  INR 1.01 0.99   ABG No results found for this basename: PHART, PCO2, PO2, HCO3,  in the last 72 hours  Studies/Results: Ir Veno/ext/uni Right  05/04/2013   CLINICAL DATA:  Right lower extremity DVT  EXAM: IR INFUSION THROMBOL VENOUS INITIAL (MS); IR ULTRASOUND GUIDANCE VASC ACCESS RIGHT; RIGHT EXTREMITY VENOGRAPHY; INFERIOR VENA CAVOGRAM  FLUOROSCOPY TIME:  6 min and 6 seconds.  MEDICATIONS AND MEDICAL HISTORY: Versed 1.5 mg, Fentanyl 75 mcg.  Additional Medications: None.  ANESTHESIA/SEDATION: Moderate sedation time: 25 minutes  CONTRAST:  30 cc Omnipaque  300  PROCEDURE: The procedure, risks, benefits, and alternatives were explained to the patient. Questions regarding the procedure were encouraged and answered. The patient understands and consents to the procedure.  The right popliteal fossa in the prone position was prepped with Betadine in a sterile fashion, and a sterile drape was applied covering the operative field. A sterile gown and sterile gloves were used for the procedure.  Under sonographic guidance, a micropuncture needle was inserted into the thrombosis popliteal vein and removed over a 018 wire. This was up sized to a glidewire. A 7 French sheath was inserted over the glidewire into the popliteal vein. Right lower extremity venography was performed.  A Kumpe the catheter was advanced over the glidewire to the femoral vein. The catheter was slowly advanced over the wire with intermittent injections for venography, to the level of the IVC. IVC venography was performed.  The thrombus was noted to be somewhat hard preventing easy passage of the Kumpe the catheter. Angiojet and mechanical thrombectomy was therefore not performed today.  The Kumpe be was exchanged over a stiff glidewire for a 90 cm, 50 cm infusion length 5 French multi side hole catheter.  FINDINGS: Venography of the right lower extremity demonstrates massive DVT extending from the popliteal vein into the common iliac vein.  The final image demonstrates an infusion catheter extending from the right popliteal vein to the IVC. IVC filter  is in place.  COMPLICATIONS: None  IMPRESSION: Successful establishment of access for right lower extremity venous lysis. TNK at 0.5 milligrams/hour was instituted.   Electronically Signed   By: Maryclare BeanArt  Hoss M.D.   On: 05/04/2013 14:00   Ir Venocavagram Ivc  05/04/2013   CLINICAL DATA:  Right lower extremity DVT  EXAM: IR INFUSION THROMBOL VENOUS INITIAL (MS); IR ULTRASOUND GUIDANCE VASC ACCESS RIGHT; RIGHT EXTREMITY VENOGRAPHY; INFERIOR VENA CAVOGRAM   FLUOROSCOPY TIME:  6 min and 6 seconds.  MEDICATIONS AND MEDICAL HISTORY: Versed 1.5 mg, Fentanyl 75 mcg.  Additional Medications: None.  ANESTHESIA/SEDATION: Moderate sedation time: 25 minutes  CONTRAST:  30 cc Omnipaque 300  PROCEDURE: The procedure, risks, benefits, and alternatives were explained to the patient. Questions regarding the procedure were encouraged and answered. The patient understands and consents to the procedure.  The right popliteal fossa in the prone position was prepped with Betadine in a sterile fashion, and a sterile drape was applied covering the operative field. A sterile gown and sterile gloves were used for the procedure.  Under sonographic guidance, a micropuncture needle was inserted into the thrombosis popliteal vein and removed over a 018 wire. This was up sized to a glidewire. A 7 French sheath was inserted over the glidewire into the popliteal vein. Right lower extremity venography was performed.  A Kumpe the catheter was advanced over the glidewire to the femoral vein. The catheter was slowly advanced over the wire with intermittent injections for venography, to the level of the IVC. IVC venography was performed.  The thrombus was noted to be somewhat hard preventing easy passage of the Kumpe the catheter. Angiojet and mechanical thrombectomy was therefore not performed today.  The Kumpe be was exchanged over a stiff glidewire for a 90 cm, 50 cm infusion length 5 French multi side hole catheter.  FINDINGS: Venography of the right lower extremity demonstrates massive DVT extending from the popliteal vein into the common iliac vein.  The final image demonstrates an infusion catheter extending from the right popliteal vein to the IVC. IVC filter is in place.  COMPLICATIONS: None  IMPRESSION: Successful establishment of access for right lower extremity venous lysis. TNK at 0.5 milligrams/hour was instituted.   Electronically Signed   By: Maryclare BeanArt  Hoss M.D.   On: 05/04/2013 14:00   Ir  Ivc Filter Plmt / S&i /img Guid/mod Sed  05/04/2013   CLINICAL DATA:  Right lower extremity DVT  EXAM: IVC FILTER,INFERIOR VENA CAVOGRAM, FLUOROSCOPIC GUIDANCE FOR CENTRAL VENOUS CATHETER PLACED  FLUOROSCOPY TIME:  1 min and 54 seconds.  MEDICATIONS AND MEDICAL HISTORY: Versed 1.5 mg, Fentanyl 75 mcg.  Additional Medications: None.  ANESTHESIA/SEDATION: Moderate sedation time: 20 minutes  CONTRAST:  40 cc Omnipaque 300  PROCEDURE: The procedure, risks, benefits, and alternatives were explained to the patient. Questions regarding the procedure were encouraged and answered. The patient understands and consents to the procedure.  The right neck was prepped with Betadine in a sterile fashion, and a sterile drape was applied covering the operative field. A sterile gown and sterile gloves were used for the procedure.  The right internal jugular vein was noted to be patent initially with ultrasound. Under sonographic guidance, a micropuncture needle was inserted into the right internal jugular vein (Ultrasound image documentation was performed). It was removed over an 018 wire which was upsized to a [place]. The sheath was inserted over the wire and into the IVC. IVC venography was performed.  The temporary filter was then deployed in  the infrarenal IVC.  The sheath was exchanged over a Bents[place] wire for a temporary dialysis catheter.  FINDINGS: IVC venography demonstrates renal vein inflow at L1 and no venous anomaly. No IVC thrombus.  The image demonstrates placement of an IVC filter with its tip at the L1 inferior end-plate.  Final image demonstrates a right internal jugular temporary dialysis catheter with its tip at the cavoatrial junction.  COMPLICATIONS: None  IMPRESSION: Successful infrarenal IVC filter placement. This is a temporary filter. It can be removed or remain in place to become permanent.  A right internal jugular temporary dialysis catheter was exchanged for venous access after IVC filter placement. This  will prevent a bleeding complication at the internal jugular access site in preparation for venous lysis.   Electronically Signed   By: Maryclare Bean M.D.   On: 05/04/2013 13:48   Ir Fluoro Guide Cv Line Right  05/04/2013   CLINICAL DATA:  Right lower extremity DVT  EXAM: IVC FILTER,INFERIOR VENA CAVOGRAM, FLUOROSCOPIC GUIDANCE FOR CENTRAL VENOUS CATHETER PLACED  FLUOROSCOPY TIME:  1 min and 54 seconds.  MEDICATIONS AND MEDICAL HISTORY: Versed 1.5 mg, Fentanyl 75 mcg.  Additional Medications: None.  ANESTHESIA/SEDATION: Moderate sedation time: 20 minutes  CONTRAST:  40 cc Omnipaque 300  PROCEDURE: The procedure, risks, benefits, and alternatives were explained to the patient. Questions regarding the procedure were encouraged and answered. The patient understands and consents to the procedure.  The right neck was prepped with Betadine in a sterile fashion, and a sterile drape was applied covering the operative field. A sterile gown and sterile gloves were used for the procedure.  The right internal jugular vein was noted to be patent initially with ultrasound. Under sonographic guidance, a micropuncture needle was inserted into the right internal jugular vein (Ultrasound image documentation was performed). It was removed over an 018 wire which was upsized to a [place]. The sheath was inserted over the wire and into the IVC. IVC venography was performed.  The temporary filter was then deployed in the infrarenal IVC.  The sheath was exchanged over a Bentson wire for a temporary dialysis catheter.  FINDINGS: IVC venography demonstrates renal vein inflow at L1 and no venous anomaly. No IVC thrombus.  The image demonstrates placement of an IVC filter with its tip at the L1 inferior end-plate.  Final image demonstrates a right internal jugular temporary dialysis catheter with its tip at the cavoatrial junction.  COMPLICATIONS: None  IMPRESSION: Successful infrarenal IVC filter placement. This is a temporary filter. It can be  removed or remain in place to become permanent.  A right internal jugular temporary dialysis catheter was exchanged for venous access after IVC filter placement. This will prevent a bleeding complication at the internal jugular access site in preparation for venous lysis.   Electronically Signed   By: Maryclare Bean M.D.   On: 05/04/2013 13:48   Ir US Guide Vasc Access Right  05/04/2013   CLINICAL DATA:  Right lower extremity DVT  EXAM: IR INFUSION THROMBOL VENOUS INITIAL (MS); IR ULTRASOUND GUIDANCE VASC ACCESS RIGHT; RIGHT EXTREMITY VENOGRAPHY; INFERIOR VENA CAVOGRAM  FLUOROSCOPY TIME:  6 min and 6 seconds.  MEDICATIONS AND MEDICAL HISTORY: Versed 1.5 mg, Fentanyl 75 mcg.  Additional Medications: None.  ANESTHESIA/SEDATION: Moderate sedation time: 25 minutes  CONTRAST:  30 cc Omnipaque 300  PROCEDURE: The procedure, risks, benefits, and alternatives were explained to the patient. Questions regarding the procedure were encouraged and answered. The patient understands and consents to the procedure.  The right popliteal fossa in the prone position was prepped with Betadine in a sterile fashion, and a sterile drape was applied covering the operative field. A sterile gown and sterile gloves were used for the procedure.  Under sonographic guidance, a micropuncture needle was inserted into the thrombosis popliteal vein and removed over a 018 wire. This was up sized to a glidewire. A 7 French sheath was inserted over the glidewire into the popliteal vein. Right lower extremity venography was performed.  A Kumpe the catheter was advanced over the glidewire to the femoral vein. The catheter was slowly advanced over the wire with intermittent injections for venography, to the level of the IVC. IVC venography was performed.  The thrombus was noted to be somewhat hard preventing easy passage of the Kumpe the catheter. Angiojet and mechanical thrombectomy was therefore not performed today.  The Kumpe be was exchanged over a stiff  glidewire for a 90 cm, 50 cm infusion length 5 French multi side hole catheter.  FINDINGS: Venography of the right lower extremity demonstrates massive DVT extending from the popliteal vein into the common iliac vein.  The final image demonstrates an infusion catheter extending from the right popliteal vein to the IVC. IVC filter is in place.  COMPLICATIONS: None  IMPRESSION: Successful establishment of access for right lower extremity venous lysis. TNK at 0.5 milligrams/hour was instituted.   Electronically Signed   By: Maryclare Bean M.D.   On: 05/04/2013 14:00   Ct Angio Abd/pel W/ And/or W/o  05/03/2013   CLINICAL DATA:  Right lower extremity DVT. History of bilateral pulmonary embolism.  EXAM: CT ANGIOGRAPHY ABDOMEN AND PELVIS WITH CONTRAST AND WITHOUT CONTRAST  TECHNIQUE: Multidetector CT imaging of the abdomen and pelvis was performed using the standard protocol during bolus administration of intravenous contrast. Multiplanar reconstructed images and MIPs were obtained and reviewed to evaluate the vascular anatomy.  CONTRAST:  OMNIPAQUE IOHEXOL 300 MG/ML  SOLN  COMPARISON:  CT ANGIO CHEST W/CM &/OR WO/CM dated 12/21/2010  FINDINGS: Beginning in the superior segment of the external iliac vein, thrombus is visualized in the right external iliac vein and right common femoral vein extending into the thigh. The common femoral vein is very distended with clot. Thrombus nearly extends into the right common iliac vein. No thrombus is identified in the IVC.  No mass-effect is identified on venous structures. There is no evidence of adenopathy. No left-sided femoral or iliac DVT is identified. The abdominal aorta is normally patent.  Visualized lung bases show a small right pleural effusion. The liver, gallbladder, pancreas, spleen, adrenal glands and kidneys are unremarkable. There is a simple cyst of the anterior left kidney measuring approximately 2 cm. No abnormal fluid collections are identified. The bladder is  unremarkable. No bony abnormalities are seen.  Review of the MIP images confirms the above findings.  IMPRESSION: Right-sided iliofemoral DVT seen throughout the visualized common femoral and external iliac veins. There is no evidence of IVC thrombus or extrinsic mass effect on venous structures.   Electronically Signed   By: Irish Lack M.D.   On: 05/03/2013 17:07   Ir Infusion Thrombol Venous Initial (ms)  05/04/2013   CLINICAL DATA:  Right lower extremity DVT  EXAM: IR INFUSION THROMBOL VENOUS INITIAL (MS); IR ULTRASOUND GUIDANCE VASC ACCESS RIGHT; RIGHT EXTREMITY VENOGRAPHY; INFERIOR VENA CAVOGRAM  FLUOROSCOPY TIME:  6 min and 6 seconds.  MEDICATIONS AND MEDICAL HISTORY: Versed 1.5 mg, Fentanyl 75 mcg.  Additional Medications: None.  ANESTHESIA/SEDATION: Moderate sedation  time: 25 minutes  CONTRAST:  30 cc Omnipaque 300  PROCEDURE: The procedure, risks, benefits, and alternatives were explained to the patient. Questions regarding the procedure were encouraged and answered. The patient understands and consents to the procedure.  The right popliteal fossa in the prone position was prepped with Betadine in a sterile fashion, and a sterile drape was applied covering the operative field. A sterile gown and sterile gloves were used for the procedure.  Under sonographic guidance, a micropuncture needle was inserted into the thrombosis popliteal vein and removed over a 018 wire. This was up sized to a glidewire. A 7 French sheath was inserted over the glidewire into the popliteal vein. Right lower extremity venography was performed.  A Kumpe the catheter was advanced over the glidewire to the femoral vein. The catheter was slowly advanced over the wire with intermittent injections for venography, to the level of the IVC. IVC venography was performed.  The thrombus was noted to be somewhat hard preventing easy passage of the Kumpe the catheter. Angiojet and mechanical thrombectomy was therefore not performed  today.  The Kumpe be was exchanged over a stiff glidewire for a 90 cm, 50 cm infusion length 5 French multi side hole catheter.  FINDINGS: Venography of the right lower extremity demonstrates massive DVT extending from the popliteal vein into the common iliac vein.  The final image demonstrates an infusion catheter extending from the right popliteal vein to the IVC. IVC filter is in place.  COMPLICATIONS: None  IMPRESSION: Successful establishment of access for right lower extremity venous lysis. TNK at 0.5 milligrams/hour was instituted.   Electronically Signed   By: Maryclare Bean M.D.   On: 05/04/2013 14:00    Anti-infectives: Anti-infectives   None      Assessment/Plan: s/p * No surgery found *  Rt lower extr venous lysis in process Oozing at Rt IJ site and IV site in Lt arm- controlled For re check this am in IR   LOS: 2 days    Celester Morgan A 05/05/2013

## 2013-05-05 NOTE — Progress Notes (Signed)
Agree with above.  Will bring down for angiography this AM to check progress of lysis of right LE DVT.

## 2013-05-05 NOTE — Progress Notes (Signed)
ANTICOAGULATION CONSULT NOTE  Pharmacy Consult for Heparin  Indication: DVT  No Known Allergies  Patient Measurements: Height: 5\' 4"  (162.6 cm) Weight: 161 lb 11.2 oz (73.347 kg) IBW/kg (Calculated) : 59.2 Last documented weight ~ 78 kg (12/14/12) Heparin Dosing Weight: 74 kg  Vital Signs: Temp: 99 F (37.2 C) (03/20 0500) Temp src: Oral (03/20 0500) BP: 110/67 mmHg (03/20 0500) Pulse Rate: 61 (03/20 0500)  Labs:  Recent Labs  05/03/13 0950 05/03/13 1928  05/04/13 0449  05/04/13 1815 05/04/13 2000 05/05/13 0054 05/05/13 0500  HGB 14.3  --   --  13.1  < > 12.8*  --  12.1* 12.0*  HCT 42.3  --   --  39.0  < > 37.8*  --  36.4* 36.2*  PLT 208  --   --  195  < > 171  --  156 157  APTT 29  --   --   --   --   --   --   --   --   LABPROT 13.1 12.9  --   --   --   --   --   --   --   INR 1.01 0.99  --   --   --   --   --   --   --   HEPARINUNFRC  --  0.82*  < >  --   < > <0.10* <0.10* <0.10* <0.10*  CREATININE 1.25  --   --  1.34  --   --   --   --   --   < > = values in this interval not displayed.  Estimated Creatinine Clearance: 67.8 ml/min (by C-G formula based on Cr of 1.34).  Assessment: 38 yo male with RLE DVT s/p lysis, on TNKase, for heparin.  RN noted some oozing around IV site and sheath site.  H/H stable  Goal of Therapy:  Heparin level 0.2 - 0.5  Monitor platelets by anticoagulation protocol: Yes   Plan: Increase Heparin 1100 units/hr  Geannie RisenGreg Savas Elvin, PharmD, BCPS 05/05/2013 5:54 AM

## 2013-05-05 NOTE — Progress Notes (Signed)
Nursing: Hematoma noted at old lab draw site on rt hand. Hand elevated on pillow and hot pack placed over area. Patient reported mild discomfort with joint movement of that hand . [facility]. Pressure dressing applied.

## 2013-05-05 NOTE — Procedures (Signed)
Procedure:  Follow up angiography of right LE veins during thrombolytic therapy Findings:  Very little lysis of thrombus in thigh and iliac veins. Fibrinogen 100 this AM with oozing from right neck cath site, IV sites and right popliteal site. Plan:  Replace thrombolytic infusion with saline temporarily and recheck fibrinogen at noon.  May be able to then restart TNK at a lower dose. Will check right neck CVC site now and potentially place pursestring suture.

## 2013-05-05 NOTE — Progress Notes (Signed)
Utilization review completed. Lynnette Pote, RN, BSN. 

## 2013-05-05 NOTE — Progress Notes (Signed)
Subjective: RLE venous thrombolysis was started 3/19 in IR Thrombolytics overnight Re imaging this am shows very little progress- old chronic clotting  DC'd lytics and heparin at 1030 am Repeated fibrinogen at 100pm: 109 Cannot safely move forward with further thrombolytics per Dr Fredia Sorrow IR will stop procedure Pulled RLE venous sheath at bedside  Objective: Vital signs in last 24 hours: Temp:  [98.2 F (36.8 C)-99.1 F (37.3 C)] 98.2 F (36.8 C) (03/20 0800) Pulse Rate:  [57-77] 65 (03/20 1025) Resp:  [10-25] 10 (03/20 1017) BP: (101-144)/(42-82) 131/60 mmHg (03/20 1025) SpO2:  [95 %-100 %] 98 % (03/20 1025) Weight:  [75 kg (165 lb 5.5 oz)] 75 kg (165 lb 5.5 oz) (03/20 0500) Last BM Date: 05/02/13  Intake/Output from previous day: 03/19 0701 - 03/20 0700 In: 1587.1 [P.O.:240; I.V.:523; IV Piggyback:824.1] Out: 500 [Urine:500] Intake/Output this shift: Total I/O In: 232 [I.V.:132; IV Piggyback:100] Out: -   PE:  VSS; afeb Fibrinogen 109 RLE venous sheath removed at bedside without complication Pressure dressing applied.   Lab Results:   Recent Labs  05/05/13 0500 05/05/13 1255  WBC 8.5 7.7  HGB 12.0* 11.4*  HCT 36.2* 34.4*  PLT 157 139*   BMET  Recent Labs  05/03/13 0950 05/04/13 0449  NA 137 136*  K 4.1 4.1  CL 95* 100  CO2 24 23  GLUCOSE 136* 87  BUN 19 14  CREATININE 1.25 1.34  CALCIUM 10.1 8.9   PT/INR  Recent Labs  05/03/13 0950 05/03/13 1928  LABPROT 13.1 12.9  INR 1.01 0.99   ABG No results found for this basename: PHART, PCO2, PO2, HCO3,  in the last 72 hours  Studies/Results: Ir Veno/ext/uni Right  05/04/2013   CLINICAL DATA:  Right lower extremity DVT  EXAM: IR INFUSION THROMBOL VENOUS INITIAL (MS); IR ULTRASOUND GUIDANCE VASC ACCESS RIGHT; RIGHT EXTREMITY VENOGRAPHY; INFERIOR VENA CAVOGRAM  FLUOROSCOPY TIME:  6 min and 6 seconds.  MEDICATIONS AND MEDICAL HISTORY: Versed 1.5 mg, Fentanyl 75 mcg.  Additional Medications:  None.  ANESTHESIA/SEDATION: Moderate sedation time: 25 minutes  CONTRAST:  30 cc Omnipaque 300  PROCEDURE: The procedure, risks, benefits, and alternatives were explained to the patient. Questions regarding the procedure were encouraged and answered. The patient understands and consents to the procedure.  The right popliteal fossa in the prone position was prepped with Betadine in a sterile fashion, and a sterile drape was applied covering the operative field. A sterile gown and sterile gloves were used for the procedure.  Under sonographic guidance, a micropuncture needle was inserted into the thrombosis popliteal vein and removed over a 018 wire. This was up sized to a glidewire. A 7 French sheath was inserted over the glidewire into the popliteal vein. Right lower extremity venography was performed.  A Kumpe the catheter was advanced over the glidewire to the femoral vein. The catheter was slowly advanced over the wire with intermittent injections for venography, to the level of the IVC. IVC venography was performed.  The thrombus was noted to be somewhat hard preventing easy passage of the Kumpe the catheter. Angiojet and mechanical thrombectomy was therefore not performed today.  The Kumpe be was exchanged over a stiff glidewire for a 90 cm, 50 cm infusion length 5 French multi side hole catheter.  FINDINGS: Venography of the right lower extremity demonstrates massive DVT extending from the popliteal vein into the common iliac vein.  The final image demonstrates an infusion catheter extending from the right popliteal vein to the IVC. IVC  filter is in place.  COMPLICATIONS: None  IMPRESSION: Successful establishment of access for right lower extremity venous lysis. TNK at 0.5 milligrams/hour was instituted.   Electronically Signed   By: Maryclare Bean M.D.   On: 05/04/2013 14:00   Ir Venocavagram Ivc  05/04/2013   CLINICAL DATA:  Right lower extremity DVT  EXAM: IR INFUSION THROMBOL VENOUS INITIAL (MS); IR  ULTRASOUND GUIDANCE VASC ACCESS RIGHT; RIGHT EXTREMITY VENOGRAPHY; INFERIOR VENA CAVOGRAM  FLUOROSCOPY TIME:  6 min and 6 seconds.  MEDICATIONS AND MEDICAL HISTORY: Versed 1.5 mg, Fentanyl 75 mcg.  Additional Medications: None.  ANESTHESIA/SEDATION: Moderate sedation time: 25 minutes  CONTRAST:  30 cc Omnipaque 300  PROCEDURE: The procedure, risks, benefits, and alternatives were explained to the patient. Questions regarding the procedure were encouraged and answered. The patient understands and consents to the procedure.  The right popliteal fossa in the prone position was prepped with Betadine in a sterile fashion, and a sterile drape was applied covering the operative field. A sterile gown and sterile gloves were used for the procedure.  Under sonographic guidance, a micropuncture needle was inserted into the thrombosis popliteal vein and removed over a 018 wire. This was up sized to a glidewire. A 7 French sheath was inserted over the glidewire into the popliteal vein. Right lower extremity venography was performed.  A Kumpe the catheter was advanced over the glidewire to the femoral vein. The catheter was slowly advanced over the wire with intermittent injections for venography, to the level of the IVC. IVC venography was performed.  The thrombus was noted to be somewhat hard preventing easy passage of the Kumpe the catheter. Angiojet and mechanical thrombectomy was therefore not performed today.  The Kumpe be was exchanged over a stiff glidewire for a 90 cm, 50 cm infusion length 5 French multi side hole catheter.  FINDINGS: Venography of the right lower extremity demonstrates massive DVT extending from the popliteal vein into the common iliac vein.  The final image demonstrates an infusion catheter extending from the right popliteal vein to the IVC. IVC filter is in place.  COMPLICATIONS: None  IMPRESSION: Successful establishment of access for right lower extremity venous lysis. TNK at 0.5 milligrams/hour  was instituted.   Electronically Signed   By: Maryclare Bean M.D.   On: 05/04/2013 14:00   Ir Ivc Filter Plmt / S&i /img Guid/mod Sed  05/04/2013   CLINICAL DATA:  Right lower extremity DVT  EXAM: IVC FILTER,INFERIOR VENA CAVOGRAM, FLUOROSCOPIC GUIDANCE FOR CENTRAL VENOUS CATHETER PLACED  FLUOROSCOPY TIME:  1 min and 54 seconds.  MEDICATIONS AND MEDICAL HISTORY: Versed 1.5 mg, Fentanyl 75 mcg.  Additional Medications: None.  ANESTHESIA/SEDATION: Moderate sedation time: 20 minutes  CONTRAST:  40 cc Omnipaque 300  PROCEDURE: The procedure, risks, benefits, and alternatives were explained to the patient. Questions regarding the procedure were encouraged and answered. The patient understands and consents to the procedure.  The right neck was prepped with Betadine in a sterile fashion, and a sterile drape was applied covering the operative field. A sterile gown and sterile gloves were used for the procedure.  The right internal jugular vein was noted to be patent initially with ultrasound. Under sonographic guidance, a micropuncture needle was inserted into the right internal jugular vein (Ultrasound image documentation was performed). It was removed over an 018 wire which was upsized to a [place]. The sheath was inserted over the wire and into the IVC. IVC venography was performed.  The temporary filter was then deployed  in the infrarenal IVC.  The sheath was exchanged over a [place] wire for a temporary dialysis catheter.  FINDINGS: IVC venography demonstrates renal vein inflow at L1 and no venous anomaly. No IVC thrombus.  The image demonstrates placement of an IVC filter with its tip at the L1 inferior end-plate.  Final image demonstrates a right internal jugular temporary dialysis catheter with its tip at the cavoatrial junction.  COMPLICATIONS: None  IMPRESSION: Successful infrarenal IVC filter placement. This is a temporary filter. It can be removed or remain in place to become permanent.  A right internal jugular  temporary dialysis catheter was exchanged for venous access after IVC filter placement. This will prevent a bleeding complication at the internal jugular access site in preparation for venous lysis.   Electronically Signed   By: Maryclare BeanArt  Hoss M.D.   On: 05/04/2013 13:48   Ir Fluoro Guide Cv Line Right  05/04/2013   CLINICAL DATA:  Right lower extremity DVT  EXAM: IVC FILTER,INFERIOR VENA CAVOGRAM, FLUOROSCOPIC GUIDANCE FOR CENTRAL VENOUS CATHETER PLACED  FLUOROSCOPY TIME:  1 min and 54 seconds.  MEDICATIONS AND MEDICAL HISTORY: Versed 1.5 mg, Fentanyl 75 mcg.  Additional Medications: None.  ANESTHESIA/SEDATION: Moderate sedation time: 20 minutes  CONTRAST:  40 cc Omnipaque 300  PROCEDURE: The procedure, risks, benefits, and alternatives were explained to the patient. Questions regarding the procedure were encouraged and answered. The patient understands and consents to the procedure.  The right neck was prepped with Betadine in a sterile fashion, and a sterile drape was applied covering the operative field. A sterile gown and sterile gloves were used for the procedure.  The right internal jugular vein was noted to be patent initially with ultrasound. Under sonographic guidance, a micropuncture needle was inserted into the right internal jugular vein (Ultrasound image documentation was performed). It was removed over an 018 wire which was upsized to a [place]. The sheath was inserted over the wire and into the IVC. IVC venography was performed.  The temporary filter was then deployed in the infrarenal IVC.  The sheath was exchanged over a Bentson wire for a temporary dialysis catheter.  FINDINGS: IVC venography demonstrates renal vein inflow at L1 and no venous anomaly. No IVC thrombus.  The image demonstrates placement of an IVC filter with its tip at the L1 inferior end-plate.  Final image demonstrates a right internal jugular temporary dialysis catheter with its tip at the cavoatrial junction.  COMPLICATIONS: None   IMPRESSION: Successful infrarenal IVC filter placement. This is a temporary filter. It can be removed or remain in place to become permanent.  A right internal jugular temporary dialysis catheter was exchanged for venous access after IVC filter placement. This will prevent a bleeding complication at the internal jugular access site in preparation for venous lysis.   Electronically Signed   By: Maryclare BeanArt  Hoss M.D.   On: 05/04/2013 13:48   Ir [place] Guide Vasc Access Right  05/04/2013   CLINICAL DATA:  Right lower extremity DVT  EXAM: IR INFUSION THROMBOL VENOUS INITIAL (MS); IR ULTRASOUND GUIDANCE VASC ACCESS RIGHT; RIGHT EXTREMITY VENOGRAPHY; INFERIOR VENA CAVOGRAM  FLUOROSCOPY TIME:  6 min and 6 seconds.  MEDICATIONS AND MEDICAL HISTORY: Versed 1.5 mg, Fentanyl 75 mcg.  Additional Medications: None.  ANESTHESIA/SEDATION: Moderate sedation time: 25 minutes  CONTRAST:  30 cc Omnipaque 300  PROCEDURE: The procedure, risks, benefits, and alternatives were explained to the patient. Questions regarding the procedure were encouraged and answered. The patient understands and consents to the procedure.  The right popliteal fossa in the prone position was prepped with Betadine in a sterile fashion, and a sterile drape was applied covering the operative field. A sterile gown and sterile gloves were used for the procedure.  Under sonographic guidance, a micropuncture needle was inserted into the thrombosis popliteal vein and removed over a 018 wire. This was up sized to a glidewire. A 7 French sheath was inserted over the glidewire into the popliteal vein. Right lower extremity venography was performed.  A Kumpe the catheter was advanced over the glidewire to the femoral vein. The catheter was slowly advanced over the wire with intermittent injections for venography, to the level of the IVC. IVC venography was performed.  The thrombus was noted to be somewhat hard preventing easy passage of the Kumpe the catheter. Angiojet and  mechanical thrombectomy was therefore not performed today.  The Kumpe be was exchanged over a stiff glidewire for a 90 cm, 50 cm infusion length 5 French multi side hole catheter.  FINDINGS: Venography of the right lower extremity demonstrates massive DVT extending from the popliteal vein into the common iliac vein.  The final image demonstrates an infusion catheter extending from the right popliteal vein to the IVC. IVC filter is in place.  COMPLICATIONS: None  IMPRESSION: Successful establishment of access for right lower extremity venous lysis. TNK at 0.5 milligrams/hour was instituted.   Electronically Signed   By: Maryclare Bean M.D.   On: 05/04/2013 14:00   Ct Angio Abd/pel W/ And/or W/o  05/03/2013   CLINICAL DATA:  Right lower extremity DVT. History of bilateral pulmonary embolism.  EXAM: CT ANGIOGRAPHY ABDOMEN AND PELVIS WITH CONTRAST AND WITHOUT CONTRAST  TECHNIQUE: Multidetector CT imaging of the abdomen and pelvis was performed using the standard protocol during bolus administration of intravenous contrast. Multiplanar reconstructed images and MIPs were obtained and reviewed to evaluate the vascular anatomy.  CONTRAST:  OMNIPAQUE IOHEXOL 300 MG/ML  SOLN  COMPARISON:  CT ANGIO CHEST W/CM &/OR WO/CM dated 12/21/2010  FINDINGS: Beginning in the superior segment of the external iliac vein, thrombus is visualized in the right external iliac vein and right common femoral vein extending into the thigh. The common femoral vein is very distended with clot. Thrombus nearly extends into the right common iliac vein. No thrombus is identified in the IVC.  No mass-effect is identified on venous structures. There is no evidence of adenopathy. No left-sided femoral or iliac DVT is identified. The abdominal aorta is normally patent.  Visualized lung bases show a small right pleural effusion. The liver, gallbladder, pancreas, spleen, adrenal glands and kidneys are unremarkable. There is a simple cyst of the anterior  left kidney measuring approximately 2 cm. No abnormal fluid collections are identified. The bladder is unremarkable. No bony abnormalities are seen.  Review of the MIP images confirms the above findings.  IMPRESSION: Right-sided iliofemoral DVT seen throughout the visualized common femoral and external iliac veins. There is no evidence of IVC thrombus or extrinsic mass effect on venous structures.   Electronically Signed   By: Irish Lack M.D.   On: 05/03/2013 17:07   Ir Infusion Thrombol Venous Initial (ms)  05/04/2013   CLINICAL DATA:  Right lower extremity DVT  EXAM: IR INFUSION THROMBOL VENOUS INITIAL (MS); IR ULTRASOUND GUIDANCE VASC ACCESS RIGHT; RIGHT EXTREMITY VENOGRAPHY; INFERIOR VENA CAVOGRAM  FLUOROSCOPY TIME:  6 min and 6 seconds.  MEDICATIONS AND MEDICAL HISTORY: Versed 1.5 mg, Fentanyl 75 mcg.  Additional Medications: None.  ANESTHESIA/SEDATION: Moderate sedation  time: 25 minutes  CONTRAST:  30 cc Omnipaque 300  PROCEDURE: The procedure, risks, benefits, and alternatives were explained to the patient. Questions regarding the procedure were encouraged and answered. The patient understands and consents to the procedure.  The right popliteal fossa in the prone position was prepped with Betadine in a sterile fashion, and a sterile drape was applied covering the operative field. A sterile gown and sterile gloves were used for the procedure.  Under sonographic guidance, a micropuncture needle was inserted into the thrombosis popliteal vein and removed over a 018 wire. This was up sized to a glidewire. A 7 French sheath was inserted over the glidewire into the popliteal vein. Right lower extremity venography was performed.  A Kumpe the catheter was advanced over the glidewire to the femoral vein. The catheter was slowly advanced over the wire with intermittent injections for venography, to the level of the IVC. IVC venography was performed.  The thrombus was noted to be somewhat hard preventing easy  passage of the Kumpe the catheter. Angiojet and mechanical thrombectomy was therefore not performed today.  The Kumpe be was exchanged over a stiff glidewire for a 90 cm, 50 cm infusion length 5 French multi side hole catheter.  FINDINGS: Venography of the right lower extremity demonstrates massive DVT extending from the popliteal vein into the common iliac vein.  The final image demonstrates an infusion catheter extending from the right popliteal vein to the IVC. IVC filter is in place.  COMPLICATIONS: None  IMPRESSION: Successful establishment of access for right lower extremity venous lysis. TNK at 0.5 milligrams/hour was instituted.   Electronically Signed   By: Maryclare Bean M.D.   On: 05/04/2013 14:00   Ir Rande Lawman F/u Eval Art/ven Basil Dess Day (ms)  05/05/2013   CLINICAL DATA:  Right lower extremity DVT and status post placement of IVC filter with initiation of transcatheter thrombolytics therapy for ileofemoral DVT yesterday. Fibrinogen has fallen during thrombus therapy to 100 this morning. There has also been persistent oozing around the right jugular central venous catheter.  EXAM: FOLLOWUP ANGIOGRAPHY OF THE RIGHT LOWER EXTREMITY DURING VENOUS FROM THE LIVER THERAPY.  ANESTHESIA/SEDATION: No conscious sedation was administered.  CONTRAST:  30 mL Omnipaque 300  FLUOROSCOPY TIME:  48 seconds.  PROCEDURE: The procedure risks, benefits, and alternatives were explained to the patient. Questions regarding the procedure were encouraged and answered. The patient understands and consents to the procedure.  The pre-existing thrombolytic infusion catheter was injected with contrast material and venography performed of the right lower extremity. Thrombolytic infusion was discontinued and the catheter connected to normal saline at a rate of 50 mL/hour. IV heparin was also held for 2 hr.  The right jugular dialysis catheter was then removed after sterile prep with chlorhexidine. A 2-0 Ethilon pursestring suture was applied  at the catheter exit site. A sterile dressing was applied.  COMPLICATIONS: None  FINDINGS: The right femoral vein in the thigh, common femoral vein and iliac veins show mildly improved patency since the venogram yesterday. There remains a large burden of chronic appearing thrombus. Due to significant decrease in fibrinogen, thrombolytic infusion will be held for a few hours as a new fibrinogen level was checked. If there is a rise in fibrinogen, tenecteplase infusion will be restarted at a lower rate.  IMPRESSION: Follow-up angiography shows mildly improved venous patency during thrombolytic therapy. A significant amount of chronic appearing thrombus remains. Due to drop in fibrinogen, thrombolytic therapy will be held for a few hours and  another fibrinogen level checked.   Electronically Signed   By: Irish Lack M.D.   On: 05/05/2013 13:48    Anti-infectives: Anti-infectives   None      Assessment/Plan: s/p * No surgery found *  RLE venous thrombolysis 3/19-20 DC lysis now Imaging this am proved very little clot resolution despite overnight lytics Fibrinogen low at 109 TED hose now Advance diet Will need to follow up with Dr Fredia Sorrow in 3 months at IR clinic Scheduler will call pt with time and date  Please order compression stockings at discharge   LOS: 2 days    Jacub Waiters A 05/05/2013

## 2013-05-05 NOTE — Progress Notes (Signed)
ANTICOAGULATION CONSULT NOTE -follow up Pharmacy Consult for Heparin  Indication: DVT  No Known Allergies  Patient Measurements: Height: 5\' 4"  (162.6 cm) Weight: 165 lb 5.5 oz (75 kg) IBW/kg (Calculated) : 59.2 Last documented weight ~ 78 kg (12/14/12) Heparin Dosing Weight: 74 kg  Vital Signs: Temp: 98.6 F (37 C) (03/20 2000) Temp src: Oral (03/20 2000) BP: 120/63 mmHg (03/20 2000) Pulse Rate: 77 (03/20 2000)  Labs:  Recent Labs  05/03/13 0950 05/03/13 1928  05/04/13 0449  05/05/13 0054 05/05/13 0500 05/05/13 1255 05/05/13 2119  HGB 14.3  --   --  13.1  < > 12.1* 12.0* 11.4*  --   HCT 42.3  --   --  39.0  < > 36.4* 36.2* 34.4*  --   PLT 208  --   --  195  < > 156 157 139*  --   APTT 29  --   --   --   --   --   --   --   --   LABPROT 13.1 12.9  --   --   --   --   --   --   --   INR 1.01 0.99  --   --   --   --   --   --   --   HEPARINUNFRC  --  0.82*  < >  --   < > <0.10* <0.10* <0.10* <0.10*  CREATININE 1.25  --   --  1.34  --   --   --   --   --   < > = values in this interval not displayed.  Estimated Creatinine Clearance: 68.6 ml/min (by C-G formula based on Cr of 1.34).    Assessment: [age] yo[facility] with right leg swelling and redness.  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  Last warfarin dose was taken on 12/27/12 with therapeutic INR on warfarin dose of 25 mg per week.  Pharmacy consulted to dose IV Heparin.  s/p IVC filter placement and RLE venous lysis with TNKase in IR on 3/19. He had heparin drip and TNKase running until after f/u arteriogram done 3/20 am.   Angiography showed very little lysis of thrombus in thigh and iliac veins and pt had low fibrinogen of 100 and had oozing from right neck cath sit, IV sites and R popliteal site.  TNKase and heparin were stopped in IR by Dr. Fredia SorrowYamagata at ~ 10:20 am.  A repeat fibrinogen at 1300 remained low at 109;  HL was < 0.1  (heparin off) Hg 11.4, pltc low at 139.  MD to pull sheath and resume heparin at full dose per pharmacy one hour post sheath pull.   Prior to IR procedure his HL was 0.55 on 1250 units/hr.  HL was < 0.1 while on TNKase and heparin drip at rates of 800 units/hr and 900 units/hr.   TNKase will not be resumed. Heparin resumed this PM around 1645 at 1200 units/hr therefore heparin level of <0.1 not at steady state. (only 4.5 hrs)   Goal of Therapy:  Heparin level 0.3 - 0.7 Monitor pltc and cbc.   Plan:  - Increase heparin slightly to 1250 units/hr and recheck level  -Daily HL/CBC  Emilio Baylock S. Merilynn Finlandobertson, PharmD, [facility] Clinical Staff Pharmacist Pager [phone]   05/05/2013 10:11 PM

## 2013-05-05 NOTE — Progress Notes (Signed)
Unfortunately, fibrinogen has not risen enough to allow safe continuation of thrombolytic therapy.  Venography shows relatively little clot lysis, likely due to presence of largely chronic thrombus. Will discontinue lytic therapy. Continue IV heparin. Recommend compression stockings for treatment of edema. Follow up will be arranged with Dr. Bonnielee HaffHoss in IR Clinic for possible IVC filter removal and f/u of lower extremities.

## 2013-05-05 NOTE — Sedation Documentation (Signed)
Dr. Fredia Sorrowyamagata ordered ns at 50 to go through the infusion catheter at this time. May restart TNK at later time.  Dressing change and new bag hook up started.

## 2013-05-05 NOTE — Progress Notes (Signed)
ANTICOAGULATION CONSULT NOTE -follow up Pharmacy Consult for Heparin  Indication: DVT  No Known Allergies  Patient Measurements: Height: 5\' 4"  (162.6 cm) Weight: 165 lb 5.5 oz (75 kg) IBW/kg (Calculated) : 59.2 Last documented weight ~ 78 kg (12/14/12) Heparin Dosing Weight: 74 kg  Vital Signs: Temp: 98.2 F (36.8 C) (03/20 0800) Temp src: Oral (03/20 0800) BP: 131/60 mmHg (03/20 1025) Pulse Rate: 65 (03/20 1025)  Labs:  Recent Labs  05/03/13 0950 05/03/13 1928  05/04/13 0449  05/05/13 0054 05/05/13 0500 05/05/13 1255  HGB 14.3  --   --  13.1  < > 12.1* 12.0* 11.4*  HCT 42.3  --   --  39.0  < > 36.4* 36.2* 34.4*  PLT 208  --   --  195  < > 156 157 139*  APTT 29  --   --   --   --   --   --   --   LABPROT 13.1 12.9  --   --   --   --   --   --   INR 1.01 0.99  --   --   --   --   --   --   HEPARINUNFRC  --  0.82*  < >  --   < > <0.10* <0.10* <0.10*  CREATININE 1.25  --   --  1.34  --   --   --   --   < > = values in this interval not displayed.  Estimated Creatinine Clearance: 68.6 ml/min (by C-G formula based on Cr of 1.34).    Assessment: [age] yoM [facility] with right leg swelling and redness.  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  Last warfarin dose was taken on 12/27/12 with therapeutic INR on warfarin dose of 25 mg per week.  Pharmacy consulted to dose IV Heparin.  s/p IVC filter placement and RLE venous lysis with TNKase in IR on 3/19. He had heparin drip and TNKase running until after f/u arteriogram done 3/20 am.   Angiography showed very little lysis of thrombus in thigh and iliac veins and pt had low fibrinogen of 100 and had oozing from right neck cath sit, IV sites and R popliteal site.  TNKase and heparin were stopped in IR by Dr. Fredia SorrowYamagata at ~ 10:20 am.  A repeat fibrinogen at 1300 remained low at 109;  HL was < 0.1 (heparin off) Hg 11.4, pltc low at 139.  MD to pull  sheath and resume heparin at full dose per pharmacy one hour post sheath pull.   Prior to IR procedure his HL was 0.55 on 1250 units/hr.    HL was < 0.1 while on TNKase and heparin drip at rates of 800 units/hr and 900 units/hr.   TNKase will not be resumed.   Goal of Therapy:  Heparin level 0.3 - 0.7 Monitor pltc and cbc.   Plan:  -resume heparin at 1200 units/hr 1 hr after sheath out and check 6 hr HL.  -Daily HL/CBC  Herby AbrahamMichelle T. Josy Peaden, Pharm.D. [phone] 05/05/2013 2:31 PM

## 2013-05-05 NOTE — Progress Notes (Signed)
[facility] PROGRESS NOTE  Tom Dalton ZOX:[id number] DOB: 08-01-1975 DOA: 05/03/2013 PCP: Lorretta Harp, MD  Assessment/Plan: Right leg DVT  - was on  heparin drip>> dc'ed, 3/19 AM and resumed 3/19 PM and pt had direct thrombolysis per IR with Tenecteplase drip till repeat arteriogram is done. Also had IVC filter placed - appreciate IR assistance -continue pain management -Awaiting angiography to follow progress of lysis this am per IR Active Problems:  Leukocytosis  - Likely reactive secondary to extensive DVT No obvious source of infection, UA neg - resolved   Code Status: full Family Communication: none at bedside Disposition Plan: to home when medically ready   Consultants:  IR  Vascular per EDP   Procedures: S/P Rt lower extremity venogram with thrombolysis and   Placement of Inferior Vena Cava filter- Per Dr Bonnielee Haff 3/19   Antibiotics:  None  HPI/Subjective: Still with RLE pain. Denies SOB. Some bleeding from right IJ site earlier  Objective: Filed Vitals:   05/05/13 1005  BP: 103/42  Pulse: 68  Temp:   Resp: 17    Intake/Output Summary (Last 24 hours) at 05/05/13 1009 Last data filed at 05/05/13 0900  Gross per 24 hour  Intake 1749.1 ml  Output    500 ml  Net 1249.1 ml   Filed Weights   05/03/13 1251 05/03/13 2244 05/05/13 0500  Weight: 77.111 kg (170 lb) 73.347 kg (161 lb 11.2 oz) 75 kg (165 lb 5.5 oz)    Exam:  General: alert & oriented x 3 in NAD Cardiovascular: RRR, nl S1 s2; right IJ site with dressing clean and dry Respiratory: CTAB Abdomen: soft +BS NT/ND, no masses palpable Extremities: RLE less tight, decreasing edema.      Data Rewed: Basic Metabolic Panel:  Recent Labs Lab 05/03/13 0950 05/04/13 0449  NA 137 136*  K 4.1 4.1  CL 95* 100  CO2 24 23  GLUCOSE 136* 87  BUN 19 14  CREATININE 1.25 1.34  CALCIUM 10.1 8.9   Liver Function Tests:  Recent Labs Lab 05/04/13 0449  AST 16  ALT 19  ALKPHOS 62  BILITOT  2.0*  PROT 7.5  ALBUMIN 3.5   No results found for this basename: LIPASE, AMYLASE,  in the last 168 hours No results found for this basename: AMMONIA,  in the last 168 hours CBC:  Recent Labs Lab 05/03/13 0950 05/04/13 0449 05/04/13 1410 05/04/13 1815 05/05/13 0054 05/05/13 0500  WBC 11.1* 8.3 8.3 10.0 8.3 8.5  NEUTROABS 8.5*  --   --   --   --   --   HGB 14.3 13.1 13.6 12.8* 12.1* 12.0*  HCT 42.3 39.0 40.4 37.8* 36.4* 36.2*  MCV 71.3* 72.1* 72.3* 71.6* 71.7* 71.7*  PLT 208 195 175 171 156 157   Cardiac Enzymes: No results found for this basename: CKTOTAL, CKMB, CKMBINDEX, TROPONINI,  in the last 168 hours BNP (last 3 results) No results found for this basename: PROBNP,  in the last 8760 hours CBG:  Recent Labs Lab 05/04/13 0801 05/05/13 0807  GLUCAP 83 105*    Recent Results (from the past 240 hour(s))  MRSA PCR SCREENING     Status: None   Collection Time    05/04/13  1:04 PM      Result Value Ref Range Status   MRSA by PCR NEGATIVE  NEGATIVE Final   Comment:            The GeneXpert MRSA Assay (FDA     approved for  NASAL specimens     only), is one component of a     comprehensive MRSA colonization     surveillance program. It is not     intended to diagnose MRSA     infection nor to guide or     monitor treatment for     MRSA infections.     Studies: Ir Veno/ext/uni Right  05/04/2013   CLINICAL DATA:  Right lower extremity DVT  EXAM: IR INFUSION THROMBOL VENOUS INITIAL (MS); IR ULTRASOUND GUIDANCE VASC ACCESS RIGHT; RIGHT EXTREMITY VENOGRAPHY; INFERIOR VENA CAVOGRAM  FLUOROSCOPY TIME:  6 min and 6 seconds.  MEDICATIONS AND MEDICAL HISTORY: Versed 1.5 mg, Fentanyl 75 mcg.  Additional Medications: None.  ANESTHESIA/SEDATION: Moderate sedation time: 25 minutes  CONTRAST:  30 cc Omnipaque 300  PROCEDURE: The procedure, risks, benefits, and alternatives were explained to the patient. Questions regarding the procedure were encouraged and answered. The patient  understands and consents to the procedure.  The right popliteal fossa in the prone position was prepped with Betadine in a sterile fashion, and a sterile drape was applied covering the operative field. A sterile gown and sterile gloves were used for the procedure.  Under sonographic guidance, a micropuncture needle was inserted into the thrombosis popliteal vein and removed over a 018 wire. This was up sized to a glidewire. A 7 French sheath was inserted over the glidewire into the popliteal vein. Right lower extremity venography was performed.  A Kumpe the catheter was advanced over the glidewire to the femoral vein. The catheter was slowly advanced over the wire with intermittent injections for venography, to the level of the IVC. IVC venography was performed.  The thrombus was noted to be somewhat hard preventing easy passage of the Kumpe the catheter. Angiojet and mechanical thrombectomy was therefore not performed today.  The Kumpe be was exchanged over a stiff glidewire for a 90 cm, 50 cm infusion length 5 French multi side hole catheter.  FINDINGS: Venography of the right lower extremity demonstrates massive DVT extending from the popliteal vein into the common iliac vein.  The final image demonstrates an infusion catheter extending from the right popliteal vein to the IVC. IVC filter is in place.  COMPLICATIONS: None  IMPRESSION: Successful establishment of access for right lower extremity venous lysis. TNK at 0.5 milligrams/hour was instituted.   Electronically Signed   By: Maryclare Bean M.D.   On: 05/04/2013 14:00   Ir Venocavagram Ivc  05/04/2013   CLINICAL DATA:  Right lower extremity DVT  EXAM: IR INFUSION THROMBOL VENOUS INITIAL (MS); IR ULTRASOUND GUIDANCE VASC ACCESS RIGHT; RIGHT EXTREMITY VENOGRAPHY; INFERIOR VENA CAVOGRAM  FLUOROSCOPY TIME:  6 min and 6 seconds.  MEDICATIONS AND MEDICAL HISTORY: Versed 1.5 mg, Fentanyl 75 mcg.  Additional Medications: None.  ANESTHESIA/SEDATION: Moderate sedation  time: 25 minutes  CONTRAST:  30 cc Omnipaque 300  PROCEDURE: The procedure, risks, benefits, and alternatives were explained to the patient. Questions regarding the procedure were encouraged and answered. The patient understands and consents to the procedure.  The right popliteal fossa in the prone position was prepped with Betadine in a sterile fashion, and a sterile drape was applied covering the operative field. A sterile gown and sterile gloves were used for the procedure.  Under sonographic guidance, a micropuncture needle was inserted into the thrombosis popliteal vein and removed over a 018 wire. This was up sized to a glidewire. A 7 French sheath was inserted over the glidewire into the popliteal vein. Right lower  extremity venography was performed.  A Kumpe the catheter was advanced over the glidewire to the femoral vein. The catheter was slowly advanced over the wire with intermittent injections for venography, to the level of the IVC. IVC venography was performed.  The thrombus was noted to be somewhat hard preventing easy passage of the Kumpe the catheter. Angiojet and mechanical thrombectomy was therefore not performed today.  The Kumpe be was exchanged over a stiff glidewire for a 90 cm, 50 cm infusion length 5 French multi side hole catheter.  FINDINGS: Venography of the right lower extremity demonstrates massive DVT extending from the popliteal vein into the common iliac vein.  The final image demonstrates an infusion catheter extending from the right popliteal vein to the IVC. IVC filter is in place.  COMPLICATIONS: None  IMPRESSION: Successful establishment of access for right lower extremity venous lysis. TNK at 0.5 milligrams/hour was instituted.   Electronically Signed   By: Maryclare Bean M.D.   On: 05/04/2013 14:00   Ir Ivc Filter Plmt / S&i /img Guid/mod Sed  05/04/2013   CLINICAL DATA:  Right lower extremity DVT  EXAM: IVC FILTER,INFERIOR VENA CAVOGRAM, FLUOROSCOPIC GUIDANCE FOR CENTRAL VENOUS  CATHETER PLACED  FLUOROSCOPY TIME:  1 min and 54 seconds.  MEDICATIONS AND MEDICAL HISTORY: Versed 1.5 mg, Fentanyl 75 mcg.  Additional Medications: None.  ANESTHESIA/SEDATION: Moderate sedation time: 20 minutes  CONTRAST:  40 cc Omnipaque 300  PROCEDURE: The procedure, risks, benefits, and alternatives were explained to the patient. Questions regarding the procedure were encouraged and answered. The patient understands and consents to the procedure.  The right neck was prepped with Betadine in a sterile fashion, and a sterile drape was applied covering the operative field. A sterile gown and sterile gloves were used for the procedure.  The right internal jugular vein was noted to be patent initially with ultrasound. Under sonographic guidance, a micropuncture needle was inserted into the right internal jugular vein (Ultrasound image documentation was performed). It was removed over an 018 wire which was upsized to a [place]. The sheath was inserted over the wire and into the IVC. IVC venography was performed.  The temporary filter was then deployed in the infrarenal IVC.  The sheath was exchanged over a [place] wire for a temporary dialysis catheter.  FINDINGS: IVC venography demonstrates renal vein inflow at L1 and no venous anomaly. No IVC thrombus.  The image demonstrates placement of an IVC filter with its tip at the L1 inferior end-plate.  Final image demonstrates a right internal jugular temporary dialysis catheter with its tip at the cavoatrial junction.  COMPLICATIONS: None  IMPRESSION: Successful infrarenal IVC filter placement. This is a temporary filter. It can be removed or remain in place to become permanent.  A right internal jugular temporary dialysis catheter was exchanged for venous access after IVC filter placement. This will prevent a bleeding complication at the internal jugular access site in preparation for venous lysis.   Electronically Signed   By: Maryclare Bean M.D.   On: 05/04/2013 13:48   Ir  Fluoro Guide Cv Line Right  05/04/2013   CLINICAL DATA:  Right lower extremity DVT  EXAM: IVC FILTER,INFERIOR VENA CAVOGRAM, FLUOROSCOPIC GUIDANCE FOR CENTRAL VENOUS CATHETER PLACED  FLUOROSCOPY TIME:  1 min and 54 seconds.  MEDICATIONS AND MEDICAL HISTORY: Versed 1.5 mg, Fentanyl 75 mcg.  Additional Medications: None.  ANESTHESIA/SEDATION: Moderate sedation time: 20 minutes  CONTRAST:  40 cc Omnipaque 300  PROCEDURE: The procedure, risks, benefits, and alternatives  were explained to the patient. Questions regarding the procedure were encouraged and answered. The patient understands and consents to the procedure.  The right neck was prepped with Betadine in a sterile fashion, and a sterile drape was applied covering the operative field. A sterile gown and sterile gloves were used for the procedure.  The right internal jugular vein was noted to be patent initially with ultrasound. Under sonographic guidance, a micropuncture needle was inserted into the right internal jugular vein (Ultrasound image documentation was performed). It was removed over an 018 wire which was upsized to a [place]. The sheath was inserted over the wire and into the IVC. IVC venography was performed.  The temporary filter was then deployed in the infrarenal IVC.  The sheath was exchanged over a Bentson wire for a temporary dialysis catheter.  FINDINGS: IVC venography demonstrates renal vein inflow at L1 and no venous anomaly. No IVC thrombus.  The image demonstrates placement of an IVC filter with its tip at the L1 inferior end-plate.  Final image demonstrates a right internal jugular temporary dialysis catheter with its tip at the cavoatrial junction.  COMPLICATIONS: None  IMPRESSION: Successful infrarenal IVC filter placement. This is a temporary filter. It can be removed or remain in place to become permanent.  A right internal jugular temporary dialysis catheter was exchanged for venous access after IVC filter placement. This will prevent  a bleeding complication at the internal jugular access site in preparation for venous lysis.   Electronically Signed   By: Maryclare Bean M.D.   On: 05/04/2013 13:48   Ir US Guide Vasc Access Right  05/04/2013   CLINICAL DATA:  Right lower extremity DVT  EXAM: IR INFUSION THROMBOL VENOUS INITIAL (MS); IR ULTRASOUND GUIDANCE VASC ACCESS RIGHT; RIGHT EXTREMITY VENOGRAPHY; INFERIOR VENA CAVOGRAM  FLUOROSCOPY TIME:  6 min and 6 seconds.  MEDICATIONS AND MEDICAL HISTORY: Versed 1.5 mg, Fentanyl 75 mcg.  Additional Medications: None.  ANESTHESIA/SEDATION: Moderate sedation time: 25 minutes  CONTRAST:  30 cc Omnipaque 300  PROCEDURE: The procedure, risks, benefits, and alternatives were explained to the patient. Questions regarding the procedure were encouraged and answered. The patient understands and consents to the procedure.  The right popliteal fossa in the prone position was prepped with Betadine in a sterile fashion, and a sterile drape was applied covering the operative field. A sterile gown and sterile gloves were used for the procedure.  Under sonographic guidance, a micropuncture needle was inserted into the thrombosis popliteal vein and removed over a 018 wire. This was up sized to a glidewire. A 7 French sheath was inserted over the glidewire into the popliteal vein. Right lower extremity venography was performed.  A Kumpe the catheter was advanced over the glidewire to the femoral vein. The catheter was slowly advanced over the wire with intermittent injections for venography, to the level of the IVC. IVC venography was performed.  The thrombus was noted to be somewhat hard preventing easy passage of the Kumpe the catheter. Angiojet and mechanical thrombectomy was therefore not performed today.  The Kumpe be was exchanged over a stiff glidewire for a 90 cm, 50 cm infusion length 5 French multi side hole catheter.  FINDINGS: Venography of the right lower extremity demonstrates massive DVT extending from the  popliteal vein into the common iliac vein.  The final image demonstrates an infusion catheter extending from the right popliteal vein to the IVC. IVC filter is in place.  COMPLICATIONS: None  IMPRESSION: Successful establishment of access for  right lower extremity venous lysis. TNK at 0.5 milligrams/hour was instituted.   Electronically Signed   By: Maryclare BeanArt  Hoss M.D.   On: 05/04/2013 14:00   Ct Angio Abd/pel W/ And/or W/o  05/03/2013   CLINICAL DATA:  Right lower extremity DVT. History of bilateral pulmonary embolism.  EXAM: CT ANGIOGRAPHY ABDOMEN AND PELVIS WITH CONTRAST AND WITHOUT CONTRAST  TECHNIQUE: Multidetector CT imaging of the abdomen and pelvis was performed using the standard protocol during bolus administration of intravenous contrast. Multiplanar reconstructed images and MIPs were obtained and reviewed to evaluate the vascular anatomy.  CONTRAST:  100mL OMNIPAQUE IOHEXOL 300 MG/ML  SOLN  COMPARISON:  CT ANGIO CHEST W/CM &/OR WO/CM dated 12/21/2010  FINDINGS: Beginning in the superior segment of the external iliac vein, thrombus is visualized in the right external iliac vein and right common femoral vein extending into the thigh. The common femoral vein is very distended with clot. Thrombus nearly extends into the right common iliac vein. No thrombus is identified in the IVC.  No mass-effect is identified on venous structures. There is no evidence of adenopathy. No left-sided femoral or iliac DVT is identified. The abdominal aorta is normally patent.  Visualized lung bases show a small right pleural effusion. The liver, gallbladder, pancreas, spleen, adrenal glands and kidneys are unremarkable. There is a simple cyst of the anterior left kidney measuring approximately 2 cm. No abnormal fluid collections are identified. The bladder is unremarkable. No bony abnormalities are seen.  Review of the MIP images confirms the above findings.  IMPRESSION: Right-sided iliofemoral DVT seen throughout the visualized  common femoral and external iliac veins. There is no evidence of IVC thrombus or extrinsic mass effect on venous structures.   Electronically Signed   By: Irish LackGlenn  Yamagata M.D.   On: 05/03/2013 17:07   Ir Infusion Thrombol Venous Initial (ms)  05/04/2013   CLINICAL DATA:  Right lower extremity DVT  EXAM: IR INFUSION THROMBOL VENOUS INITIAL (MS); IR ULTRASOUND GUIDANCE VASC ACCESS RIGHT; RIGHT EXTREMITY VENOGRAPHY; INFERIOR VENA CAVOGRAM  FLUOROSCOPY TIME:  6 min and 6 seconds.  MEDICATIONS AND MEDICAL HISTORY: Versed 1.5 mg, Fentanyl 75 mcg.  Additional Medications: None.  ANESTHESIA/SEDATION: Moderate sedation time: 25 minutes  CONTRAST:  30 cc Omnipaque 300  PROCEDURE: The procedure, risks, benefits, and alternatives were explained to the patient. Questions regarding the procedure were encouraged and answered. The patient understands and consents to the procedure.  The right popliteal fossa in the prone position was prepped with Betadine in a sterile fashion, and a sterile drape was applied covering the operative field. A sterile gown and sterile gloves were used for the procedure.  Under sonographic guidance, a micropuncture needle was inserted into the thrombosis popliteal vein and removed over a 018 wire. This was up sized to a glidewire. A 7 French sheath was inserted over the glidewire into the popliteal vein. Right lower extremity venography was performed.  A Kumpe the catheter was advanced over the glidewire to the femoral vein. The catheter was slowly advanced over the wire with intermittent injections for venography, to the level of the IVC. IVC venography was performed.  The thrombus was noted to be somewhat hard preventing easy passage of the Kumpe the catheter. Angiojet and mechanical thrombectomy was therefore not performed today.  The Kumpe be was exchanged over a stiff glidewire for a 90 cm, 50 cm infusion length 5 French multi side hole catheter.  FINDINGS: Venography of the right lower extremity  demonstrates massive DVT extending from  the popliteal vein into the common iliac vein.  The final image demonstrates an infusion catheter extending from the right popliteal vein to the IVC. IVC filter is in place.  COMPLICATIONS: None  IMPRESSION: Successful establishment of access for right lower extremity venous lysis. TNK at 0.5 milligrams/hour was instituted.   Electronically Signed   By: Maryclare Bean M.D.   On: 05/04/2013 14:00    Scheduled Meds: . fentaNYL      . midazolam      . sodium chloride  3 mL Intravenous Q12H   Continuous Infusions: . sodium chloride 20 mL/hr at 05/04/13 1400  . heparin 1,100 Units/hr (05/05/13 0900)  . tenecteplase (TNKase) infusion 0.5 mg/hr (05/05/13 0932)    Principal Problem:   Right leg DVT Active Problems:   Leukocytosis    Time spent: 35    Tom Dalton  [facility] Pager [phone]. If 7PM-7AM, please contact night-coverage at [email or web address], password [facility] 05/05/2013, 10:09 AM  LOS: 2 days

## 2013-05-06 LAB — CBC
HCT: 32.4 % — ABNORMAL LOW (ref 39.0–52.0)
HEMOGLOBIN: 10.7 g/dL — AB (ref 13.0–17.0)
MCH: 23.7 pg — AB (ref 26.0–34.0)
MCHC: 33 g/dL (ref 30.0–36.0)
MCV: 71.7 fL — ABNORMAL LOW (ref 78.0–100.0)
Platelets: 141 10*3/uL — ABNORMAL LOW (ref 150–400)
RBC: 4.52 MIL/uL (ref 4.22–5.81)
RDW: 14.5 % (ref 11.5–15.5)
WBC: 7.5 10*3/uL (ref 4.0–10.5)

## 2013-05-06 LAB — HEPARIN LEVEL (UNFRACTIONATED)
Heparin Unfractionated: 0.42 IU/mL (ref 0.30–0.70)
Heparin Unfractionated: 0.35 IU/mL (ref 0.30–0.70)

## 2013-05-06 LAB — GLUCOSE, CAPILLARY: Glucose-Capillary: 107 mg/dL — ABNORMAL HIGH (ref 70–99)

## 2013-05-06 NOTE — Progress Notes (Signed)
[facility] PROGRESS NOTE  Kyrell Ruacho ZOX:[id number] DOB: [birth year] DOA: 05/03/2013 PCP: Lorretta Harp, MD  Assessment/Plan: Right leg DVT  - was on  heparin drip>> dc'ed, 3/19 AM and resumed 3/19 PM and pt had direct thrombolysis per IR with Tenecteplase drip till repeat arteriogram is done. Also had IVC filter placed - appreciate IR assistance -discussed with Dr Bonnielee Haff and as per IR not lysis was unsuccessful and stopped today, repeat arteriogram 3/20 only with little clot lysis -Pt is to continue heaprin and he recommended heme consult for further recs>> I consulted and discussed pt with Dr Welton Flakes and she recommends continuing heparin alone for the next 24-48hrs, then to start on coumadin and treat in house till INR is therapeutic(she does not recommend NOACs for this pt). -per hypecoagulable work up>>anticardiolipin abx are low and she agrees that he will need life long anticoagulation on d/c. -Dr Park Breed states hematologist will follow up with pt next week inpatient, and he will also need outpt follow up -continue pain management -per IR pt will need to follow up in 1mos with IR for IVC removal and in 3mos again with IR -thigh high Ted hose recommended and he will need to continue them on d/c as per IR recs Active Problems:  Leukocytosis  - Likely reactive secondary to extensive DVT No obvious source of infection, UA neg - resolved   Code Status: full Family Communication: none at bedside Disposition Plan: to home when medically ready   Consultants:  IR  Vascular per EDP   Procedures: S/P Rt lower extremity venogram with thrombolysis and   Placement of Inferior Vena Cava filter- Per Dr Bonnielee Haff 3/19   Antibiotics:  None  HPI/Subjective: Still with RLE pain. Denies SOB. Denies CP.   Objective: Filed Vitals:   05/06/13 1500  BP: 140/63  Pulse: 75  Temp:   Resp: 15    Intake/Output Summary (Last 24 hours) at 05/06/13 1645 Last data filed at 05/06/13 1500  Gross per  24 hour  Intake 494.71 ml  Output    400 ml  Net  94.71 ml   Filed Weights   05/03/13 2244 05/05/13 0500 05/06/13 0415  Weight: 73.347 kg (161 lb 11.2 oz) 75 kg (165 lb 5.5 oz) 76.1 kg (167 lb 12.3 oz)    Exam:  General: alert & oriented x 3 in NAD Cardiovascular: RRR, nl S1 s2; right IJ site with dressing clean and dry Respiratory: CTAB Abdomen: soft +BS NT/ND, no masses palpable Extremities: RLE less tight, edema about the same, TED hose in place.      Data Rewed: Basic Metabolic Panel:  Recent Labs Lab 05/03/13 0950 05/04/13 0449  NA 137 136*  K 4.1 4.1  CL 95* 100  CO2 24 23  GLUCOSE 136* 87  BUN 19 14  CREATININE 1.25 1.34  CALCIUM 10.1 8.9   Liver Function Tests:  Recent Labs Lab 05/04/13 0449  AST 16  ALT 19  ALKPHOS 62  BILITOT 2.0*  PROT 7.5  ALBUMIN 3.5   No results found for this basename: LIPASE, AMYLASE,  in the last 168 hours No results found for this basename: AMMONIA,  in the last 168 hours CBC:  Recent Labs Lab 05/03/13 0950  05/04/13 1815 05/05/13 0054 05/05/13 0500 05/05/13 1255 05/06/13 0644  WBC 11.1*  < > 10.0 8.3 8.5 7.7 7.5  NEUTROABS 8.5*  --   --   --   --   --   --   HGB 14.3  < >  12.8* 12.1* 12.0* 11.4* 10.7*  HCT 42.3  < > 37.8* 36.4* 36.2* 34.4* 32.4*  MCV 71.3*  < > 71.6* 71.7* 71.7* 71.7* 71.7*  PLT 208  < > 171 156 157 139* 141*  < > = values in this interval not displayed. Cardiac Enzymes: No results found for this basename: CKTOTAL, CKMB, CKMBINDEX, TROPONINI,  in the last 168 hours BNP (last 3 results) No results found for this basename: PROBNP,  in the last 8760 hours CBG:  Recent Labs Lab 05/04/13 0801 05/05/13 0807 05/06/13 0745  GLUCAP 83 105* 107*    Recent Results (from the past 240 hour(s))  MRSA PCR SCREENING     Status: None   Collection Time    05/04/13  1:04 PM      Result Value Ref Range Status   MRSA by PCR NEGATIVE  NEGATIVE Final   Comment:            The GeneXpert MRSA Assay (FDA      approved for NASAL specimens     only), is one component of a     comprehensive MRSA colonization     surveillance program. It is not     intended to diagnose MRSA     infection nor to guide or     monitor treatment for     MRSA infections.     Studies: Ir Rande Lawman F/u Eval Art/ven Subseq Day (ms)  05/05/2013   CLINICAL DATA:  Right lower extremity DVT and status post placement of IVC filter with initiation of transcatheter thrombolytics therapy for ileofemoral DVT yesterday. Fibrinogen has fallen during thrombus therapy to 100 this morning. There has also been persistent oozing around the right jugular central venous catheter.  EXAM: FOLLOWUP ANGIOGRAPHY OF THE RIGHT LOWER EXTREMITY DURING VENOUS FROM THE LIVER THERAPY.  ANESTHESIA/SEDATION: No conscious sedation was administered.  CONTRAST:  30 mL Omnipaque 300  FLUOROSCOPY TIME:  48 seconds.  PROCEDURE: The procedure risks, benefits, and alternatives were explained to the patient. Questions regarding the procedure were encouraged and answered. The patient understands and consents to the procedure.  The pre-existing thrombolytic infusion catheter was injected with contrast material and venography performed of the right lower extremity. Thrombolytic infusion was discontinued and the catheter connected to normal saline at a rate of 50 mL/hour. IV heparin was also held for 2 hr.  The right jugular dialysis catheter was then removed after sterile prep with chlorhexidine. A 2-0 Ethilon pursestring suture was applied at the catheter exit site. A sterile dressing was applied.  COMPLICATIONS: None  FINDINGS: The right femoral vein in the thigh, common femoral vein and iliac veins show mildly improved patency since the venogram yesterday. There remains a large burden of chronic appearing thrombus. Due to significant decrease in fibrinogen, thrombolytic infusion will be held for a few hours as a new fibrinogen level was checked. If there is a rise in  fibrinogen, tenecteplase infusion will be restarted at a lower rate.  IMPRESSION: Follow-up angiography shows mildly improved venous patency during thrombolytic therapy. A significant amount of chronic appearing thrombus remains. Due to drop in fibrinogen, thrombolytic therapy will be held for a few hours and another fibrinogen level checked.   Electronically Signed   By: Irish Lack M.D.   On: 05/05/2013 13:48    Scheduled Meds: . sodium chloride  3 mL Intravenous Q12H   Continuous Infusions: . sodium chloride 10 mL/hr at 05/05/13 2217  . heparin 1,250 Units/hr (05/06/13 1450)  Principal Problem:   Right leg DVT Active Problems:   Leukocytosis    Time spent: 35    [facility] Rehabilitation At Northwest DallasVIYUOH,Demerius Podolak C  [facility] Pager [phone]. If 7PM-7AM, please contact night-coverage at [email or web address], password [email or web address] 05/06/2013, 4:45 PM  LOS: 3 days

## 2013-05-06 NOTE — Progress Notes (Signed)
ANTICOAGULATION CONSULT NOTE - Follow Up Consult  Pharmacy Consult for Heparin  Indication: DVT  No Known Allergies  Patient Measurements: Height: 5\' 4"  (162.6 cm) Weight: 167 lb 12.3 oz (76.1 kg) IBW/kg (Calculated) : 59.2  Vital Signs: Temp: 98.8 F (37.1 C) (03/21 0400) Temp src: Oral (03/21 0400) BP: 113/51 mmHg (03/21 0700) Pulse Rate: 55 (03/21 0700)  Labs:  Recent Labs  05/03/13 0950 05/03/13 1928  05/04/13 0449  05/05/13 0500 05/05/13 1255 05/05/13 2119 05/06/13 0644 05/06/13 0650  HGB 14.3  --   --  13.1  < > 12.0* 11.4*  --  10.7*  --   HCT 42.3  --   --  39.0  < > 36.2* 34.4*  --  32.4*  --   PLT 208  --   --  195  < > 157 139*  --  141*  --   APTT 29  --   --   --   --   --   --   --   --   --   LABPROT 13.1 12.9  --   --   --   --   --   --   --   --   INR 1.01 0.99  --   --   --   --   --   --   --   --   HEPARINUNFRC  --  0.82*  < >  --   < > <0.10* <0.10* <0.10*  --  0.42  CREATININE 1.25  --   --  1.34  --   --   --   --   --   --   < > = values in this interval not displayed.  Estimated Creatinine Clearance: 69.1 ml/min (by C-G formula based on Cr of 1.34).   Medications:  Heparin 1250 units/hr  Assessment: 38 y/o M on heparin for DVT, pt now has IVC, pt is now s/p lytic therapy with little clot lysis, HL is 0.42 after slight rate increase.   Goal of Therapy:  Heparin level 0.3-0.7 units/ml Monitor platelets by anticoagulation protocol: Yes   Plan:  -Continue heparin at 1250 units/hr -1500 HL to confirm -Daily CBC/HL -Monitor for bleeding  Abran DukeLedford, Troyce Febo 05/06/2013,7:51 AM

## 2013-05-06 NOTE — Progress Notes (Signed)
ANTICOAGULATION CONSULT NOTE - Follow Up Consult  Pharmacy Consult for Heparin Indication: DVT  No Known Allergies  Patient Measurements: Height: 5\' 4"  (162.6 cm) Weight: 167 lb 12.3 oz (76.1 kg) IBW/kg (Calculated) : 59.2 Heparin Dosing Weight:   Vital Signs: Temp: 98.5 F (36.9 C) (03/21 1200) Temp src: Oral (03/21 1200) BP: 122/65 mmHg (03/21 1700) Pulse Rate: 76 (03/21 1700)  Labs:  Recent Labs  05/03/13 1928  05/04/13 0449  05/05/13 0500 05/05/13 1255 05/05/13 2119 05/06/13 0644 05/06/13 0650 05/06/13 1605  HGB  --   --  13.1  < > 12.0* 11.4*  --  10.7*  --   --   HCT  --   --  39.0  < > 36.2* 34.4*  --  32.4*  --   --   PLT  --   --  195  < > 157 139*  --  141*  --   --   LABPROT 12.9  --   --   --   --   --   --   --   --   --   INR 0.99  --   --   --   --   --   --   --   --   --   HEPARINUNFRC 0.82*  < >  --   < > <0.10* <0.10* <0.10*  --  0.42 0.35  CREATININE  --   --  1.34  --   --   --   --   --   --   --   < > = values in this interval not displayed.  Estimated Creatinine Clearance: 69.1 ml/min (by C-G formula based on Cr of 1.34).   Medications:  Scheduled:  . sodium chloride  3 mL Intravenous Q12H    Assessment: 38yo male on Heparin for DVT, s/p TNKase with no plans to resume.  Heparin level 0.35 on 1250 units/hr, Hg 10.7 and Pltc 149- stable.  No bleeding problems noted.  Goal of Therapy:  Heparin level 0.3-0.7 units/ml Monitor platelets by anticoagulation protocol: Yes   Plan:  1-  Inc heparin to 1300 units/hr 2-  F/U in AM  Marisue HumbleKendra Rupal Childress, PharmD Clinical Pharmacist [facility]

## 2013-05-07 DIAGNOSIS — Z86711 Personal history of pulmonary embolism: Secondary | ICD-10-CM

## 2013-05-07 LAB — GLUCOSE, CAPILLARY: GLUCOSE-CAPILLARY: 96 mg/dL (ref 70–99)

## 2013-05-07 LAB — CBC
HEMATOCRIT: 30.2 % — AB (ref 39.0–52.0)
HEMOGLOBIN: 10.1 g/dL — AB (ref 13.0–17.0)
MCH: 23.8 pg — ABNORMAL LOW (ref 26.0–34.0)
MCHC: 33.4 g/dL (ref 30.0–36.0)
MCV: 71.2 fL — AB (ref 78.0–100.0)
Platelets: 168 10*3/uL (ref 150–400)
RBC: 4.24 MIL/uL (ref 4.22–5.81)
RDW: 14.6 % (ref 11.5–15.5)
WBC: 5.5 10*3/uL (ref 4.0–10.5)

## 2013-05-07 LAB — HEPARIN LEVEL (UNFRACTIONATED): Heparin Unfractionated: 0.46 IU/mL (ref 0.30–0.70)

## 2013-05-07 MED ORDER — WARFARIN - PHARMACIST DOSING INPATIENT
Freq: Every day | Status: DC
  Administered 2013-05-09: 18:00:00

## 2013-05-07 MED ORDER — COUMADIN BOOK
1.0000 | Freq: Once | Status: AC
  Administered 2013-05-07: 1
  Filled 2013-05-07: qty 1

## 2013-05-07 MED ORDER — DIPHENHYDRAMINE HCL 25 MG PO CAPS
25.0000 mg | ORAL_CAPSULE | Freq: Three times a day (TID) | ORAL | Status: DC | PRN
  Administered 2013-05-07: 25 mg via ORAL
  Filled 2013-05-07: qty 1

## 2013-05-07 MED ORDER — WARFARIN VIDEO: 1.0000 | Freq: Once | Status: DC

## 2013-05-07 MED ORDER — WARFARIN SODIUM 10 MG PO TABS
10.0000 mg | ORAL_TABLET | Freq: Once | ORAL | Status: AC
  Administered 2013-05-07: 10 mg via ORAL
  Filled 2013-05-07: qty 1

## 2013-05-07 NOTE — Progress Notes (Signed)
ANTICOAGULATION CONSULT NOTE - Follow Up Consult  Pharmacy Consult for Heparin Indication: DVT  No Known Allergies  Patient Measurements: Height: 5\' 4"  (162.6 cm) Weight: 169 lb 12.1 oz (77 kg) IBW/kg (Calculated) : 59.2 Heparin Dosing Weight:   Vital Signs: Temp: 97.7 F (36.5 C) (03/22 0750) Temp src: Oral (03/22 0750) BP: 99/49 mmHg (03/22 0750) Pulse Rate: 71 (03/22 0750)  Labs:  Recent Labs  05/05/13 1255  05/06/13 0644 05/06/13 0650 05/06/13 1605 05/07/13 0328  HGB 11.4*  --  10.7*  --   --  10.1*  HCT 34.4*  --  32.4*  --   --  30.2*  PLT 139*  --  141*  --   --  168  HEPARINUNFRC <0.10*  < >  --  0.42 0.35 0.46  < > = values in this interval not displayed.  Estimated Creatinine Clearance: 69.4 ml/min (by C-G formula based on Cr of 1.34).   Medications:  Scheduled:  . sodium chloride  3 mL Intravenous Q12H    Assessment: 38yo male on Heparin for DVT, s/p TNKase with no plans to resume.  Last heparin level 0.35 on 1250 units/hr.  This AM HL is 0.46 after a slight increase in the heparin dose.  Hg 10.1 and Pltc 168- stable.  No bleeding problems noted.    Goal of Therapy:  Heparin level 0.3-0.7 units/ml Monitor platelets by anticoagulation protocol: Yes   Plan:  1-  Continue heparin to 1300 units/hr 2-  F/U daily HL/cbc  Anabel BeneSendra Namya Voges, PharmD Clinical Pharmacist Pager: [phone]

## 2013-05-07 NOTE — Progress Notes (Signed)
ANTICOAGULATION CONSULT NOTE - Initial Consult  Pharmacy Consult for Coumadin Indication: DVT  No Known Allergies  Patient Measurements: Height: 5\' 4"  (162.6 cm) Weight: 169 lb 12.1 oz (77 kg) IBW/kg (Calculated) : 59.2 Heparin Dosing Weight:   Vital Signs: Temp: 98.5 F (36.9 C) (03/22 1555) Temp src: Oral (03/22 1555) BP: 134/64 mmHg (03/22 1555) Pulse Rate: 76 (03/22 1555)  Labs:  Recent Labs  05/05/13 1255  05/06/13 0644 05/06/13 0650 05/06/13 1605 05/07/13 0328  HGB 11.4*  --  10.7*  --   --  10.1*  HCT 34.4*  --  32.4*  --   --  30.2*  PLT 139*  --  141*  --   --  168  HEPARINUNFRC <0.10*  < >  --  0.42 0.35 0.46  < > = values in this interval not displayed.  Estimated Creatinine Clearance: 69.4 ml/min (by C-G formula based on Cr of 1.34).   Medical History: Past Medical History  Diagnosis Date  . Tobacco abuse   . ETOH abuse   . Headache(784.0)     when the weather changes, advil  . Pneumonia     5 years ago  . Acute renal failure 12/24/2010  . Blood clot in vein   . Right leg DVT 05/03/2013    Medications:  Scheduled:  . coumadin book  1 each Does not apply Once  . sodium chloride  3 mL Intravenous Q12H  . warfarin  10 mg Oral ONCE-1800  . [START ON 05/08/2013] warfarin  1 each Does not apply Once  . Warfarin - Pharmacist Dosing Inpatient   Does not apply q1800    Assessment: 38yo male with DVT, currently on therapeutic heparin and to add Coumadin.  INR on admission 0.99.  No bleeding problems noted.  Goal of Therapy:  INR 2-3 Monitor platelets by anticoagulation protocol: Yes   Plan:  1-  Coumadin 10mg  2-  Daily protime 3-  Coumadin book and video  Marisue HumbleKendra Jolanda Mccann, PharmD Clinical Pharmacist [facility]

## 2013-05-07 NOTE — Consult Note (Signed)
[facility]  Telephone:([phone]) [phone] [phone]     INITIAL HEMATOLOGY CONSULTATION    Referral MD:  Dr. 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 At that time he was on anticoagulation and after one year this was discontinued. Over a one-month period patient experienced pain and swelling in the right lower extremity. He continued to worsen. Prior to admission patient is working out at [employer] and he had his leg until he felt a shooting pain from the foot to the groin area and he felt something was moving inside the leg. It started to swell he therefore presented to the emergency room. He was noted to be redness and pain in the right lower extremity. Indeed the patient had lower extremity Doppler study performed he was found to have extensive acute DVT of the right lower extremity involving the posterior tibial, perineal, popliteal, profunda, femoral, and common femoral veins. Also noted was superficial thrombus involving the greater saphenous vein mid to proximal and into the saphenofemoral junction. There was no propagation to the left side. Vascular surgery was consulted and recommendation was interventional radiology consult for tract thrombo-lysis. This was performed by was not successful. He was placed on heparin. Patient also had hypercoagulable panel performed which revealed only a little protein C. total and low protein c. activity. Lupus anticoagulant was not detected beta-2 glycoprotein's were normal there was no factor V Leiden gene mutation. Antithrombin III activity was normal. Anticardiolipin antibodies were all low IgG 8, IgM 8, IgA and 9.    Past Medical History  Diagnosis Date  . Tobacco abuse   . ETOH abuse   . Headache(784.0)     when the weather changes, advil  . Pneumonia     5  years ago  . Acute renal failure 12/24/2010  . Blood clot in vein   . Right leg DVT 05/03/2013  :    History reviewed. No pertinent past surgical history.:   CURRENT MEDS: Current Facility-Administered Medications  Medication Dose Route Frequency Provider Last Rate Last Dose  . 0.9 %  sodium chloride infusion   Intravenous Continuous Kela Millin, MD 10 mL/hr at 05/07/13 1800    . acetaminophen (TYLENOL) tablet 650 mg  650 mg Oral Q6H PRN Alison Murray, MD       Or  . acetaminophen (TYLENOL) suppository 650 mg  650 mg Rectal Q6H PRN Alison Murray, MD      . diphenhydrAMINE (BENADRYL) capsule 25 mg  25 mg Oral Q8H PRN Kela Millin, MD   25 mg at 05/07/13 1716  . heparin ADULT infusion 100 units/mL (25000 units/250 mL)  1,300 Units/hr Intravenous Continuous Renaee Munda, RPH 13 mL/hr at 05/07/13 1800 1,300 Units/hr at 05/07/13 1800  . HYDROcodone-acetaminophen (NORCO/VICODIN) 5-325 MG per tablet 1-2 tablet  1-2 tablet Oral Q4H PRN Alison Murray, MD   2 tablet at 05/06/13 1449  . morphine 2 MG/ML injection 1 mg  1 mg Intravenous Q3H PRN Alison Murray, MD   1 mg at 05/05/13 0532  . ondansetron (ZOFRAN) tablet 4 mg  4 mg Oral Q6H PRN Alison Murray, MD       Or  . ondansetron [facility]) injection 4 mg  4 mg Intravenous Q6H PRN Alison Murray, MD      . sodium  chloride 0.9 % injection 3 mL  3 mL Intravenous Q12H Alison Murray, MD   3 mL at 05/05/13 2200  . [START ON 05/08/2013] warfarin (COUMADIN) video 1 each  1 each Does not apply Once Renaee Munda, RPH      . Warfarin - Pharmacist Dosing Inpatient   Does not apply q1800 Kendra P Hiatt, RPH          No Known Allergies:  Family History  Problem Relation Age of Onset  . Stroke Father     no premature CAD  . Healthy Mother     no premature CAD  :  History   Social History  . Marital Status: Single    Spouse Name: N/A    Number of Children: 3  . Years of Education: N/A   Occupational History  . Fingernail engineer     Social History Main Topics  . Smoking status: Former Smoker -- 0.10 packs/day for 10 years  . Smokeless tobacco: Never Used  . Alcohol Use: 9.0 oz/week    15 Cans of beer per week     Comment: Drinks 8-10 beers on Saturday and Sundays  . Drug Use: No     Comment: Denies  . Sexual Activity: No   Other Topics Concern  . Not on file   Social History Narrative  . No narrative on file  :  REVIEW OF SYSTEM:  The rest of the 14-point review of sytem was negative.   Exam: Patient Vitals for the past 24 hrs:  BP Temp Temp src Pulse Resp SpO2 Weight  05/07/13 1555 134/64 mmHg 98.5 F (36.9 C) Oral 76 20 98 % -  05/07/13 1300 109/54 mmHg - - - 23 - -  05/07/13 1219 100/53 mmHg - - - 31 - -  05/07/13 1120 106/51 mmHg 98.4 F (36.9 C) Oral - 27 97 % -  05/07/13 0750 99/49 mmHg 97.7 F (36.5 C) Oral 71 20 98 % -  05/07/13 0700 92/50 mmHg - - 52 13 98 % -  05/07/13 0600 99/44 mmHg - - 50 12 98 % -  05/07/13 0500 92/57 mmHg - - 50 16 99 % 169 lb 12.1 oz (77 kg)  05/07/13 0400 102/44 mmHg 97.6 F (36.4 C) Oral 55 18 98 % -  05/07/13 0300 106/47 mmHg - - 66 19 97 % -  05/07/13 0200 94/52 mmHg - - 55 17 98 % -  05/07/13 0100 91/53 mmHg - - 54 12 98 % -  05/07/13 0000 100/58 mmHg - - 53 16 99 % -  05/06/13 2300 93/50 mmHg - - 50 13 98 % -  05/06/13 2200 108/57 mmHg - - 57 21 97 % -  05/06/13 2100 121/62 mmHg - - 63 19 98 % -    General:  well-nourished in no acute distress.  Eyes:  no scleral icterus.  ENT:  There were no oropharyngeal lesions.  Neck was without thyromegaly.  Lymphatics:  Negative cervical, supraclavicular or axillary adenopathy.  Respiratory: lungs were clear bilaterally without wheezing or crackles.  Cardiovascular:  Regular rate and rhythm, S1/S2, without murmur, rub or gallop. Right lower extremity edema, minimal tenderness..  GI:  abdomen was soft, flat, nontender, nondistended, without organomegaly.  Muscoloskeletal:  no spinal tenderness of palpation of vertebral  spine.  Skin exam was without echymosis, petichae.  Neuro exam was nonfocal.  Patient was able to get on and off exam table without assistance.  Gait was normal.  Patient was alerted and oriented.  Attention was good.   Language was appropriate.  Mood was normal without depression.  Speech was not pressured.  Thought content was not tangential.    LABS:  Lab Results  Component Value Date   WBC 5.5 05/07/2013   HGB 10.1* 05/07/2013   HCT 30.2* 05/07/2013   PLT 168 05/07/2013   GLUCOSE 87 05/04/2013   ALT 19 05/04/2013   AST 16 05/04/2013   NA 136* 05/04/2013   K 4.1 05/04/2013   CL 100 05/04/2013   CREATININE 1.34 05/04/2013   BUN 14 05/04/2013   CO2 23 05/04/2013   INR 0.99 05/03/2013    Ir Veno/ext/uni Right  05/04/2013   CLINICAL DATA:  Right lower extremity DVT  EXAM: IR INFUSION THROMBOL VENOUS INITIAL (MS); IR ULTRASOUND GUIDANCE VASC ACCESS RIGHT; RIGHT EXTREMITY VENOGRAPHY; INFERIOR VENA CAVOGRAM  FLUOROSCOPY TIME:  6 min and 6 seconds.  MEDICATIONS AND MEDICAL HISTORY: Versed 1.5 mg, Fentanyl 75 mcg.  Additional Medications: None.  ANESTHESIA/SEDATION: Moderate sedation time: 25 minutes  CONTRAST:  30 cc Omnipaque 300  PROCEDURE: The procedure, risks, benefits, and alternatives were explained to the patient. Questions regarding the procedure were encouraged and answered. The patient understands and consents to the procedure.  The right popliteal fossa in the prone position was prepped with Betadine in a sterile fashion, and a sterile drape was applied covering the operative field. A sterile gown and sterile gloves were used for the procedure.  Under sonographic guidance, a micropuncture needle was inserted into the thrombosis popliteal vein and removed over a 018 wire. This was up sized to a glidewire. A 7 French sheath was inserted over the glidewire into the popliteal vein. Right lower extremity venography was performed.  A Kumpe the catheter was advanced over the glidewire to the femoral vein.  The catheter was slowly advanced over the wire with intermittent injections for venography, to the level of the IVC. IVC venography was performed.  The thrombus was noted to be somewhat hard preventing easy passage of the Kumpe the catheter. Angiojet and mechanical thrombectomy was therefore not performed today.  The Kumpe be was exchanged over a stiff glidewire for a 90 cm, 50 cm infusion length 5 French multi side hole catheter.  FINDINGS: Venography of the right lower extremity demonstrates massive DVT extending from the popliteal vein into the common iliac vein.  The final image demonstrates an infusion catheter extending from the right popliteal vein to the IVC. IVC filter is in place.  COMPLICATIONS: None  IMPRESSION: Successful establishment of access for right lower extremity venous lysis. TNK at 0.5 milligrams/hour was instituted.   Electronically Signed   By: Maryclare Bean M.D.   On: 05/04/2013 14:00   Ir Venocavagram Ivc  05/04/2013   CLINICAL DATA:  Right lower extremity DVT  EXAM: IR INFUSION THROMBOL VENOUS INITIAL (MS); IR ULTRASOUND GUIDANCE VASC ACCESS RIGHT; RIGHT EXTREMITY VENOGRAPHY; INFERIOR VENA CAVOGRAM  FLUOROSCOPY TIME:  6 min and 6 seconds.  MEDICATIONS AND MEDICAL HISTORY: Versed 1.5 mg, Fentanyl 75 mcg.  Additional Medications: None.  ANESTHESIA/SEDATION: Moderate sedation time: 25 minutes  CONTRAST:  30 cc Omnipaque 300  PROCEDURE: The procedure, risks, benefits, and alternatives were explained to the patient. Questions regarding the procedure were encouraged and answered. The patient understands and consents to the procedure.  The right popliteal fossa in the prone position was prepped with Betadine in a sterile fashion, and a sterile drape was applied covering the operative field. A sterile  gown and sterile gloves were used for the procedure.  Under sonographic guidance, a micropuncture needle was inserted into the thrombosis popliteal vein and removed over a 018 wire. This was up sized  to a glidewire. A 7 French sheath was inserted over the glidewire into the popliteal vein. Right lower extremity venography was performed.  A Kumpe the catheter was advanced over the glidewire to the femoral vein. The catheter was slowly advanced over the wire with intermittent injections for venography, to the level of the IVC. IVC venography was performed.  The thrombus was noted to be somewhat hard preventing easy passage of the Kumpe the catheter. Angiojet and mechanical thrombectomy was therefore not performed today.  The Kumpe be was exchanged over a stiff glidewire for a 90 cm, 50 cm infusion length 5 French multi side hole catheter.  FINDINGS: Venography of the right lower extremity demonstrates massive DVT extending from the popliteal vein into the common iliac vein.  The final image demonstrates an infusion catheter extending from the right popliteal vein to the IVC. IVC filter is in place.  COMPLICATIONS: None  IMPRESSION: Successful establishment of access for right lower extremity venous lysis. TNK at 0.5 milligrams/hour was instituted.   Electronically Signed   By: Maryclare Bean M.D.   On: 05/04/2013 14:00   Ir Ivc Filter Plmt / S&i /img Guid/mod Sed  05/04/2013   CLINICAL DATA:  Right lower extremity DVT  EXAM: IVC FILTER,INFERIOR VENA CAVOGRAM, FLUOROSCOPIC GUIDANCE FOR CENTRAL VENOUS CATHETER PLACED  FLUOROSCOPY TIME:  1 min and 54 seconds.  MEDICATIONS AND MEDICAL HISTORY: Versed 1.5 mg, Fentanyl 75 mcg.  Additional Medications: None.  ANESTHESIA/SEDATION: Moderate sedation time: 20 minutes  CONTRAST:  40 cc Omnipaque 300  PROCEDURE: The procedure, risks, benefits, and alternatives were explained to the patient. Questions regarding the procedure were encouraged and answered. The patient understands and consents to the procedure.  The right neck was prepped with Betadine in a sterile fashion, and a sterile drape was applied covering the operative field. A sterile gown and sterile gloves were used for  the procedure.  The right internal jugular vein was noted to be patent initially with ultrasound. Under sonographic guidance, a micropuncture needle was inserted into the right internal jugular vein (Ultrasound image documentation was performed). It was removed over an 018 wire which was upsized to a [place]. The sheath was inserted over the wire and into the IVC. IVC venography was performed.  The temporary filter was then deployed in the infrarenal IVC.  The sheath was exchanged over a Bentson wire for a temporary dialysis catheter.  FINDINGS: IVC venography demonstrates renal vein inflow at L1 and no venous anomaly. No IVC thrombus.  The image demonstrates placement of an IVC filter with its tip at the L1 inferior end-plate.  Final image demonstrates a right internal jugular temporary dialysis catheter with its tip at the cavoatrial junction.  COMPLICATIONS: None  IMPRESSION: Successful infrarenal IVC filter placement. This is a temporary filter. It can be removed or remain in place to become permanent.  A right internal jugular temporary dialysis catheter was exchanged for venous access after IVC filter placement. This will prevent a bleeding complication at the internal jugular access site in preparation for venous lysis.   Electronically Signed   By: Maryclare Bean M.D.   On: 05/04/2013 13:48   Ir Fluoro Guide Cv Line Right  05/04/2013   CLINICAL DATA:  Right lower extremity DVT  EXAM: IVC FILTER,INFERIOR VENA CAVOGRAM, FLUOROSCOPIC GUIDANCE  FOR CENTRAL VENOUS CATHETER PLACED  FLUOROSCOPY TIME:  1 min and 54 seconds.  MEDICATIONS AND MEDICAL HISTORY: Versed 1.5 mg, Fentanyl 75 mcg.  Additional Medications: None.  ANESTHESIA/SEDATION: Moderate sedation time: 20 minutes  CONTRAST:  40 cc Omnipaque 300  PROCEDURE: The procedure, risks, benefits, and alternatives were explained to the patient. Questions regarding the procedure were encouraged and answered. The patient understands and consents to the procedure.  The  right neck was prepped with Betadine in a sterile fashion, and a sterile drape was applied covering the operative field. A sterile gown and sterile gloves were used for the procedure.  The right internal jugular vein was noted to be patent initially with ultrasound. Under sonographic guidance, a micropuncture needle was inserted into the right internal jugular vein (Ultrasound image documentation was performed). It was removed over an 018 wire which was upsized to a [place]. The sheath was inserted over the wire and into the IVC. IVC venography was performed.  The temporary filter was then deployed in the infrarenal IVC.  The sheath was exchanged over a Bentson wire for a temporary dialysis catheter.  FINDINGS: IVC venography demonstrates renal vein inflow at L1 and no venous anomaly. No IVC thrombus.  The image demonstrates placement of an IVC filter with its tip at the L1 inferior end-plate.  Final image demonstrates a right internal jugular temporary dialysis catheter with its tip at the cavoatrial junction.  COMPLICATIONS: None  IMPRESSION: Successful infrarenal IVC filter placement. This is a temporary filter. It can be removed or remain in place to become permanent.  A right internal jugular temporary dialysis catheter was exchanged for venous access after IVC filter placement. This will prevent a bleeding complication at the internal jugular access site in preparation for venous lysis.   Electronically Signed   By: Maryclare Bean M.D.   On: 05/04/2013 13:48   Ir US Guide Vasc Access Right  05/04/2013   CLINICAL DATA:  Right lower extremity DVT  EXAM: IR INFUSION THROMBOL VENOUS INITIAL (MS); IR ULTRASOUND GUIDANCE VASC ACCESS RIGHT; RIGHT EXTREMITY VENOGRAPHY; INFERIOR VENA CAVOGRAM  FLUOROSCOPY TIME:  6 min and 6 seconds.  MEDICATIONS AND MEDICAL HISTORY: Versed 1.5 mg, Fentanyl 75 mcg.  Additional Medications: None.  ANESTHESIA/SEDATION: Moderate sedation time: 25 minutes  CONTRAST:  30 cc Omnipaque 300   PROCEDURE: The procedure, risks, benefits, and alternatives were explained to the patient. Questions regarding the procedure were encouraged and answered. The patient understands and consents to the procedure.  The right popliteal fossa in the prone position was prepped with Betadine in a sterile fashion, and a sterile drape was applied covering the operative field. A sterile gown and sterile gloves were used for the procedure.  Under sonographic guidance, a micropuncture needle was inserted into the thrombosis popliteal vein and removed over a 018 wire. This was up sized to a glidewire. A 7 French sheath was inserted over the glidewire into the popliteal vein. Right lower extremity venography was performed.  A Kumpe the catheter was advanced over the glidewire to the femoral vein. The catheter was slowly advanced over the wire with intermittent injections for venography, to the level of the IVC. IVC venography was performed.  The thrombus was noted to be somewhat hard preventing easy passage of the Kumpe the catheter. Angiojet and mechanical thrombectomy was therefore not performed today.  The Kumpe be was exchanged over a stiff glidewire for a 90 cm, 50 cm infusion length 5 French multi side hole catheter.  FINDINGS:  Venography of the right lower extremity demonstrates massive DVT extending from the popliteal vein into the common iliac vein.  The final image demonstrates an infusion catheter extending from the right popliteal vein to the IVC. IVC filter is in place.  COMPLICATIONS: None  IMPRESSION: Successful establishment of access for right lower extremity venous lysis. TNK at 0.5 milligrams/hour was instituted.   Electronically Signed   By: Maryclare Bean M.D.   On: 05/04/2013 14:00   Ct Angio Abd/pel W/ And/or W/o  05/03/2013   CLINICAL DATA:  Right lower extremity DVT. History of bilateral pulmonary embolism.  EXAM: CT ANGIOGRAPHY ABDOMEN AND PELVIS WITH CONTRAST AND WITHOUT CONTRAST  TECHNIQUE: Multidetector  CT imaging of the abdomen and pelvis was performed using the standard protocol during bolus administration of intravenous contrast. Multiplanar reconstructed images and MIPs were obtained and reviewed to evaluate the vascular anatomy.  CONTRAST:  OMNIPAQUE IOHEXOL 300 MG/ML  SOLN  COMPARISON:  CT ANGIO CHEST W/CM &/OR WO/CM dated 12/21/2010  FINDINGS: Beginning in the superior segment of the external iliac vein, thrombus is visualized in the right external iliac vein and right common femoral vein extending into the thigh. The common femoral vein is very distended with clot. Thrombus nearly extends into the right common iliac vein. No thrombus is identified in the IVC.  No mass-effect is identified on venous structures. There is no evidence of adenopathy. No left-sided femoral or iliac DVT is identified. The abdominal aorta is normally patent.  Visualized lung bases show a small right pleural effusion. The liver, gallbladder, pancreas, spleen, adrenal glands and kidneys are unremarkable. There is a simple cyst of the anterior left kidney measuring approximately 2 cm. No abnormal fluid collections are identified. The bladder is unremarkable. No bony abnormalities are seen.  Review of the MIP images confirms the above findings.  IMPRESSION: Right-sided iliofemoral DVT seen throughout the visualized common femoral and external iliac veins. There is no evidence of IVC thrombus or extrinsic mass effect on venous structures.   Electronically Signed   By: Irish Lack M.D.   On: 05/03/2013 17:07   Ir Infusion Thrombol Venous Initial (ms)  05/04/2013   CLINICAL DATA:  Right lower extremity DVT  EXAM: IR INFUSION THROMBOL VENOUS INITIAL (MS); IR ULTRASOUND GUIDANCE VASC ACCESS RIGHT; RIGHT EXTREMITY VENOGRAPHY; INFERIOR VENA CAVOGRAM  FLUOROSCOPY TIME:  6 min and 6 seconds.  MEDICATIONS AND MEDICAL HISTORY: Versed 1.5 mg, Fentanyl 75 mcg.  Additional Medications: None.  ANESTHESIA/SEDATION: Moderate sedation time:  25 minutes  CONTRAST:  30 cc Omnipaque 300  PROCEDURE: The procedure, risks, benefits, and alternatives were explained to the patient. Questions regarding the procedure were encouraged and answered. The patient understands and consents to the procedure.  The right popliteal fossa in the prone position was prepped with Betadine in a sterile fashion, and a sterile drape was applied covering the operative field. A sterile gown and sterile gloves were used for the procedure.  Under sonographic guidance, a micropuncture needle was inserted into the thrombosis popliteal vein and removed over a 018 wire. This was up sized to a glidewire. A 7 French sheath was inserted over the glidewire into the popliteal vein. Right lower extremity venography was performed.  A Kumpe the catheter was advanced over the glidewire to the femoral vein. The catheter was slowly advanced over the wire with intermittent injections for venography, to the level of the IVC. IVC venography was performed.  The thrombus was noted to be somewhat hard preventing easy passage  of the Kumpe the catheter. Angiojet and mechanical thrombectomy was therefore not performed today.  The Kumpe be was exchanged over a stiff glidewire for a 90 cm, 50 cm infusion length 5 French multi side hole catheter.  FINDINGS: Venography of the right lower extremity demonstrates massive DVT extending from the popliteal vein into the common iliac vein.  The final image demonstrates an infusion catheter extending from the right popliteal vein to the IVC. IVC filter is in place.  COMPLICATIONS: None  IMPRESSION: Successful establishment of access for right lower extremity venous lysis. TNK at 0.5 milligrams/hour was instituted.   Electronically Signed   By: Maryclare BeanArt  Hoss M.D.   On: 05/04/2013 14:00   Ir Rande Lawmanhromb F/u Eval Art/ven Basil DessSubseq Day (ms)  05/05/2013   CLINICAL DATA:  Right lower extremity DVT and status post placement of IVC filter with initiation of transcatheter thrombolytics  therapy for ileofemoral DVT yesterday. Fibrinogen has fallen during thrombus therapy to 100 this morning. There has also been persistent oozing around the right jugular central venous catheter.  EXAM: FOLLOWUP ANGIOGRAPHY OF THE RIGHT LOWER EXTREMITY DURING VENOUS FROM THE LIVER THERAPY.  ANESTHESIA/SEDATION: No conscious sedation was administered.  CONTRAST:  30 mL Omnipaque 300  FLUOROSCOPY TIME:  48 seconds.  PROCEDURE: The procedure risks, benefits, and alternatives were explained to the patient. Questions regarding the procedure were encouraged and answered. The patient understands and consents to the procedure.  The pre-existing thrombolytic infusion catheter was injected with contrast material and venography performed of the right lower extremity. Thrombolytic infusion was discontinued and the catheter connected to normal saline at a rate of 50 mL/hour. IV heparin was also held for 2 hr.  The right jugular dialysis catheter was then removed after sterile prep with chlorhexidine. A 2-0 Ethilon pursestring suture was applied at the catheter exit site. A sterile dressing was applied.  COMPLICATIONS: None  FINDINGS: The right femoral vein in the thigh, common femoral vein and iliac veins show mildly improved patency since the venogram yesterday. There remains a large burden of chronic appearing thrombus. Due to significant decrease in fibrinogen, thrombolytic infusion will be held for a few hours as a new fibrinogen level was checked. If there is a rise in fibrinogen, tenecteplase infusion will be restarted at a lower rate.  IMPRESSION: Follow-up angiography shows mildly improved venous patency during thrombolytic therapy. A significant amount of chronic appearing thrombus remains. Due to drop in fibrinogen, thrombolytic therapy will be held for a few hours and another fibrinogen level checked.   Electronically Signed   By: Irish LackGlenn  Yamagata M.D.   On: 05/05/2013 13:48        ASSESSMENT AND PLAN: 38 year old  gentleman with history of hypercoagulability initial thrombosis was in 2012 when he presented with bilateral pulmonary embolisms. He was treated with 1 year of anticoagulation with Coumadin. Now presenting with right lower extremity DVT which is extensive as noted by Doppler studies. Local thrombolytic therapy was tried but failed. 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 He is now on heparin. He does have low cardiolipin antibodies as well as a low protein C. 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 My recommendation would be to continue him on anticoagulation indefinitely. He should continue heparin and then begin Coumadin. Once Coumadin level is adequate certainly heparin can be discontinued. Question is whether he would be a good candidate for other anticoagulants such as xeralto. I think eventually we could place him on this. I did get the feeling that  there may be some compliance issue with this individual. However I have reiterated to him that he needs to be seen by his physicians on a very regular basis. He also understands that he will need to be seen by a hematologist as well.  I discussed all of the results of radiology as well as laboratory with the patient and his wife. He and she both demonstrated understanding.  I spent 30 minutes counseling the patient face to face. The total time spent in the appointment was 60 minutes in  counseling, evaluation, examination, and coordination of care   Drue Second, MD Medical/Oncology [facility] [phone] (beeper) [phone] (Office)  05/07/2013, 8:17 PM

## 2013-05-07 NOTE — Progress Notes (Signed)
[facility] PROGRESS NOTE  Tom Dalton ZOX:[id number] DOB: 1975/06/25 DOA: 05/03/2013 PCP: Lorretta Harp, MD  Assessment/Plan: Right leg DVT  - was on  heparin drip>> dc'ed, 3/19 AM and resumed 3/19 PM and pt had direct thrombolysis per IR with Tenecteplase drip till repeat arteriogram is done. Also had IVC filter placed - appreciate IR assistance -discussed with Dr Bonnielee Haff and as per IR not lysis was unsuccessful and stopped today, repeat arteriogram 3/20 only with little clot lysis -Pt is to continue heaprin and he recommended heme consult for further recs>> I consulted and discussed pt with Dr Welton Flakes and she recommends continuing heparin alone for the next 24-48hrs, then to start on coumadin and treat in house till INR is therapeutic(she does not recommend NOACs for this pt). 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 -Dr Park Breed states hematologist will follow up with pt next week inpatient, and he will also need outpt follow up -continue pain management -per IR pt will need to follow up in 1mos with IR for IVC removal and in 3mos again with IR -Continue thigh high Ted hose, and he will need to continue them on d/c as per IR recs -Will start on Coumadin per pharmacy this a.m. and transfer to telemetry Active Problems:  Leukocytosis  - Likely reactive secondary to extensive DVT No obvious source of infection, UA neg - resolved   Code Status: full Family Communication: Wife and family at bedside Disposition Plan: to home when medically ready   Consultants:  IR  Vascular per EDP   Procedures: S/P Rt lower extremity venogram with thrombolysis and   Placement of Inferior Vena Cava filter- Per Dr Bonnielee Haff 3/19   Antibiotics:  None  HPI/Subjective: Complaining of eyes watering bilaterally, denies chest pain and shortness of breath.  Objective: Filed Vitals:   05/07/13 1300  BP: 109/54  Pulse:    Temp:   Resp: 23    Intake/Output Summary (Last 24 hours) at 05/07/13 1440 Last data filed at 05/07/13 1300  Gross per 24 hour  Intake    527 ml  Output    775 ml  Net   -248 ml   Filed Weights   05/05/13 0500 05/06/13 0415 05/07/13 0500  Weight: 75 kg (165 lb 5.5 oz) 76.1 kg (167 lb 12.3 oz) 77 kg (169 lb 12.1 oz)    Exam:  General: alert & oriented x 3 in NAD Cardiovascular: RRR, nl S1 s2; right IJ site with dressing clean and dry Respiratory: CTAB Abdomen: soft +BS NT/ND, no masses palpable Extremities: RLE less tight, edema about the same, TED hose in place.      Data Rewed: Basic Metabolic Panel:  Recent Labs Lab 05/03/13 0950 05/04/13 0449  NA 137 136*  K 4.1 4.1  CL 95* 100  CO2 24 23  GLUCOSE 136* 87  BUN 19 14  CREATININE 1.25 1.34  CALCIUM 10.1 8.9   Liver Function Tests:  Recent Labs Lab 05/04/13 0449  AST 16  ALT 19  ALKPHOS 62  BILITOT 2.0*  PROT 7.5  ALBUMIN 3.5   No results found for this basename: LIPASE, AMYLASE,  in the last 168 hours No results found for this basename: AMMONIA,  in the last 168 hours CBC:  Recent Labs Lab 05/03/13 0950  05/05/13 0054 05/05/13 0500 05/05/13 1255 05/06/13 0644 05/07/13 0328  WBC 11.1*  < > 8.3 8.5 7.7 7.5 5.5  NEUTROABS 8.5*  --   --   --   --   --   --  HGB 14.3  < > 12.1* 12.0* 11.4* 10.7* 10.1*  HCT 42.3  < > 36.4* 36.2* 34.4* 32.4* 30.2*  MCV 71.3*  < > 71.7* 71.7* 71.7* 71.7* 71.2*  PLT 208  < > 156 157 139* 141* 168  < > = values in this interval not displayed. Cardiac Enzymes: No results found for this basename: CKTOTAL, CKMB, CKMBINDEX, TROPONINI,  in the last 168 hours BNP (last 3 results) No results found for this basename: PROBNP,  in the last 8760 hours CBG:  Recent Labs Lab 05/04/13 0801 05/05/13 0807 05/06/13 0745 05/07/13 0754  GLUCAP 83 105* 107* 96    Recent Results (from the past 240 hour(s))  MRSA PCR SCREENING     Status: None   Collection Time     05/04/13  1:04 PM      Result Value Ref Range Status   MRSA by PCR NEGATIVE  NEGATIVE Final   Comment:            The GeneXpert MRSA Assay (FDA     approved for NASAL specimens     only), is one component of a     comprehensive MRSA colonization     surveillance program. It is not     intended to diagnose MRSA     infection nor to guide or     monitor treatment for     MRSA infections.     Studies: No results found.  Scheduled Meds: . sodium chloride  3 mL Intravenous Q12H   Continuous Infusions: . sodium chloride 10 mL/hr at 05/07/13 0700  . heparin 1,300 Units/hr (05/07/13 0700)    Principal Problem:   Right leg DVT Active Problems:   Leukocytosis    Time spent: 25    [facility] HospitalVIYUOH,Lynnzie Blackson C  [facility] Pager [phone]. If 7PM-7AM, please contact night-coverage at [email or web address], password [email or web address] 05/07/2013, 2:40 PM  LOS: 4 days

## 2013-05-08 LAB — BASIC METABOLIC PANEL
BUN: 12 mg/dL (ref 6–23)
CHLORIDE: 102 meq/L (ref 96–112)
CO2: 27 meq/L (ref 19–32)
Calcium: 9.1 mg/dL (ref 8.4–10.5)
Creatinine, Ser: 1.21 mg/dL (ref 0.50–1.35)
GFR calc Af Amer: 86 mL/min — ABNORMAL LOW (ref >90)
GFR calc non Af Amer: 74 mL/min — ABNORMAL LOW (ref >90)
GLUCOSE: 94 mg/dL (ref 70–99)
Potassium: 4 mEq/L (ref 3.7–5.3)
SODIUM: 141 meq/L (ref 137–147)

## 2013-05-08 LAB — CBC
HEMATOCRIT: 30.9 % — AB (ref 39.0–52.0)
HEMOGLOBIN: 10.5 g/dL — AB (ref 13.0–17.0)
MCH: 24.3 pg — ABNORMAL LOW (ref 26.0–34.0)
MCHC: 34 g/dL (ref 30.0–36.0)
MCV: 71.5 fL — ABNORMAL LOW (ref 78.0–100.0)
Platelets: 194 10*3/uL (ref 150–400)
RBC: 4.32 MIL/uL (ref 4.22–5.81)
RDW: 14.6 % (ref 11.5–15.5)
WBC: 5.7 10*3/uL (ref 4.0–10.5)

## 2013-05-08 LAB — HEPARIN LEVEL (UNFRACTIONATED): Heparin Unfractionated: 0.5 IU/mL (ref 0.30–0.70)

## 2013-05-08 LAB — PROTIME-INR
INR: 1.05 (ref 0.00–1.49)
Prothrombin Time: 13.5 seconds (ref 11.6–15.2)

## 2013-05-08 MED ORDER — WARFARIN SODIUM 10 MG PO TABS
10.0000 mg | ORAL_TABLET | Freq: Once | ORAL | Status: AC
  Administered 2013-05-08: 10 mg via ORAL
  Filled 2013-05-08: qty 1

## 2013-05-08 NOTE — Progress Notes (Signed)
[facility] PROGRESS NOTE  Gurkirat Basher ZOX:[id number] DOB: 04-02-1975 DOA: 05/03/2013 PCP: Lorretta Harp, MD  Assessment/Plan: Right leg DVT  - was on  heparin drip>> dc'ed, 3/19 AM and resumed 3/19 PM and pt had direct thrombolysis per IR with Tenecteplase drip till repeat arteriogram is done. Also had IVC filter placed - appreciate IR assistance -discussed with Dr Bonnielee Haff and as per IR not lysis was unsuccessful and stopped today, repeat arteriogram 3/20 only with little clot lysis -Pt is to continue heaprin and he recommended heme consult for further recs>> I consulted and discussed pt with Dr Welton Flakes and she recommends continuing heparin alone for the next 24-48hrs, then to start on coumadin and treat in house till INR is therapeutic(she does not recommend NOACs for this pt). 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 -Dr Park Breed states hematologist will follow up with pt next week inpatient, and he will also need outpt follow up -continue pain management -per IR pt will need to follow up in 1mos with IR for IVC removal and in 3mos again with IR -Continue thigh high Ted hose, and he will need to continue them on d/c as per IR recs -continue Coumadin per pharmacy till INR therapeutic Active Problems:  Leukocytosis  - Likely reactive secondary to extensive DVT No obvious source of infection, UA neg - resolved   Code Status: full Family Communication: Wife and family at bedside Disposition Plan: to home when medically ready   Consultants:  IR  Vascular per EDP   Procedures: S/P Rt lower extremity venogram with thrombolysis and   Placement of Inferior Vena Cava filter- Per Dr Bonnielee Haff 3/19   Antibiotics:  None  HPI/Subjective: Pt denies any c/o.  Objective: Filed Vitals:   05/08/13 1354  BP: 119/56  Pulse: 67  Temp: 98.2 F (36.8 C)  Resp: 18    Intake/Output Summary (Last 24 hours) at  05/08/13 1736 Last data filed at 05/08/13 1300  Gross per 24 hour  Intake    503 ml  Output      0 ml  Net    503 ml   Filed Weights   05/06/13 0415 05/07/13 0500 05/08/13 0500  Weight: 76.1 kg (167 lb 12.3 oz) 77 kg (169 lb 12.1 oz) 75.433 kg (166 lb 4.8 oz)    Exam:  General: alert & oriented x 3 in NAD Cardiovascular: RRR, nl S1 s2; right IJ site with dressing clean and dry Respiratory: CTAB Abdomen: soft +BS NT/ND, no masses palpable Extremities: RLE less tight, edema about the same, TED hose in place.      Data Rewed: Basic Metabolic Panel:  Recent Labs Lab 05/03/13 0950 05/04/13 0449 05/08/13 0540  NA 137 136* 141  K 4.1 4.1 4.0  CL 95* 100 102  CO2 24 23 27   GLUCOSE 136* 87 94  BUN 19 14 12   CREATININE 1.25 1.34 1.21  CALCIUM 10.1 8.9 9.1   Liver Function Tests:  Recent Labs Lab 05/04/13 0449  AST 16  ALT 19  ALKPHOS 62  BILITOT 2.0*  PROT 7.5  ALBUMIN 3.5   No results found for this basename: LIPASE, AMYLASE,  in the last 168 hours No results found for this basename: AMMONIA,  in the last 168 hours CBC:  Recent Labs Lab 05/03/13 0950  05/05/13 0500 05/05/13 1255 05/06/13 0644 05/07/13 0328 05/08/13 0540  WBC 11.1*  < > 8.5 7.7 7.5 5.5 5.7  NEUTROABS 8.5*  --   --   --   --   --   --   HGB 14.3  < > 12.0* 11.4* 10.7* 10.1* 10.5*  HCT 42.3  < > 36.2* 34.4* 32.4* 30.2* 30.9*  MCV 71.3*  < > 71.7* 71.7* 71.7* 71.2* 71.5*  PLT 208  < > 157 139* 141* 168 194  < > = values in this interval not displayed. Cardiac Enzymes: No results found for this basename: CKTOTAL, CKMB, CKMBINDEX, TROPONINI,  in the last 168 hours BNP (last 3 results) No results found for this basename: PROBNP,  in the last 8760 hours CBG:  Recent Labs Lab 05/04/13 0801 05/05/13 0807 05/06/13 0745 05/07/13 0754  GLUCAP 83 105* 107* 96    Recent Results (from the past 240 hour(s))  MRSA PCR SCREENING     Status: None   Collection Time    05/04/13  1:04 PM       Result Value Ref Range Status   MRSA by PCR NEGATIVE  NEGATIVE Final   Comment:            The GeneXpert MRSA Assay (FDA     approved for NASAL specimens     only), is one component of a     comprehensive MRSA colonization     surveillance program. It is not     intended to diagnose MRSA     infection nor to guide or     monitor treatment for     MRSA infections.     Studies: No results found.  Scheduled Meds: . sodium chloride  3 mL Intravenous Q12H  . warfarin  1 each Does not apply Once  . Warfarin - Pharmacist Dosing Inpatient   Does not apply q1800   Continuous Infusions: . sodium chloride 10 mL/hr at 05/07/13 1800  . heparin 1,300 Units/hr (05/08/13 0805)    Principal Problem:   Right leg DVT Active Problems:   Leukocytosis    Time spent: 25    [facility] End[facility] CenterVIYUOH,Tandy Lewin C  [facility] Pager [phone]. If 7PM-7AM, please contact night-coverage at [email or web address], password [facility] [email or web address] 05/08/2013, 5:36 PM  LOS: 5 days

## 2013-05-08 NOTE — Progress Notes (Signed)
ANTICOAGULATION CONSULT NOTE - Follow Up Consult  Pharmacy Consult for Heparin and coumadin Indication: DVT  No Known Allergies  Patient Measurements: Height: 5\' 4"  (162.6 cm) Weight: 166 lb 4.8 oz (75.433 kg) IBW/kg (Calculated) : 59.2   Vital Signs: Temp: 97.3 F (36.3 C) (03/23 0500) Temp src: Oral (03/23 0500) BP: 113/67 mmHg (03/23 0500) Pulse Rate: 58 (03/23 0500)  Labs:  Recent Labs  05/06/13 0644  05/06/13 1605 05/07/13 0328 05/08/13 0540  HGB 10.7*  --   --  10.1* 10.5*  HCT 32.4*  --   --  30.2* 30.9*  PLT 141*  --   --  168 194  LABPROT  --   --   --   --  13.5  INR  --   --   --   --  1.05  HEPARINUNFRC  --   < > 0.35 0.46 0.50  CREATININE  --   --   --   --  1.21  < > = values in this interval not displayed.  Estimated Creatinine Clearance: 76.2 ml/min (by C-G formula based on Cr of 1.21).  Assessment: 38yo male on Heparin for DVT, s/p TNKase with no plans to resume.  Heparin level 0.5 on 1300 units/hr, Hg 10.5 and Pltc 194.  No bleeding problems noted.  Goal of Therapy:  Heparin level 0.3-0.7 units/ml Monitor platelets by anticoagulation protocol: Yes INR 2-3   Plan:  1. Day # 2/5 of minimum overlap of injectible therapy with oral therapy. 2. Continue heparin drip at 1300 units/hr 3. Coumadin 10 mg po x 1 dose 4. Is he a candidate for LMWH tx?   5. Daily HL, CBC, INR Herby AbrahamMichelle T. Christepher Melchior, Pharm.D. [phone] 05/08/2013 9:38 AM

## 2013-05-09 LAB — CBC
HEMATOCRIT: 31.6 % — AB (ref 39.0–52.0)
HEMOGLOBIN: 10.5 g/dL — AB (ref 13.0–17.0)
MCH: 23.9 pg — ABNORMAL LOW (ref 26.0–34.0)
MCHC: 33.2 g/dL (ref 30.0–36.0)
MCV: 71.8 fL — ABNORMAL LOW (ref 78.0–100.0)
Platelets: 216 10*3/uL (ref 150–400)
RBC: 4.4 MIL/uL (ref 4.22–5.81)
RDW: 14.8 % (ref 11.5–15.5)
WBC: 5.4 10*3/uL (ref 4.0–10.5)

## 2013-05-09 LAB — HEPARIN LEVEL (UNFRACTIONATED): Heparin Unfractionated: 0.68 IU/mL (ref 0.30–0.70)

## 2013-05-09 LAB — PROTIME-INR
INR: 2.12 — AB (ref 0.00–1.49)
PROTHROMBIN TIME: 23.1 s — AB (ref 11.6–15.2)

## 2013-05-09 MED ORDER — WARFARIN SODIUM 1 MG PO TABS
1.0000 mg | ORAL_TABLET | Freq: Once | ORAL | Status: AC
  Administered 2013-05-09: 1 mg via ORAL
  Filled 2013-05-09: qty 1

## 2013-05-09 NOTE — Discharge Instructions (Addendum)
Follow up appointment for INR check at the Coumadin clinic 3/31 at 11 am [facility] Telephone# [phone].     Information on my medicine - Coumadin   (Warfarin)  This medication education was reviewed with me or my healthcare representative as part of my discharge preparation.  The pharmacist that spoke with me during my hospital stay was:  Herby AbrahamBell, Michelle T, [facility] HospitalRPH  Why was Coumadin prescribed for you? Coumadin was prescribed for you because you have a blood clot or a medical condition that can cause an increased risk of forming blood clots. Blood clots can cause serious health problems by blocking the flow of blood to the heart, lung, or brain. Coumadin can prevent harmful blood clots from forming. As a reminder your indication for Coumadin is:   Blood Clotting Disorder  What test will check on my response to Coumadin? While on Coumadin (warfarin) you will need to have an INR test regularly to ensure that your dose is keeping you in the desired range. The INR (international normalized ratio) number is calculated from the result of the laboratory test called prothrombin time (PT).  If an INR APPOINTMENT HAS NOT ALREADY BEEN MADE FOR YOU please schedule an appointment to have this lab work done by your health care provider within 7 days. Your INR goal is usually a number between:  2 to 3 or your provider may give you a more narrow range like 2-2.5.  Ask your health care provider during an office visit what your goal INR is.  What  do you need to  know  About  COUMADIN? Take Coumadin (warfarin) exactly as prescribed by your healthcare provider about the same time each day.  DO NOT stop taking without talking to the doctor who prescribed the medication.  Stopping without other blood clot prevention medication to take the place of Coumadin may increase your risk of developing a new clot or stroke.  Get refills before you run out.  What do  you do if you miss a dose? If you miss a dose, take it as soon as you remember on the same day then continue your regularly scheduled regimen the next day.  Do not take two doses of Coumadin at the same time.  Important Safety Information A possible side effect of Coumadin (Warfarin) is an increased risk of bleeding. You should call your healthcare provider right away if you experience any of the following:   Bleeding from an injury or your nose that does not stop.   Unusual colored urine (red or dark brown) or unusual colored stools (red or black).   Unusual bruising for unknown reasons.   A serious fall or if you hit your head (even if there is no bleeding).  Some foods or medicines interact with Coumadin (warfarin) and might alter your response to warfarin. To help avoid this:   Eat a balanced diet, maintaining a consistent amount of Vitamin K.   Notify your provider about major diet changes you plan to make.   Avoid alcohol or limit your intake to 1 drink for women and 2 drinks for men per day. (1 drink is 5 oz. wine, 12 oz. beer, or 1.5 oz. liquor.)  Make sure that ANY health care provider who prescribes medication for you knows that you are taking Coumadin (warfarin).  Also make sure the healthcare provider who is monitoring your Coumadin knows when you have started a new medication including herbals and  non-prescription products.  Coumadin (Warfarin)  Major Drug Interactions  Increased Warfarin Effect Decreased Warfarin Effect  Alcohol (large quantities) Antibiotics (esp. Septra/Bactrim, Flagyl, Cipro) Amiodarone (Cordarone) Aspirin (ASA) Cimetidine (Tagamet) Megestrol (Megace) NSAIDs (ibuprofen, naproxen, etc.) Piroxicam (Feldene) Propafenone (Rythmol SR) Propranolol (Inderal) Isoniazid (INH) Posaconazole (Noxafil) Barbiturates (Phenobarbital) Carbamazepine (Tegretol) Chlordiazepoxide (Librium) Cholestyramine (Questran) Griseofulvin Oral  Contraceptives Rifampin Sucralfate (Carafate) Vitamin K   Coumadin (Warfarin) Major Herbal Interactions  Increased Warfarin Effect Decreased Warfarin Effect  Garlic Ginseng Ginkgo biloba Coenzyme Q10 Green tea St. Johns wort    Coumadin (Warfarin) FOOD Interactions  Eat a consistent number of servings per week of foods HIGH in Vitamin K (1 serving =  cup)  Collards (cooked, or boiled & drained) Kale (cooked, or boiled & drained) Mustard greens (cooked, or boiled & drained) Parsley *serving size only =  cup Spinach (cooked, or boiled & drained) Swiss chard (cooked, or boiled & drained) Turnip greens (cooked, or boiled & drained)  Eat a consistent number of servings per week of foods MEDIUM-HIGH in Vitamin K (1 serving = 1 cup)  Asparagus (cooked, or boiled & drained) Broccoli (cooked, boiled & drained, or raw & chopped) Brussel sprouts (cooked, or boiled & drained) *serving size only =  cup Lettuce, raw (green leaf, endive, romaine) Spinach, raw Turnip greens, raw & chopped   These websites have more information on Coumadin (warfarin):  FailFactory.se; VeganReport.com.au;

## 2013-05-09 NOTE — Care Management Note (Signed)
    Page 1 of 1   05/11/2013     4:06:43 PM   CARE MANAGEMENT NOTE 05/11/2013  Patient:  Pilar PlateYUN,Moishe   Account Number:  [id number]  Date Initiated:  05/09/2013  Documentation initiated by:  Mattison Stuckey  Subjective/Objective Assessment:   PT ADM ON 3/18 WITH EXTENSIVE DVT REQUIRING TNK, IVC FILTER.  PTA, PT INDEPENDENT OF ADLS.     Action/Plan:   WILL FOLLOW FOR DC NEEDS AS PT PROGRESSES.   Anticipated DC Date:  05/12/2013   Anticipated DC Plan:  HOME/SELF CARE      DC Planning Services  CM consult      Choice offered to / List presented to:             Status of service:  Completed, signed off Medicare Important Message given?   (If response is "NO", the following Medicare IM given date fields will be blank) Date Medicare IM given:   Date Additional Medicare IM given:    Discharge Disposition:  HOME/SELF CARE  Per UR Regulation:  Reviewed for med. necessity/level of care/duration of stay  If discussed at Long Length of Stay Meetings, dates discussed:   05/09/2013  05/11/2013    Comments:  05/11/13 Devyn Sheerin,RN,BSN [phone] PER DR GHERGE, COUMADIN FOLLOW UP HAS BEEN ARRANGED AT [facility].  APPT ON AVS.

## 2013-05-09 NOTE — Progress Notes (Signed)
ANTICOAGULATION CONSULT NOTE - Follow Up Consult  Pharmacy Consult for Heparin and coumadin Indication: DVT  No Known Allergies  Patient Measurements: Height: 5\' 4"  (162.6 cm) Weight: 166 lb 14.2 oz (75.7 kg) IBW/kg (Calculated) : 59.2   Vital Signs: Temp: 98 F (36.7 C) (03/24 0503) Temp src: Oral (03/24 0503) BP: 105/52 mmHg (03/24 0503) Pulse Rate: 57 (03/24 0503)  Labs:  Recent Labs  05/07/13 0328 05/08/13 0540 05/09/13 0630  HGB 10.1* 10.5* 10.5*  HCT 30.2* 30.9* 31.6*  PLT 168 194 216  LABPROT  --  13.5 23.1*  INR  --  1.05 2.12*  HEPARINUNFRC 0.46 0.50 0.68  CREATININE  --  1.21  --     Estimated Creatinine Clearance: 76.3 ml/min (by C-G formula based on Cr of 1.21).  Assessment: 38yo male on Heparin for DVT, s/p TNKase with no plans to resume.  Heparin level 0.68 on 1300 units/hr, Hg 10.5 and Pltc 216.  No bleeding problems noted.  His INR has jumped up to 2.12 today.  Large 9.6 second jump in protime.  On day # 3/5 of minimum overlap required for VTE treatment.  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    Goal of Therapy:  Heparin level 0.3-0.7 units/ml Monitor platelets by anticoagulation protocol: Yes INR 2-3   Plan:  1. Day # 3/5 of minimum overlap of injectible therapy with oral therapy for VTE treatment 2. decrease heparin drip to 1200 units/hr 3. Coumadin 1 mg po x 1 dose 4. Daily HL, CBC, INR Herby AbrahamMichelle T. Kalliopi Coupland, Pharm.D. [phone] 05/09/2013 11:08 AM

## 2013-05-09 NOTE — Progress Notes (Signed)
[facility] PROGRESS NOTE  Markee Matera ZOX:[id number] DOB: 1975/11/26 DOA: 05/03/2013 PCP: Lorretta Harp, MD  Assessment/Plan: Right leg DVT  - was on  heparin drip>> dc'ed, 3/19 AM and resumed 3/19 PM and pt had direct thrombolysis per IR with Tenecteplase drip till repeat arteriogram is done. Also had IVC filter placed - appreciate IR assistance -discussed with Dr Bonnielee Haff and as per IR note lysis was unsuccessful and stopped as repeat arteriogram 3/20 only with little clot lysis -Pt is to continue heaprin and he recommended heme consult for further recs>> I consulted and discussed pt with Dr Welton Flakes and she recommends continuing heparin alone for the next 24-48hrs, then to start on coumadin(3/22) and treat in house till INR is therapeutic(she does not recommend NOACs for this pt at this time). 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 -Dr Park Breed states hematologist will follow up with pt next week inpatient, and he will also need outpt follow up -continue pain management -per IR pt will need to follow up in 1mos with IR for IVC removal and in 3mos again with IR -Continue thigh high Ted hose, and he will need to continue them on d/c as per IR recs -Patient will also need to follow up outpatient with hematology-Dr. Bertis Ruddy -continue Coumadin per pharmacy till INR therapeutic-we'll also INR is 2.12 today it is only a 3/5 of the Coumadin/heparin, follow.  Active Problems:  Leukocytosis  - Likely reactive secondary to extensive DVT No obvious source of infection, UA neg - resolved   Code Status: full Family Communication: Wife and family at bedside Disposition Plan: to home when medically ready   Consultants:  IR  Vascular per EDP   Procedures: S/P Rt lower extremity venogram with thrombolysis and   Placement of Inferior Vena Cava filter- Per Dr Bonnielee Haff  3/19   Antibiotics:  None  HPI/Subjective: Pt denies any c/o.  Objective: Filed Vitals:   05/09/13 0503  BP: 105/52  Pulse: 57  Temp: 98 F (36.7 C)  Resp: 18    Intake/Output Summary (Last 24 hours) at 05/09/13 1330 Last data filed at 05/09/13 0900  Gross per 24 hour  Intake    600 ml  Output      0 ml  Net    600 ml   Filed Weights   05/07/13 0500 05/08/13 0500 05/09/13 0656  Weight: 77 kg (169 lb 12.1 oz) 75.433 kg (166 lb 4.8 oz) 75.7 kg (166 lb 14.2 oz)    Exam:  General: alert & oriented x 3 in NAD Cardiovascular: RRR, nl S1 s2; right IJ site with dressing clean and dry Respiratory: CTAB Abdomen: soft +BS NT/ND, no masses palpable Extremities: RLE less tight, edema about the same, TED hose in place.      Data Rewed: Basic Metabolic Panel:  Recent Labs Lab 05/03/13 0950 05/04/13 0449 05/08/13 0540  NA 137 136* 141  K 4.1 4.1 4.0  CL 95* 100 102  CO2 24 23 27   GLUCOSE 136* 87 94  BUN 19 14 12   CREATININE 1.25 1.34 1.21  CALCIUM 10.1 8.9 9.1   Liver Function Tests:  Recent Labs Lab 05/04/13 0449  AST 16  ALT 19  ALKPHOS 62  BILITOT 2.0*  PROT 7.5  ALBUMIN 3.5   No results found for this basename: LIPASE, AMYLASE,  in the last 168 hours No results found for this basename: AMMONIA,  in the last 168 hours CBC:  Recent Labs Lab 05/03/13 0950  05/05/13 1255 05/06/13 0644 05/07/13 0328 05/08/13 0540 05/09/13 0630  WBC 11.1*  < > 7.7 7.5 5.5 5.7 5.4  NEUTROABS 8.5*  --   --   --   --   --   --   HGB 14.3  < > 11.4* 10.7* 10.1* 10.5* 10.5*  HCT 42.3  < > 34.4* 32.4* 30.2* 30.9* 31.6*  MCV 71.3*  < > 71.7* 71.7* 71.2* 71.5* 71.8*  PLT 208  < > 139* 141* 168 194 216  < > = values in this interval not displayed. Cardiac Enzymes: No results found for this basename: CKTOTAL, CKMB, CKMBINDEX, TROPONINI,  in the last 168 hours BNP (last 3 results) No results found for this basename: PROBNP,  in the last 8760 hours CBG:  Recent  Labs Lab 05/04/13 0801 05/05/13 0807 05/06/13 0745 05/07/13 0754  GLUCAP 83 105* 107* 96    Recent Results (from the past 240 hour(s))  MRSA PCR SCREENING     Status: None   Collection Time    05/04/13  1:04 PM      Result Value Ref Range Status   MRSA by PCR NEGATIVE  NEGATIVE Final   Comment:            The GeneXpert MRSA Assay (FDA     approved for NASAL specimens     only), is one component of a     comprehensive MRSA colonization     surveillance program. It is not     intended to diagnose MRSA     infection nor to guide or     monitor treatment for     MRSA infections.     Studies: No results found.  Scheduled Meds: . sodium chloride  3 mL Intravenous Q12H  . warfarin  1 mg Oral ONCE-1800  . warfarin  1 each Does not apply Once  . Warfarin - Pharmacist Dosing Inpatient   Does not apply q1800   Continuous Infusions: . sodium chloride 10 mL/hr at 05/07/13 1800  . heparin 1,200 Units/hr (05/09/13 1145)    Principal Problem:   Right leg DVT Active Problems:   Leukocytosis    Time spent: 25    [facility] HospitalVIYUOH,Ellwyn Ergle C  [facility] Pager [phone]. If 7PM-7AM, please contact night-coverage at [email or web address], password [email or web address] 05/09/2013, 1:30 PM  LOS: 6 days

## 2013-05-10 LAB — PROTIME-INR
INR: 2 — AB (ref 0.00–1.49)
PROTHROMBIN TIME: 22.1 s — AB (ref 11.6–15.2)

## 2013-05-10 LAB — CBC
HCT: 31.8 % — ABNORMAL LOW (ref 39.0–52.0)
HEMOGLOBIN: 10.5 g/dL — AB (ref 13.0–17.0)
MCH: 23.9 pg — ABNORMAL LOW (ref 26.0–34.0)
MCHC: 33 g/dL (ref 30.0–36.0)
MCV: 72.3 fL — ABNORMAL LOW (ref 78.0–100.0)
PLATELETS: 231 10*3/uL (ref 150–400)
RBC: 4.4 MIL/uL (ref 4.22–5.81)
RDW: 14.9 % (ref 11.5–15.5)
WBC: 7.3 10*3/uL (ref 4.0–10.5)

## 2013-05-10 LAB — GLUCOSE, CAPILLARY: GLUCOSE-CAPILLARY: 88 mg/dL (ref 70–99)

## 2013-05-10 LAB — HEPARIN LEVEL (UNFRACTIONATED): Heparin Unfractionated: 0.26 IU/mL — ABNORMAL LOW (ref 0.30–0.70)

## 2013-05-10 MED ORDER — WARFARIN SODIUM 5 MG PO TABS
5.0000 mg | ORAL_TABLET | Freq: Once | ORAL | Status: AC
  Administered 2013-05-10: 5 mg via ORAL
  Filled 2013-05-10: qty 1

## 2013-05-10 NOTE — Progress Notes (Signed)
ANTICOAGULATION CONSULT NOTE - Follow Up Consult  Pharmacy Consult for Heparin and coumadin Indication: DVT  No Known Allergies  Patient Measurements: Height: 5\' 4"  (162.6 cm) Weight: 167 lb 12.3 oz (76.1 kg) IBW/kg (Calculated) : 59.2   Vital Signs: Temp: 97.7 F (36.5 C) (03/25 0434) Temp src: Oral (03/25 0434) BP: 98/51 mmHg (03/25 0434) Pulse Rate: 61 (03/25 0434)  Labs:  Recent Labs  05/08/13 0540 05/09/13 0630 05/10/13 0318  HGB 10.5* 10.5* 10.5*  HCT 30.9* 31.6* 31.8*  PLT 194 216 231  LABPROT 13.5 23.1* 22.1*  INR 1.05 2.12* 2.00*  HEPARINUNFRC 0.50 0.68 0.26*  CREATININE 1.21  --   --     Estimated Creatinine Clearance: 76.5 ml/min (by C-G formula based on Cr of 1.21).  Assessment: 38yo male on heparin and coumadin for DVT, s/p  unsuccessful RLE TNKase infusion 3/19-3/20 and placement of IVC filter 3/19.    Heparin level 0.26 on 1200 units/hr which is slightly below goal HL.  Hg 10.5 and Pltc 231.  No bleeding problems noted.  His INR is 2.0 today.  On day # 4/5 of minimum overlap required for VTE treatment.  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  Coumadin education completed 3/24.   In the past his coumadin dose was 25 mg/week = 3.5 mg/day= 2.5 mg TTSS and 5 MWF equivalent.   Goal of Therapy:  Heparin level 0.3-0.7 units/ml Monitor platelets by anticoagulation protocol: Yes INR 2-3   Plan:  1. Day # 4/5 of minimum overlap of injectible therapy with oral therapy for VTE treatment 2. increase heparin drip to 1250 units/hr 3. Coumadin 5 mg po x  dose 4. Daily HL, CBC, INR  Herby AbrahamMichelle T. Kip Cropp, Pharm.D. [phone] 05/10/2013 8:33 AM

## 2013-05-10 NOTE — Progress Notes (Addendum)
[facility] PROGRESS NOTE  Shreyansh Tiffany ZOX:[id number] DOB: [birth year] DOA: 05/03/2013 PCP: Lorretta Harp, MD  Assessment/Plan: Right leg DVT  - was on  heparin drip>> dc'ed, 3/19 AM and resumed 3/19 PM and pt had direct thrombolysis per IR with Tenecteplase drip till repeat arteriogram is done. Also had IVC filter placed - appreciate IR assistance -discussed with Dr Bonnielee Haff and as per IR note lysis was unsuccessful and stopped as repeat arteriogram 3/20 only with little clot lysis -Pt is to continue heaprin and he recommended heme consult for further recs>> I consulted and discussed pt with Dr Welton Flakes and she recommends continuing heparin alone for the next 24-48hrs, then to start on coumadin(3/22) and treat in house till INR is therapeutic(she does not recommend NOACs for this pt at this time). 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 -Dr Park Breed states hematologist will follow up with pt next week inpatient, and he will also need outpt follow up -continue pain management -per IR pt will need to follow up in 1mos with IR for IVC removal and in 3mos again with IR -Continue thigh high Ted hose, and he will need to continue them on d/c as per IR recs -Patient will also need to follow up outpatient with hematology-Dr. Bertis Ruddy -continue Coumadin per pharmacy till INR therapeutic-we'll also INR is 2.12 today it is only a 3/5 of the Coumadin/heparin, follow.  Leukocytosis  - Likely reactive secondary to extensive DVT No obvious source of infection, UA neg - resolved  Code Status: full Family Communication: d/w patient Disposition Plan: to home when medically ready. 24 more hours of IV heparin. Likely d/c home in am.   Consultants:  IR  Vascular per EDP  Procedures: S/P Rt lower extremity venogram with thrombolysis and   Placement of Inferior Vena Cava filter- Per Dr Bonnielee Haff  3/19  Antibiotics:  None  HPI/Subjective: Pt denies any c/o.  Objective: Filed Vitals:   05/10/13 1359  BP: 113/52  Pulse: 63  Temp: 97.5 F (36.4 C)  Resp: 18    Intake/Output Summary (Last 24 hours) at 05/10/13 1437 Last data filed at 05/10/13 1300  Gross per 24 hour  Intake    603 ml  Output      0 ml  Net    603 ml   Filed Weights   05/08/13 0500 05/09/13 0656 05/10/13 0434  Weight: 75.433 kg (166 lb 4.8 oz) 75.7 kg (166 lb 14.2 oz) 76.1 kg (167 lb 12.3 oz)    Exam:  General: alert & oriented x 3 in NAD Cardiovascular: RRR, nl S1 s2; right IJ site with dressing clean and dry Respiratory: CTAB Abdomen: soft +BS NT/ND, no masses palpable Extremities: RLE less tight, edema about the same, TED hose in place.      Data Rewed: Basic Metabolic Panel:  Recent Labs Lab 05/04/13 0449 05/08/13 0540  NA 136* 141  K 4.1 4.0  CL 100 102  CO2 23 27  GLUCOSE 87 94  BUN 14 12  CREATININE 1.34 1.21  CALCIUM 8.9 9.1   Liver Function Tests:  Recent Labs Lab 05/04/13 0449  AST 16  ALT 19  ALKPHOS 62  BILITOT 2.0*  PROT 7.5  ALBUMIN 3.5   CBC:  Recent Labs Lab 05/06/13 0644 05/07/13 0328 05/08/13 0540 05/09/13 0630 05/10/13 0318  WBC 7.5 5.5 5.7 5.4 7.3  HGB 10.7* 10.1* 10.5* 10.5* 10.5*  HCT 32.4* 30.2* 30.9* 31.6* 31.8*  MCV 71.7* 71.2* 71.5* 71.8* 72.3*  PLT 141* 168 194 216 231   CBG:  Recent Labs Lab 05/04/13 0801 05/05/13 0807 05/06/13 0745 05/07/13 0754 05/10/13 0604  GLUCAP 83 105* 107* 96 88    Recent Results (from the past 240 hour(s))  MRSA PCR SCREENING     Status: None   Collection Time    05/04/13  1:04 PM      Result Value Ref Range Status   MRSA by PCR NEGATIVE  NEGATIVE Final   Comment:            The GeneXpert MRSA Assay (FDA     approved for NASAL specimens     only), is one component of a     comprehensive MRSA colonization     surveillance program. It is not     intended to diagnose MRSA     infection nor  to guide or     monitor treatment for     MRSA infections.     Studies: No results found.  Scheduled Meds: . sodium chloride  3 mL Intravenous Q12H  . warfarin  5 mg Oral ONCE-1800  . warfarin  1 each Does not apply Once  . Warfarin - Pharmacist Dosing Inpatient   Does not apply q1800   Continuous Infusions: . sodium chloride 10 mL/hr at 05/07/13 1800  . heparin 1,250 Units/hr (05/10/13 0840)    Principal Problem:   Right leg DVT Active Problems:   Leukocytosis  Time spent: 25  Pamella PertGHERGHE, Joniel Graumann  [facility] Pager [phone]. If 7PM-7AM, please contact night-coverage at [email or web address], password [email or web address] 05/10/2013, 2:37 PM  LOS: 7 days

## 2013-05-11 LAB — HEPARIN LEVEL (UNFRACTIONATED): Heparin Unfractionated: 0.19 IU/mL — ABNORMAL LOW (ref 0.30–0.70)

## 2013-05-11 LAB — CBC
HCT: 31.5 % — ABNORMAL LOW (ref 39.0–52.0)
Hemoglobin: 10.3 g/dL — ABNORMAL LOW (ref 13.0–17.0)
MCH: 23.7 pg — ABNORMAL LOW (ref 26.0–34.0)
MCHC: 32.7 g/dL (ref 30.0–36.0)
MCV: 72.6 fL — ABNORMAL LOW (ref 78.0–100.0)
PLATELETS: 229 10*3/uL (ref 150–400)
RBC: 4.34 MIL/uL (ref 4.22–5.81)
RDW: 15 % (ref 11.5–15.5)
WBC: 7.8 10*3/uL (ref 4.0–10.5)

## 2013-05-11 LAB — GLUCOSE, CAPILLARY: Glucose-Capillary: 98 mg/dL (ref 70–99)

## 2013-05-11 LAB — PROTIME-INR
INR: 2.02 — AB (ref 0.00–1.49)
PROTHROMBIN TIME: 22.2 s — AB (ref 11.6–15.2)

## 2013-05-11 MED ORDER — WARFARIN SODIUM 5 MG PO TABS
5.0000 mg | ORAL_TABLET | Freq: Once | ORAL | Status: DC
  Filled 2013-05-11: qty 1

## 2013-05-11 MED ORDER — HYDROCODONE-ACETAMINOPHEN 5-325 MG PO TABS: 1.0000 | ORAL_TABLET | ORAL | Status: DC | PRN

## 2013-05-11 MED ORDER — WARFARIN SODIUM 5 MG PO TABS: 5.0000 mg | ORAL_TABLET | Freq: Once | ORAL | Status: DC

## 2013-05-11 NOTE — Progress Notes (Signed)
ANTICOAGULATION CONSULT NOTE - Follow Up Consult  Pharmacy Consult for Heparin and coumadin Indication: DVT  No Known Allergies  Patient Measurements: Height: 5\' 4"  (162.6 cm) Weight: 170 lb 6.7 oz (77.3 kg) IBW/kg (Calculated) : 59.2   Vital Signs: Temp: 98.2 F (36.8 C) (03/26 0713) Temp src: Oral (03/26 0713) BP: 121/76 mmHg (03/26 0713) Pulse Rate: 72 (03/26 0713)  Labs:  Recent Labs  05/09/13 0630 05/10/13 0318 05/11/13 0432  HGB 10.5* 10.5* 10.3*  HCT 31.6* 31.8* 31.5*  PLT 216 231 229  LABPROT 23.1* 22.1* 22.2*  INR 2.12* 2.00* 2.02*  HEPARINUNFRC 0.68 0.26* 0.19*    Estimated Creatinine Clearance: 77 ml/min (by C-G formula based on Cr of 1.21).  Assessment: 38yo male on heparin and coumadin for DVT, s/p  unsuccessful RLE TNKase infusion 3/19-3/20 and placement of IVC filter 3/19. Heparin level is subtherapeutic at 0.19 despite rate increase. CBC is stable and now bleeding noted. INR remains at goal at 2.02. Today is D#5/5 of overlap therapy.   In the past his coumadin dose was 25 mg/week = 3.5 mg/day= 2.5 mg TTSS and 5 MWF equivalent.   Goal of Therapy:  Heparin level 0.3-0.7 units/ml Monitor platelets by anticoagulation protocol: Yes INR 2-3   Plan:  1. Increase heparin gtt to 1350 units/hr - consider DC since today is D#5/5 overlap 2. Check an 8 hour HL if not dc'd 3. Warfarin 5mg  PO x 1 tonight 4. F/u AM INR  Lysle Pearlachel Chellsea Beckers, PharmD, BCPS Pager # [phone] 05/11/2013 7:45 AM

## 2013-05-11 NOTE — Discharge Summary (Signed)
Physician Discharge Summary  Tom Dalton ZOX:[id number] DOB: 1975-02-22 DOA: 05/03/2013  PCP: Lorretta Harp, MD  Admit date: 05/03/2013 Discharge date: 05/11/2013  Time spent: 35 minutes  Recommendations for Outpatient Follow-up:  1. Follow up with IR in 1 month 2. Follow up with Oncology in 2-3 weeks. 3. Follow up in 4 days on 3/31 at 11 am in Coumadin clinic in Hematology clinic   Recommendations for primary care physician for things to follow:  INR  Discharge Diagnoses:  Principal Problem:   Right leg DVT Active Problems:   Leukocytosis  Discharge Condition: stable  Diet recommendation: regular  Filed Weights   05/09/13 0656 05/10/13 0434 05/11/13 0610  Weight: 75.7 kg (166 lb 14.2 oz) 76.1 kg (167 lb 12.3 oz) 77.3 kg (170 lb 6.7 oz)   History of present illness:  38 year old male with past medical history significant for left lower extremity DVT dates few years back, was on anticoagulation by stopped per his primary care provider. Patient does not have a PCP at this time. Patient reported that for past one month he has experienced a pain and swelling in the right lower extremity which with comment bowel but at this time swelling and pain persisted. Patient was working out at the chin and he hit his leg and suddenly felt a shooting pain from the foot to the groin area and felt as if something is moving inside the leg. Swelling was started and it persisted. He was accompanied with redness and pain. In ED, vital signs are stable. Based on a lower extremity Doppler study patient was found to have extensive acute DVT of the right lower extremity involving the posterior tibial, peroneal, popliteal, profunda, femoral, and common femoral veins. Also noted is a superficial thrombus involving the greater saphenous vein mid to proximal and into the saphenofemoral junction. There is no propagation to the left side. Vascular surgery consulted by ED physician and recommendation was for  interventional radiology consult for direct thrombolysis. Request was made to transfer the patient to [facility]. [facility] to admit.  Hospital Course:  Right leg DVT - was on heparin drip>> dc'ed, 3/19 AM and resumed 3/19 PM and pt had direct thrombolysis per IR with Tenecteplase drip till repeat arteriogram is done. Also had IVC filter placed. Appreciated IR assistance. Discussed with Dr Bonnielee Haff and as per IR note lysis was unsuccessful and stopped as repeat arteriogram 3/20 only with little clot lysis. Hematology was consulted and Dr. Welton Flakes recommended treatment with heparin/Coumadin and patient was treated in house until his INR was therapeutic. This is his second episode of DVT, he has been on Coumadin in the past, and patient underwent hypercoagulable workup, anticardiolipin antibodies are low and Dr. Welton Flakes agrees that he will need life long anticoagulation on d/c. I've established care for [name] in hematology Coumadin clinic where he will be seen early next week for INR check and will have a followup appointment with hematology. Per IR pt will need to follow up in 1mos with IR for IVC removal and in 3mos again with IR. Continue thigh high Ted hose, and he will need to continue them on d/c as per IR recs  Leukocytosis - Likely reactive secondary to extensive DVT No obvious source of infection, UA neg. Resolved.  Procedures: S/P Rt lower extremity venogram with thrombolysis and  Placement of Inferior Vena Cava filter- Per Dr Bonnielee Haff 3/19   Consultations:  IR  Vascular  Hematology  Discharge Exam: Filed Vitals:   05/10/13 1359  05/10/13 2231 05/11/13 0610 05/11/13 0713  BP: 113/52 107/51  121/76  Pulse: 63 68  72  Temp: 97.5 F (36.4 C) 98 F (36.7 C)  98.2 F (36.8 C)  TempSrc: Oral Oral  Oral  Resp: 18 18  18   Height:      Weight:   77.3 kg (170 lb 6.7 oz)   SpO2: 100% 98%  96%   General: NAD Cardiovascular: RRR Respiratory: CTA biL  Discharge Instructions  Discharge  Orders   Future Appointments Provider Department Dept Phone   05/16/2013 11:00 AM [facility] Medical Oncology [phone]   Future Orders Complete By Expires   IR Radiologist Eval & Mgmt  08/05/2013 07/06/2014   Questions:     Preferred Imaging Location?:  [facility]   Reason for Exam (SYMPTOM  OR DIAGNOSIS REQUIRED):  3 mo follow up with Dr Bonnielee Haff or Reunion       Medication List         acetaminophen 325 MG tablet  Commonly known as:  TYLENOL  Take 650 mg by mouth every 6 (six) hours as needed for mild pain (leg pain).     HYDROcodone-acetaminophen 5-325 MG per tablet  Commonly known as:  NORCO/VICODIN  Take 1-2 tablets by mouth every 4 (four) hours as needed for moderate pain.     WAL-DRYL ALLERGY PO  Take 1 tablet by mouth daily.     warfarin 5 MG tablet  Commonly known as:  COUMADIN  Take 1 tablet (5 mg total) by mouth one time only at 6 PM.           Follow-up Information   Follow up with Lorretta Harp, MD. Schedule an appointment as soon as possible for a visit in 2 weeks.   Specialty:  Internal Medicine   Contact information:   [address] [place] [address] [phone]       Follow up with [facility], MD. Schedule an appointment as soon as possible for a visit in 1 month.   Specialty:  Interventional Radiology   Contact information:   [address] [place] [address] [phone]       Follow up with Drue Second, MD. Schedule an appointment as soon as possible for a visit in 1 week.   Specialty:  Oncology   Contact information:   [address] [place] [address] [phone]       Follow up with Coumadin clinic 3/31 at 11 am in the hematology clinic at [facility]. [phone].      The results of significant diagnostics from this hospitalization (including imaging, microbiology, ancillary and laboratory) are listed below for  reference.    Significant Diagnostic Studies: Ir Veno/ext/uni Right  05/04/2013   CLINICAL DATA:  Right lower extremity DVT  EXAM: IR INFUSION THROMBOL VENOUS INITIAL (MS); IR ULTRASOUND GUIDANCE VASC ACCESS RIGHT; RIGHT EXTREMITY VENOGRAPHY; INFERIOR VENA CAVOGRAM  FLUOROSCOPY TIME:  6 min and 6 seconds.  MEDICATIONS AND MEDICAL HISTORY: Versed 1.5 mg, Fentanyl 75 mcg.  Additional Medications: None.  ANESTHESIA/SEDATION: Moderate sedation time: 25 minutes  CONTRAST:  30 cc Omnipaque 300  PROCEDURE: The procedure, risks, benefits, and alternatives were explained to the patient. Questions regarding the procedure were encouraged and answered. The patient understands and consents to the procedure.  The right popliteal fossa in the prone position was prepped with Betadine in a sterile fashion, and a sterile drape was applied  covering the operative field. A sterile gown and sterile gloves were used for the procedure.  Under sonographic guidance, a micropuncture needle was inserted into the thrombosis popliteal vein and removed over a 018 wire. This was up sized to a glidewire. A 7 French sheath was inserted over the glidewire into the popliteal vein. Right lower extremity venography was performed.  A Kumpe the catheter was advanced over the glidewire to the femoral vein. The catheter was slowly advanced over the wire with intermittent injections for venography, to the level of the IVC. IVC venography was performed.  The thrombus was noted to be somewhat hard preventing easy passage of the Kumpe the catheter. Angiojet and mechanical thrombectomy was therefore not performed today.  The Kumpe be was exchanged over a stiff glidewire for a 90 cm, 50 cm infusion length 5 French multi side hole catheter.  FINDINGS: Venography of the right lower extremity demonstrates massive DVT extending from the popliteal vein into the common iliac vein.  The final image demonstrates an infusion catheter extending from the right popliteal  vein to the IVC. IVC filter is in place.  COMPLICATIONS: None  IMPRESSION: Successful establishment of access for right lower extremity venous lysis. TNK at 0.5 milligrams/hour was instituted.   Electronically Signed   By: Maryclare Bean M.D.   On: 05/04/2013 14:00   Ir Venocavagram Ivc  05/04/2013   CLINICAL DATA:  Right lower extremity DVT  EXAM: IR INFUSION THROMBOL VENOUS INITIAL (MS); IR ULTRASOUND GUIDANCE VASC ACCESS RIGHT; RIGHT EXTREMITY VENOGRAPHY; INFERIOR VENA CAVOGRAM  FLUOROSCOPY TIME:  6 min and 6 seconds.  MEDICATIONS AND MEDICAL HISTORY: Versed 1.5 mg, Fentanyl 75 mcg.  Additional Medications: None.  ANESTHESIA/SEDATION: Moderate sedation time: 25 minutes  CONTRAST:  30 cc Omnipaque 300  PROCEDURE: The procedure, risks, benefits, and alternatives were explained to the patient. Questions regarding the procedure were encouraged and answered. The patient understands and consents to the procedure.  The right popliteal fossa in the prone position was prepped with Betadine in a sterile fashion, and a sterile drape was applied covering the operative field. A sterile gown and sterile gloves were used for the procedure.  Under sonographic guidance, a micropuncture needle was inserted into the thrombosis popliteal vein and removed over a 018 wire. This was up sized to a glidewire. A 7 French sheath was inserted over the glidewire into the popliteal vein. Right lower extremity venography was performed.  A Kumpe the catheter was advanced over the glidewire to the femoral vein. The catheter was slowly advanced over the wire with intermittent injections for venography, to the level of the IVC. IVC venography was performed.  The thrombus was noted to be somewhat hard preventing easy passage of the Kumpe the catheter. Angiojet and mechanical thrombectomy was therefore not performed today.  The Kumpe be was exchanged over a stiff glidewire for a 90 cm, 50 cm infusion length 5 French multi side hole catheter.   FINDINGS: Venography of the right lower extremity demonstrates massive DVT extending from the popliteal vein into the common iliac vein.  The final image demonstrates an infusion catheter extending from the right popliteal vein to the IVC. IVC filter is in place.  COMPLICATIONS: None  IMPRESSION: Successful establishment of access for right lower extremity venous lysis. TNK at 0.5 milligrams/hour was instituted.   Electronically Signed   By: Maryclare Bean M.D.   On: 05/04/2013 14:00   Ir Ivc Filter Plmt / S&i /img Guid/mod Sed  05/04/2013   CLINICAL  DATA:  Right lower extremity DVT  EXAM: IVC FILTER,INFERIOR VENA CAVOGRAM, FLUOROSCOPIC GUIDANCE FOR CENTRAL VENOUS CATHETER PLACED  FLUOROSCOPY TIME:  1 min and 54 seconds.  MEDICATIONS AND MEDICAL HISTORY: Versed 1.5 mg, Fentanyl 75 mcg.  Additional Medications: None.  ANESTHESIA/SEDATION: Moderate sedation time: 20 minutes  CONTRAST:  40 cc Omnipaque 300  PROCEDURE: The procedure, risks, benefits, and alternatives were explained to the patient. Questions regarding the procedure were encouraged and answered. The patient understands and consents to the procedure.  The right neck was prepped with Betadine in a sterile fashion, and a sterile drape was applied covering the operative field. A sterile gown and sterile gloves were used for the procedure.  The right internal jugular vein was noted to be patent initially with ultrasound. Under sonographic guidance, a micropuncture needle was inserted into the right internal jugular vein (Ultrasound image documentation was performed). It was removed over an 018 wire which was upsized to a Mathews. The sheath was inserted over the wire and into the IVC. IVC venography was performed.  The temporary filter was then deployed in the infrarenal IVC.  The sheath was exchanged over a Bentson wire for a temporary dialysis catheter.  FINDINGS: IVC venography demonstrates renal vein inflow at L1 and no venous anomaly. No IVC thrombus.  The  image demonstrates placement of an IVC filter with its tip at the L1 inferior end-plate.  Final image demonstrates a right internal jugular temporary dialysis catheter with its tip at the cavoatrial junction.  COMPLICATIONS: None  IMPRESSION: Successful infrarenal IVC filter placement. This is a temporary filter. It can be removed or remain in place to become permanent.  A right internal jugular temporary dialysis catheter was exchanged for venous access after IVC filter placement. This will prevent a bleeding complication at the internal jugular access site in preparation for venous lysis.   Electronically Signed   By: Maryclare Bean M.D.   On: 05/04/2013 13:48   Ir Fluoro Guide Cv Line Right  05/04/2013   CLINICAL DATA:  Right lower extremity DVT  EXAM: IVC FILTER,INFERIOR VENA CAVOGRAM, FLUOROSCOPIC GUIDANCE FOR CENTRAL VENOUS CATHETER PLACED  FLUOROSCOPY TIME:  1 min and 54 seconds.  MEDICATIONS AND MEDICAL HISTORY: Versed 1.5 mg, Fentanyl 75 mcg.  Additional Medications: None.  ANESTHESIA/SEDATION: Moderate sedation time: 20 minutes  CONTRAST:  40 cc Omnipaque 300  PROCEDURE: The procedure, risks, benefits, and alternatives were explained to the patient. Questions regarding the procedure were encouraged and answered. The patient understands and consents to the procedure.  The right neck was prepped with Betadine in a sterile fashion, and a sterile drape was applied covering the operative field. A sterile gown and sterile gloves were used for the procedure.  The right internal jugular vein was noted to be patent initially with ultrasound. Under sonographic guidance, a micropuncture needle was inserted into the right internal jugular vein (Ultrasound image documentation was performed). It was removed over an 018 wire which was upsized to a [place]. The sheath was inserted over the wire and into the IVC. IVC venography was performed.  The temporary filter was then deployed in the infrarenal IVC.  The sheath was  exchanged over a Bentson wire for a temporary dialysis catheter.  FINDINGS: IVC venography demonstrates renal vein inflow at L1 and no venous anomaly. No IVC thrombus.  The image demonstrates placement of an IVC filter with its tip at the L1 inferior end-plate.  Final image demonstrates a right internal jugular temporary dialysis catheter with its  tip at the cavoatrial junction.  COMPLICATIONS: None  IMPRESSION: Successful infrarenal IVC filter placement. This is a temporary filter. It can be removed or remain in place to become permanent.  A right internal jugular temporary dialysis catheter was exchanged for venous access after IVC filter placement. This will prevent a bleeding complication at the internal jugular access site in preparation for venous lysis.   Electronically Signed   By: Maryclare Bean M.D.   On: 05/04/2013 13:48   Ir US Guide Vasc Access Right  05/04/2013   CLINICAL DATA:  Right lower extremity DVT  EXAM: IR INFUSION THROMBOL VENOUS INITIAL (MS); IR ULTRASOUND GUIDANCE VASC ACCESS RIGHT; RIGHT EXTREMITY VENOGRAPHY; INFERIOR VENA CAVOGRAM  FLUOROSCOPY TIME:  6 min and 6 seconds.  MEDICATIONS AND MEDICAL HISTORY: Versed 1.5 mg, Fentanyl 75 mcg.  Additional Medications: None.  ANESTHESIA/SEDATION: Moderate sedation time: 25 minutes  CONTRAST:  30 cc Omnipaque 300  PROCEDURE: The procedure, risks, benefits, and alternatives were explained to the patient. Questions regarding the procedure were encouraged and answered. The patient understands and consents to the procedure.  The right popliteal fossa in the prone position was prepped with Betadine in a sterile fashion, and a sterile drape was applied covering the operative field. A sterile gown and sterile gloves were used for the procedure.  Under sonographic guidance, a micropuncture needle was inserted into the thrombosis popliteal vein and removed over a 018 wire. This was up sized to a glidewire. A 7 French sheath was inserted over the glidewire into  the popliteal vein. Right lower extremity venography was performed.  A Kumpe the catheter was advanced over the glidewire to the femoral vein. The catheter was slowly advanced over the wire with intermittent injections for venography, to the level of the IVC. IVC venography was performed.  The thrombus was noted to be somewhat hard preventing easy passage of the Kumpe the catheter. Angiojet and mechanical thrombectomy was therefore not performed today.  The Kumpe be was exchanged over a stiff glidewire for a 90 cm, 50 cm infusion length 5 French multi side hole catheter.  FINDINGS: Venography of the right lower extremity demonstrates massive DVT extending from the popliteal vein into the common iliac vein.  The final image demonstrates an infusion catheter extending from the right popliteal vein to the IVC. IVC filter is in place.  COMPLICATIONS: None  IMPRESSION: Successful establishment of access for right lower extremity venous lysis. TNK at 0.5 milligrams/hour was instituted.   Electronically Signed   By: Maryclare Bean M.D.   On: 05/04/2013 14:00   Ct Angio Abd/pel W/ And/or W/o  05/03/2013   CLINICAL DATA:  Right lower extremity DVT. History of bilateral pulmonary embolism.  EXAM: CT ANGIOGRAPHY ABDOMEN AND PELVIS WITH CONTRAST AND WITHOUT CONTRAST  TECHNIQUE: Multidetector CT imaging of the abdomen and pelvis was performed using the standard protocol during bolus administration of intravenous contrast. Multiplanar reconstructed images and MIPs were obtained and reviewed to evaluate the vascular anatomy.  CONTRAST:  OMNIPAQUE IOHEXOL 300 MG/ML  SOLN  COMPARISON:  CT ANGIO CHEST W/CM &/OR WO/CM dated 12/21/2010  FINDINGS: Beginning in the superior segment of the external iliac vein, thrombus is visualized in the right external iliac vein and right common femoral vein extending into the thigh. The common femoral vein is very distended with clot. Thrombus nearly extends into the right common iliac vein. No  thrombus is identified in the IVC.  No mass-effect is identified on venous structures. There is no evidence  of adenopathy. No left-sided femoral or iliac DVT is identified. The abdominal aorta is normally patent.  Visualized lung bases show a small right pleural effusion. The liver, gallbladder, pancreas, spleen, adrenal glands and kidneys are unremarkable. There is a simple cyst of the anterior left kidney measuring approximately 2 cm. No abnormal fluid collections are identified. The bladder is unremarkable. No bony abnormalities are seen.  Review of the MIP images confirms the above findings.  IMPRESSION: Right-sided iliofemoral DVT seen throughout the visualized common femoral and external iliac veins. There is no evidence of IVC thrombus or extrinsic mass effect on venous structures.   Electronically Signed   By: Irish Lack M.D.   On: 05/03/2013 17:07   Ir Infusion Thrombol Venous Initial (ms)  05/04/2013   CLINICAL DATA:  Right lower extremity DVT  EXAM: IR INFUSION THROMBOL VENOUS INITIAL (MS); IR ULTRASOUND GUIDANCE VASC ACCESS RIGHT; RIGHT EXTREMITY VENOGRAPHY; INFERIOR VENA CAVOGRAM  FLUOROSCOPY TIME:  6 min and 6 seconds.  MEDICATIONS AND MEDICAL HISTORY: Versed 1.5 mg, Fentanyl 75 mcg.  Additional Medications: None.  ANESTHESIA/SEDATION: Moderate sedation time: 25 minutes  CONTRAST:  30 cc Omnipaque 300  PROCEDURE: The procedure, risks, benefits, and alternatives were explained to the patient. Questions regarding the procedure were encouraged and answered. The patient understands and consents to the procedure.  The right popliteal fossa in the prone position was prepped with Betadine in a sterile fashion, and a sterile drape was applied covering the operative field. A sterile gown and sterile gloves were used for the procedure.  Under sonographic guidance, a micropuncture needle was inserted into the thrombosis popliteal vein and removed over a 018 wire. This was up sized to a glidewire. A 7  French sheath was inserted over the glidewire into the popliteal vein. Right lower extremity venography was performed.  A Kumpe the catheter was advanced over the glidewire to the femoral vein. The catheter was slowly advanced over the wire with intermittent injections for venography, to the level of the IVC. IVC venography was performed.  The thrombus was noted to be somewhat hard preventing easy passage of the Kumpe the catheter. Angiojet and mechanical thrombectomy was therefore not performed today.  The Kumpe be was exchanged over a stiff glidewire for a 90 cm, 50 cm infusion length 5 French multi side hole catheter.  FINDINGS: Venography of the right lower extremity demonstrates massive DVT extending from the popliteal vein into the common iliac vein.  The final image demonstrates an infusion catheter extending from the right popliteal vein to the IVC. IVC filter is in place.  COMPLICATIONS: None  IMPRESSION: Successful establishment of access for right lower extremity venous lysis. TNK at 0.5 milligrams/hour was instituted.   Electronically Signed   By: Maryclare Bean M.D.   On: 05/04/2013 14:00   Ir Rande Lawman F/u Eval Art/ven Basil Dess Day (ms)  05/05/2013   CLINICAL DATA:  Right lower extremity DVT and status post placement of IVC filter with initiation of transcatheter thrombolytics therapy for ileofemoral DVT yesterday. Fibrinogen has fallen during thrombus therapy to 100 this morning. There has also been persistent oozing around the right jugular central venous catheter.  EXAM: FOLLOWUP ANGIOGRAPHY OF THE RIGHT LOWER EXTREMITY DURING VENOUS FROM THE LIVER THERAPY.  ANESTHESIA/SEDATION: No conscious sedation was administered.  CONTRAST:  30 mL Omnipaque 300  FLUOROSCOPY TIME:  48 seconds.  PROCEDURE: The procedure risks, benefits, and alternatives were explained to the patient. Questions regarding the procedure were encouraged and answered. The patient understands and  consents to the procedure.  The pre-existing  thrombolytic infusion catheter was injected with contrast material and venography performed of the right lower extremity. Thrombolytic infusion was discontinued and the catheter connected to normal saline at a rate of 50 mL/hour. IV heparin was also held for 2 hr.  The right jugular dialysis catheter was then removed after sterile prep with chlorhexidine. A 2-0 Ethilon pursestring suture was applied at the catheter exit site. A sterile dressing was applied.  COMPLICATIONS: None  FINDINGS: The right femoral vein in the thigh, common femoral vein and iliac veins show mildly improved patency since the venogram yesterday. There remains a large burden of chronic appearing thrombus. Due to significant decrease in fibrinogen, thrombolytic infusion will be held for a few hours as a new fibrinogen level was checked. If there is a rise in fibrinogen, tenecteplase infusion will be restarted at a lower rate.  IMPRESSION: Follow-up angiography shows mildly improved venous patency during thrombolytic therapy. A significant amount of chronic appearing thrombus remains. Due to drop in fibrinogen, thrombolytic therapy will be held for a few hours and another fibrinogen level checked.   Electronically Signed   By: Irish LackGlenn  Yamagata M.D.   On: 05/05/2013 13:48    Microbiology: Recent Results (from the past 240 hour(s))  MRSA PCR SCREENING     Status: None   Collection Time    05/04/13  1:04 PM      Result Value Ref Range Status   MRSA by PCR NEGATIVE  NEGATIVE Final   Comment:            The GeneXpert MRSA Assay (FDA     approved for NASAL specimens     only), is one component of a     comprehensive MRSA colonization     surveillance program. It is not     intended to diagnose MRSA     infection nor to guide or     monitor treatment for     MRSA infections.     Labs: Basic Metabolic Panel:  Recent Labs Lab 05/08/13 0540  NA 141  K 4.0  CL 102  CO2 27  GLUCOSE 94  BUN 12  CREATININE 1.21  CALCIUM 9.1    CBC:  Recent Labs Lab 05/07/13 0328 05/08/13 0540 05/09/13 0630 05/10/13 0318 05/11/13 0432  WBC 5.5 5.7 5.4 7.3 7.8  HGB 10.1* 10.5* 10.5* 10.5* 10.3*  HCT 30.2* 30.9* 31.6* 31.8* 31.5*  MCV 71.2* 71.5* 71.8* 72.3* 72.6*  PLT 168 194 216 231 229   CBG:  Recent Labs Lab 05/05/13 0807 05/06/13 0745 05/07/13 0754 05/10/13 0604 05/11/13 0607  GLUCAP 105* 107* 96 88 98    Signed:  Milanna Kozlov  [facility] 05/11/2013, 2:43 PM

## 2013-05-15 ENCOUNTER — Telehealth: Payer: Self-pay | Admitting: Hematology and Oncology

## 2013-05-15 NOTE — Telephone Encounter (Signed)
LEFT MESSAGE FOR PATIENT AND GAVE NEW PATIENT APPT FOR 04/01 @ 10:45 W/DR. GORSUCH

## 2013-05-15 NOTE — Telephone Encounter (Signed)
C/D 05/15/13 for appt. 05/17/13 °

## 2013-05-16 ENCOUNTER — Ambulatory Visit: Payer: 59

## 2013-05-17 ENCOUNTER — Encounter: Payer: Self-pay | Admitting: Hematology and Oncology

## 2013-05-17 ENCOUNTER — Ambulatory Visit (HOSPITAL_BASED_OUTPATIENT_CLINIC_OR_DEPARTMENT_OTHER): Payer: 59 | Admitting: Hematology and Oncology

## 2013-05-17 ENCOUNTER — Ambulatory Visit: Payer: 59 | Admitting: Pharmacist

## 2013-05-17 ENCOUNTER — Telehealth: Payer: Self-pay | Admitting: Hematology and Oncology

## 2013-05-17 ENCOUNTER — Ambulatory Visit: Payer: 59

## 2013-05-17 ENCOUNTER — Ambulatory Visit (HOSPITAL_BASED_OUTPATIENT_CLINIC_OR_DEPARTMENT_OTHER): Payer: 59

## 2013-05-17 VITALS — BP 133/67 | HR 66 | Temp 98.4°F | Resp 20 | Ht 64.0 in | Wt 173.0 lb

## 2013-05-17 DIAGNOSIS — I2699 Other pulmonary embolism without acute cor pulmonale: Secondary | ICD-10-CM

## 2013-05-17 DIAGNOSIS — D539 Nutritional anemia, unspecified: Secondary | ICD-10-CM

## 2013-05-17 DIAGNOSIS — I82409 Acute embolism and thrombosis of unspecified deep veins of unspecified lower extremity: Secondary | ICD-10-CM

## 2013-05-17 DIAGNOSIS — M79606 Pain in leg, unspecified: Secondary | ICD-10-CM

## 2013-05-17 DIAGNOSIS — D649 Anemia, unspecified: Secondary | ICD-10-CM

## 2013-05-17 DIAGNOSIS — I82401 Acute embolism and thrombosis of unspecified deep veins of right lower extremity: Secondary | ICD-10-CM

## 2013-05-17 HISTORY — DX: Nutritional anemia, unspecified: D53.9

## 2013-05-17 HISTORY — DX: Pain in leg, unspecified: M79.606

## 2013-05-17 LAB — COMPREHENSIVE METABOLIC PANEL (CC13)
ALK PHOS: 94 U/L (ref 40–150)
ALT: 45 U/L (ref 0–55)
AST: 24 U/L (ref 5–34)
Albumin: 4.4 g/dL (ref 3.5–5.0)
Anion Gap: 12 mEq/L — ABNORMAL HIGH (ref 3–11)
BUN: 20.6 mg/dL (ref 7.0–26.0)
CO2: 25 mEq/L (ref 22–29)
CREATININE: 1.2 mg/dL (ref 0.7–1.3)
Calcium: 10 mg/dL (ref 8.4–10.4)
Chloride: 104 mEq/L (ref 98–109)
Glucose: 78 mg/dl (ref 70–140)
Potassium: 4.1 mEq/L (ref 3.5–5.1)
Sodium: 140 mEq/L (ref 136–145)
Total Bilirubin: 1.07 mg/dL (ref 0.20–1.20)
Total Protein: 8.8 g/dL — ABNORMAL HIGH (ref 6.4–8.3)

## 2013-05-17 LAB — IRON AND TIBC CHCC
%SAT: 15 % — ABNORMAL LOW (ref 20–55)
IRON: 54 ug/dL (ref 42–163)
TIBC: 371 ug/dL (ref 202–409)
UIBC: 317 ug/dL (ref 117–376)

## 2013-05-17 LAB — CBC & DIFF AND RETIC
BASO%: 0.6 % (ref 0.0–2.0)
Basophils Absolute: 0 10*3/uL (ref 0.0–0.1)
EOS%: 4.1 % (ref 0.0–7.0)
Eosinophils Absolute: 0.3 10*3/uL (ref 0.0–0.5)
HCT: 35.4 % — ABNORMAL LOW (ref 38.4–49.9)
HEMOGLOBIN: 11.5 g/dL — AB (ref 13.0–17.1)
IMMATURE RETIC FRACT: 14.2 % — AB (ref 3.00–10.60)
LYMPH#: 2.2 10*3/uL (ref 0.9–3.3)
LYMPH%: 32.3 % (ref 14.0–49.0)
MCH: 23.5 pg — AB (ref 27.2–33.4)
MCHC: 32.5 g/dL (ref 32.0–36.0)
MCV: 72.2 fL — AB (ref 79.3–98.0)
MONO#: 0.4 10*3/uL (ref 0.1–0.9)
MONO%: 5.3 % (ref 0.0–14.0)
NEUT#: 3.9 10*3/uL (ref 1.5–6.5)
NEUT%: 57.7 % (ref 39.0–75.0)
Platelets: 319 10*3/uL (ref 140–400)
RBC: 4.9 10*6/uL (ref 4.20–5.82)
RDW: 14.8 % — ABNORMAL HIGH (ref 11.0–14.6)
RETIC %: 4.02 % — AB (ref 0.80–1.80)
RETIC CT ABS: 196.98 10*3/uL — AB (ref 34.80–93.90)
WBC: 6.8 10*3/uL (ref 4.0–10.3)

## 2013-05-17 LAB — CHCC SMEAR

## 2013-05-17 LAB — VITAMIN B12: Vitamin B-12: 612 pg/mL (ref 211–911)

## 2013-05-17 LAB — POCT INR: INR: 3.21

## 2013-05-17 LAB — PROTIME-INR

## 2013-05-17 LAB — FERRITIN CHCC: Ferritin: 493 ng/ml — ABNORMAL HIGH (ref 22–316)

## 2013-05-17 LAB — PROTHROMBIN TIME
INR: 3.21 — AB (ref <1.50)
PROTHROMBIN TIME: 31.7 s — AB (ref 11.6–15.2)

## 2013-05-17 MED ORDER — HYDROCODONE-ACETAMINOPHEN 5-325 MG PO TABS: 1.0000 | ORAL_TABLET | Freq: Three times a day (TID) | ORAL | Status: DC | PRN

## 2013-05-17 NOTE — Progress Notes (Signed)
Checked in new patient with no financial issues.  °

## 2013-05-17 NOTE — Telephone Encounter (Signed)
gv adn printed aptp sched and avs for pt for April....sed pt to lab

## 2013-05-17 NOTE — Progress Notes (Signed)
[facility] CONSULT NOTE  Patient Care Team: Lorretta Harp, MD as PCP - General (Internal Medicine)  CHIEF COMPLAINTS/PURPOSE OF CONSULTATION:  Recurrent DVT  HISTORY OF PRESENTING ILLNESS:  Tom Dalton 38 y.o. male is here because of recent diagnosis of recurrent DVT.  In 2012, the patient had trauma to the left lower extremity whereby he hit his left Achilles tendon. Subsequently, he developed acute swelling in the left lower extremity. Ultrasound venous Doppler of bilateral lower extremity on 12/21/2010 showed acute deep vein thrombosis involving the posterior tibial, popliteal, and distal and mid femoral veins of the left lower extremity. There is sluggish flow noted throughout the right lower extremity. CT angiogram perform on 12/21/2010 showed bilateral pulmonary emboli within lobar and segmental branches. He had echocardiogram performed which show no evidence of right ventricular strain. The patient was anticoagulated with warfarin for about a year. He denies any bleeding complications while on warfarin. His anticoagulation therapy was subsequently stopped.  Recently, he complained of an acute onset of right lower extremity swelling and pain. He had been present for several weeks prior to his presentation to the emergency Department. The patient have repeat ultrasound venous Doppler on 05/03/2013 which showed acute deep vein thrombosis involving the posterior tibial, peroneal, popliteal, profunda, femoral , and common femoral veins of the right lower extremity. Diffuse minute flow only noted in the posterior tibial and popliteal veins. There was also evidence of superficial thrombus of the greater saphenous vein from mid thigh to the saphenofemoral junction and including the junction. Due to extensive clots, thrombolytic therapy was performed but it was not helpful to reduce the clot burden.   On 05/03/2013, CT angiogram of the abdomen and pelvis showed from the beginning in the  superior segment of the external iliac vein, thrombus is visualized in the right external iliac vein and right common femoral vein extending into the thigh. The common femoral vein is very distended with clot. Thrombus nearly extends into the right common iliac vein. No thrombus is identified in the IVC. At that point in time, interventional radiologist was consulted and an IVC filter was deployed via the right jugular vein. The reason for placement of IVC filter was unknown but presumably to protect his pulmonary vasculature due to extensive clot burden and prior history of extensive bilateral PE.   At present time, he complained of persistent right lower extremity swelling, warmth and  tenderness  without presence of erythema.  He denies recent chest pain on exertion, shortness of breath on minimal exertion, pre-syncopal episodes, hemoptysis, or palpitation. The stitches are still present on his right lower neck from recent surgery.  In terms of risk factors, He denies recent history of trauma, long distance travel, dehydration, recent surgery, smoking or prolonged immobilization. He had no prior history or diagnosis of cancer. His age appropriate screening programs are up-to-date. The patient had never been testosterone replacement therapy. There is family history of blood clots  in his father.   The patient had screening test for thrombophilia disorder performed while he was hospitalized. He tested negative for protein S deficiency, antithrombin III deficiency, lupus anticoagulant, high homocysteine level, factor V Leiden mutation or prothrombin gene mutation.  The protein C activity and total protein C level were reduced but this not unexpected in the setting of recent clot.   The patient was anticoagulated with heparin, overlap with warfarin and subsequently dismissed with followup here. The patient does not have primary care provider. Of note, the patient was noted  to be anemic after his  thrombolytic therapy.The patient denies any recent signs or symptoms of bleeding such as spontaneous epistaxis, hematuria or hematochezia. He denies any symptoms of anemia such as shortness of breath, chest pain or dizziness.  MEDICAL HISTORY:  Past Medical History  Diagnosis Date  . Tobacco abuse   . ETOH abuse   . Headache(784.0)     when the weather changes, advil  . Pneumonia     5 years ago  . Acute renal failure 12/24/2010  . Blood clot in vein   . 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 Leg pain 05/17/2013  . Unspecified deficiency anemia 05/17/2013    SURGICAL HISTORY: History reviewed. No pertinent past surgical history.  SOCIAL HISTORY: History   Social History  . Marital Status: Single    Spouse Name: N/A    Number of Children: 3  . Years of Education: N/A   Occupational History  . Fingernail engineer    Social History Main Topics  . Smoking status: Former Smoker -- 0.10 packs/day for 10 years  . Smokeless tobacco: Never Used  . Alcohol Use: 9.0 oz/week    15 Cans of beer per week     Comment: Drinks 8-10 beers on Saturday and Sundays  . Drug Use: No     Comment: Denies  . Sexual Activity: No   Other Topics Concern  . Not on file   Social History Narrative  . No narrative on file    FAMILY HISTORY: Family History  Problem Relation Age of Onset  . Stroke Father     no premature CAD  . Clotting disorder Father     ?blood clot  . Healthy Mother     no premature CAD    ALLERGIES:  has No Known Allergies.  MEDICATIONS:  Current Outpatient Prescriptions  Medication Sig Dispense Refill  . acetaminophen (TYLENOL) 325 MG tablet Take 650 mg by mouth every 6 (six) hours as needed for mild pain (leg pain).      . DiphenhydrAMINE HCl (WAL-DRYL ALLERGY PO) Take 1 tablet by mouth daily.      Marland Kitchen HYDROcodone-acetaminophen (NORCO/VICODIN) 5-325 MG per tablet Take 1 tablet by mouth 3 (three) times daily as needed for moderate pain.  60 tablet  0  . warfarin (COUMADIN) 5 MG  tablet Take 5 mg by mouth daily at 6 PM.       No current facility-administered medications for this visit.    REVIEW OF SYSTEMS:   Constitutional: Denies fevers, chills or abnormal night sweats Eyes: Denies blurriness of vision, double vision or watery eyes Ears, nose, mouth, throat, and face: Denies mucositis or sore throat Respiratory: Denies cough, dyspnea or wheezes Cardiovascular: Denies palpitation, chest discomfort or lower extremity swelling Gastrointestinal:  Denies nausea, heartburn or change in bowel habits Skin: Denies abnormal skin rashes Lymphatics: Denies new lymphadenopathy or easy bruising Neurological:Denies numbness, tingling or new weaknesses Behavioral/Psych: Mood is stable, no new changes  All other systems were reviewed with the patient and are negative.  PHYSICAL EXAMINATION: ECOG PERFORMANCE STATUS: 1 - Symptomatic but completely ambulatory  Filed Vitals:   05/17/13 1130  BP: 133/67  Pulse: 66  Temp: 98.4 F (36.9 C)  Resp: 20   Filed Weights   05/17/13 1130  Weight: 173 lb (78.472 kg)    GENERAL:alert, no distress and comfortable SKIN: skin color, texture, turgor are normal, no rashes or significant lesions EYES: normal, conjunctiva are pink and non-injected, sclera clear OROPHARYNX:no exudate, no erythema and  lips, buccal mucosa, and tongue normal  NECK: supple, thyroid normal size, non-tender, without nodularity LYMPH:  no palpable lymphadenopathy in the cervical, axillary or inguinal LUNGS: clear to auscultation and percussion with normal breathing effort HEART: regular rate & rhythm and no murmurs with moderate right lower extremity edema ABDOMEN:abdomen soft, non-tender and normal bowel sounds Musculoskeletal:no cyanosis of digits and no clubbing . Stitches are still present on the right lower part of his neck.  PSYCH: alert & oriented x 3 with fluent speech NEURO: no focal motor/sensory deficits  LABORATORY DATA:  I have reviewed the  data as listed Lab Results  Component Value Date   WBC 6.8 05/17/2013   HGB 11.5* 05/17/2013   HCT 35.4* 05/17/2013   MCV 72.2* 05/17/2013   PLT 319 05/17/2013    I have also review all the records pertaining to his lab work as above. The patient was also noted to have brief episode of acute renal failure from prior hospitalization.   RADIOGRAPHIC STUDIES: I have personally reviewed the radiological images as listed and agreed with the findings in the report. Ct Angio Abd/pel W/ And/or W/o  05/03/2013   CLINICAL DATA:  Right lower extremity DVT. History of bilateral pulmonary embolism.  EXAM: CT ANGIOGRAPHY ABDOMEN AND PELVIS WITH CONTRAST AND WITHOUT CONTRAST  TECHNIQUE: Multidetector CT imaging of the abdomen and pelvis was performed using the standard protocol during bolus administration of intravenous contrast. Multiplanar reconstructed images and MIPs were obtained and reviewed to evaluate the vascular anatomy.  CONTRAST:  100mL OMNIPAQUE IOHEXOL 300 MG/ML  SOLN  COMPARISON:  CT ANGIO CHEST W/CM &/OR WO/CM dated 12/21/2010  FINDINGS: Beginning in the superior segment of the external iliac vein, thrombus is visualized in the right external iliac vein and right common femoral vein extending into the thigh. The common femoral vein is very distended with clot. Thrombus nearly extends into the right common iliac vein. No thrombus is identified in the IVC.  No mass-effect is identified on venous structures. There is no evidence of adenopathy. No left-sided femoral or iliac DVT is identified. The abdominal aorta is normally patent.  Visualized lung bases show a small right pleural effusion. The liver, gallbladder, pancreas, spleen, adrenal glands and kidneys are unremarkable. There is a simple cyst of the anterior left kidney measuring approximately 2 cm. No abnormal fluid collections are identified. The bladder is unremarkable. No bony abnormalities are seen.  Review of the MIP images confirms the above findings.   IMPRESSION: Right-sided iliofemoral DVT seen throughout the visualized common femoral and external iliac veins. There is no evidence of IVC thrombus or extrinsic mass effect on venous structures.   Electronically Signed   By: Irish LackGlenn  Yamagata M.D.   On: 05/03/2013 17:07   ASSESSMENT:  Recurrent DVT/PE  PLAN:  #1 recurrent low right lower extremity DVT, status post IVC filter placement #2 history of left lower extremity DVT with bilateral PE  I reviewed with the patient about the plan for care for recurrent DVT/PE.  This last episode of blood clot appeared to be unprovoked.  The first episode of DVT of the left lower extremity was provoked by trauma to the left lower extremity. 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 The patient has a positive family history of blood clot in his father. We discussed about the pros and cons about testing for thrombophilia disorder. his current anticoagulation therapy  will interfere with some the tests and it is not possible to interpret the test results.  Taking him off the anticoagulation therapy to do the tests may precipitate another thrombotic event. I do not see a reason to order excessive testing to screen for thrombophilia disorder as it would not change our management.  The goal of anticoagulation therapy is for life.   We discussed about various options of anticoagulation therapies including warfarin, low molecular weight heparin such as Lovenox or newer agents such as Rivaroxaban. Some of the risks and benefits discussed including costs involved, the need for monitoring, risks of life-threatening bleeding/hospitalization, reversibility of each agent in the event of bleeding or overdose, safety profile of each drug and taking into account other social issues such as ease of administration of medications, etc. Ultimately, we have made an informed decision for the patient  to continue his treatment with warfarin. The reason I chose warfarin is because of history of acute renal failure. I am also concerned about the new onset of anemia that he may have bleeding.  I recommend the patient to use elastic compression stockings at 20-30 mmHg to reduce risks of chronic thrombophlebitis.  Another main issue we discussed today included the role of screening other family members for thrombophilia disorder. At present time, I would not recommend testing the patient's family members as it would not benefit them.  Thrombophilia disorder is a genetic predisposition which increases an individual's risk for a thrombotic event, NOT a disease.  We discussed the implications of genetic screening including the possibility of uninsurability, costs involved, emotional distress and possible discrimination at various levels for the affected individual.  Rather than genetic screening, one can possibly benefit from genetic counseling or dissemination of appropriate reading materials to educate other family members.  Finally, at the end of our consultation today, I reinforced the importance of preventive strategies such as avoiding hormonal supplement, avoiding cigarette smoking, keeping up-to-date with screening programs for early cancer detection, frequent ambulation for long distance travel and aggressive DVT prophylaxis in all surgical settings.  The patient does not have any primary care provider. I will refer him to our Coumadin clinic here.   #3 microcytic anemia The anemia is new since his thrombolytic therapy. I will order an additional workup for this. I am concerned about potential bleeding as the patient is on anticoagulation therapy. Due to significant microcytosis seen, I'm also wondering whether the patient may have thalassemia. I will border appropriate workup for this.  #4 presence of stitches from recent IVC filter placement I would get my nursing staff to remove the stitches. I  will see him on a regular basis to monitor his blood count. I recommend repeating ultrasound venous of the lower extremity in several months and I will refer him back to interventional radiologist to have the IVC filter removal in the new future.  Orders Placed This Encounter  Procedures  . CBC & Diff and Retic    Standing Status: Future     Number of Occurrences: 1     Standing Expiration Date: 05/17/2014  . Smear    Standing Status: Future     Number of Occurrences: 1     Standing Expiration Date: 05/17/2014  . Vitamin B12    Standing Status: Future     Number of Occurrences: 1     Standing Expiration Date: 05/17/2014  . Ferritin    Standing Status: Future     Number of Occurrences: 1  Standing Expiration Date: 05/17/2014  . Iron and TIBC    Standing Status: Future     Number of Occurrences: 1     Standing Expiration Date: 05/17/2014  . Hemoglobinopathy evaluation    Standing Status: Future     Number of Occurrences:      Standing Expiration Date: 05/17/2014  . Comprehensive metabolic panel    Standing Status: Future     Number of Occurrences: 1     Standing Expiration Date: 05/17/2014  . CBC & Diff and Retic    Standing Status: Future     Number of Occurrences:      Standing Expiration Date: 05/17/2014  . Protime-INR    Standing Status: Standing     Number of Occurrences: 52     Standing Expiration Date: 05/18/2014  . Oncology referral to [facility]    Referral Priority:  Routine    Referral Type:  Consultation    Referral Reason:  Specialty Services Required    Number of Visits Requested:  1    All questions were answered. The patient knows to call the clinic with any problems, questions or concerns. I spent 55 minutes counseling the patient face to face. The total time spent in the appointment was 60 minutes and more than 50% was on counseling.     [facility], Clella Mckeel, MD 05/17/2013 9:55 PM

## 2013-05-17 NOTE — Progress Notes (Signed)
Pt arrived to clinic with suture in place at right neck s/p TLC removed in hospital 10 days ago. Suture at Right neck/clavicle removed by Awilda Metro, PA. using suture removal kit.  Pt tolerated well.  Site healed with incision intact, no bleeding, no signs of infection present.

## 2013-05-17 NOTE — Progress Notes (Signed)
Pt had labs done.  INR had to be sent out. Ginna, pharmacist will call pt with INR results and coumadin instructions this afternoon on his cell phone.  Instructed pt he may leave [facility] and will be called with coumadin instructions. Instructed not to take his coumadin today until he has instructions.  He verbalized understanding.

## 2013-05-17 NOTE — Progress Notes (Signed)
INR = 3.21 on Coumadin 5 mg daily New consult from Dr. Bertis Rud[facility]Gorsuch [facility]. Indication for tx: RLE DVT; s/p IVC filter 05/04/13; h/o unprovoked PE 12/2010.  Completed 1 year of anticoag 12/2011 ([facility]; at that time he took Coumadin 2.5 mg daily except 5 mg MWF) Risk factors for bleeding: anemia, EtOH abuse (drinks 9 oz/week; 8-10 beers on weekends) Duration: indefinite; until MD deems appropriate to discontinue. Goal INR = 2-3 INR supratherapeutic today.  Will change dose of Coumadin to 5 mg/day; 2.5 mg Wed/Fri. Return in 2 weeks. Tom HailGinna Sanuel Dalton, Pharm.D., CPP 05/17/2013@3 :25 PM

## 2013-05-18 ENCOUNTER — Encounter: Payer: Self-pay | Admitting: Hematology and Oncology

## 2013-05-29 ENCOUNTER — Ambulatory Visit (INDEPENDENT_AMBULATORY_CARE_PROVIDER_SITE_OTHER): Payer: 59 | Admitting: Internal Medicine

## 2013-05-29 ENCOUNTER — Encounter: Payer: Self-pay | Admitting: Internal Medicine

## 2013-05-29 VITALS — BP 108/63 | HR 64 | Temp 98.0°F | Ht 64.0 in | Wt 172.8 lb

## 2013-05-29 DIAGNOSIS — I82409 Acute embolism and thrombosis of unspecified deep veins of unspecified lower extremity: Secondary | ICD-10-CM

## 2013-05-29 DIAGNOSIS — Z13220 Encounter for screening for lipoid disorders: Secondary | ICD-10-CM

## 2013-05-29 DIAGNOSIS — R7309 Other abnormal glucose: Secondary | ICD-10-CM

## 2013-05-29 DIAGNOSIS — E663 Overweight: Secondary | ICD-10-CM

## 2013-05-29 DIAGNOSIS — Z0000 Encounter for general adult medical examination without abnormal findings: Secondary | ICD-10-CM

## 2013-05-29 DIAGNOSIS — N189 Chronic kidney disease, unspecified: Secondary | ICD-10-CM

## 2013-05-29 DIAGNOSIS — R739 Hyperglycemia, unspecified: Secondary | ICD-10-CM

## 2013-05-29 DIAGNOSIS — M79606 Pain in leg, unspecified: Secondary | ICD-10-CM

## 2013-05-29 DIAGNOSIS — N182 Chronic kidney disease, stage 2 (mild): Secondary | ICD-10-CM

## 2013-05-29 DIAGNOSIS — I82401 Acute embolism and thrombosis of unspecified deep veins of right lower extremity: Secondary | ICD-10-CM

## 2013-05-29 LAB — GLUCOSE, CAPILLARY: Glucose-Capillary: 100 mg/dL — ABNORMAL HIGH (ref 70–99)

## 2013-05-29 LAB — LIPID PANEL
CHOL/HDL RATIO: 4.4 ratio
Cholesterol: 172 mg/dL (ref 0–200)
HDL: 39 mg/dL — ABNORMAL LOW (ref >39)
LDL CALC: 94 mg/dL (ref 0–99)
Triglycerides: 196 mg/dL — ABNORMAL HIGH (ref <150)
VLDL: 39 mg/dL (ref 0–40)

## 2013-05-29 LAB — POCT GLYCOSYLATED HEMOGLOBIN (HGB A1C): Hemoglobin A1C: 4.9

## 2013-05-29 NOTE — Patient Instructions (Signed)
-  Continue wearing the compression stocking and following up with the Coumadin Clinic.  -Let us know if you prefer to be seen at our Coumadin Clinic, we will be glad to arrange this for you.  -Follow up with us in 3 months or sooner if you have other health concerns.

## 2013-05-29 NOTE — Assessment & Plan Note (Signed)
Creatinine stable at  1.2, GFR from 60s to 70s. He lifts weights often -Check urine for protein, check urine micro/alb ratio

## 2013-05-29 NOTE — Assessment & Plan Note (Signed)
-  He is due for Tdap but declines it today -He is due for diabetes screening, hyperlipidemia screening: Ordered HgA1C, lipid panel

## 2013-05-29 NOTE — Progress Notes (Signed)
   Subjective:    Patient ID: Tom Dalton, male    DOB: 1975-05-25, 38 y.o.   MRN: [id number]  HPI [name] is a 38 yr old woman with PMH of DVT and PE in November 2012, and CKD stage 2, who presents for hospital follow up visit. He was hospitalized last month with Right Lower extremity DVT, thrombolysis was attempted by IR with no success, IVC filter was placed given large lot burden, he was treated with heparin and warfarin was started with therapeutic level reached prior to his discharge. He had had a DVT and PE in 2012 with anticoagulation for one year, until November 2013. He was [facility] for [facility] for DVT but he never returned to clinic. He thought that a leg injury had led to his DVT in 2012 and things another leg injury led to this current DVT. His father had a stroke 5 years ago wich was possibly due to a clot but the patient is not aware of any other history of blood clots in his family.  His laboratory results for coagulation disorders were unremarkable--he had low levels of protein C but these were felt to be appropriate in the context of DVT. He has seen Dr. Bertis RuddyGorsuch in Hematology since his discharge and continues on warfarin, he is followed at the Hematology Coumadin Clinic for INR checks. He continues to wear his compression stockings with the right leg swelling gradually improving. He has right leg pain occasionally which responds to Norco 1-2 tablets per day.  He has upcoming appointment with Dr. Bonnielee HaffHoss in IR for discussion of possible IVC filter removal.  He denies SOB, chest pain, cough, melena, hematuria, or hematochezia.   Review of Systems  Constitutional: Negative for fever, chills, diaphoresis, activity change, appetite change, fatigue and unexpected weight change.  HENT: Negative for nosebleeds.   Respiratory: Negative for cough, shortness of breath and wheezing.   Gastrointestinal: Negative for abdominal pain and blood in stool.    Genitourinary: Negative for dysuria and hematuria.  Musculoskeletal: Positive for myalgias.  Skin: Positive for color change. Negative for pallor, rash and wound.       Right ankle with slight dark discoloration in the medial and lateral aspect.   Neurological: Negative for dizziness, weakness, light-headedness and headaches.  Psychiatric/Behavioral: Negative for behavioral problems and agitation.       Objective:   Physical Exam  Nursing note and vitals reviewed. Constitutional: He is oriented to person, place, and time. He appears well-developed and well-nourished. No distress.  Cardiovascular: Normal rate, regular rhythm and intact distal pulses.   DP and TP 2+ bilaterally and symmetrically  Pulmonary/Chest: Effort normal and breath sounds normal. No respiratory distress. He has no wheezes. He has no rales.  Abdominal: Soft.  Musculoskeletal: Normal range of motion. He exhibits edema. He exhibits no tenderness.  Wearing compression stockings. Significant edema of the Right lower extremity with calf circumference almost twice as big as the left lower leg.  Slightly darker discoloration of the right ankle medially and laterally.    Neurological: He is alert and oriented to person, place, and time.  Skin: Skin is warm and dry. He is not diaphoretic.  Psychiatric: He has a normal mood and affect. His behavior is normal.          Assessment & Plan:

## 2013-05-29 NOTE — Assessment & Plan Note (Signed)
Pt reports that he injured his right leg in February while doing squats and thinks that this was the provoking event for this DVT. Denies SOB, cough, dizziness. His leg swelling is improving, his right foot still has slightly darker discoloration but has good pulse with no cyanosis or s/s of vascular compromise.  He is being followed by Hematology with upcoming appointment with Dr. Bertis RuddyGorsuch. He is seen at the Hematology Coumadin Clinic and wants to be seen there until the end of this month. He is considering transferring care [facility]. He may be interested in seeing Dr. Cyndie ChimeGranfortuna as his PCP.   -Pt to continue taking warfarin per instructions from the Hematology Coumadin Clinic -Pt to continue wearing compression stockings.  -He has f/u with Dr. Bonnielee HaffHoss in IR for discussion of IVC filter removal.

## 2013-05-29 NOTE — Assessment & Plan Note (Signed)
He has occasional leg pain that responds well to Norco PRN, he takes 1-2 tablets per day

## 2013-05-30 LAB — URINALYSIS, ROUTINE W REFLEX MICROSCOPIC
BILIRUBIN URINE: NEGATIVE
GLUCOSE, UA: NEGATIVE mg/dL
Hgb urine dipstick: NEGATIVE
KETONES UR: NEGATIVE mg/dL
Leukocytes, UA: NEGATIVE
Nitrite: NEGATIVE
PROTEIN: NEGATIVE mg/dL
SPECIFIC GRAVITY, URINE: 1.02 (ref 1.005–1.030)
UROBILINOGEN UA: 0.2 mg/dL (ref 0.0–1.0)
pH: 5.5 (ref 5.0–8.0)

## 2013-05-30 LAB — MICROALBUMIN / CREATININE URINE RATIO
CREATININE, URINE: 171 mg/dL
MICROALB UR: 1.47 mg/dL (ref 0.00–1.89)
Microalb Creat Ratio: 8.6 mg/g (ref 0.0–30.0)

## 2013-05-31 ENCOUNTER — Other Ambulatory Visit (HOSPITAL_BASED_OUTPATIENT_CLINIC_OR_DEPARTMENT_OTHER): Payer: 59

## 2013-05-31 ENCOUNTER — Ambulatory Visit (HOSPITAL_BASED_OUTPATIENT_CLINIC_OR_DEPARTMENT_OTHER): Payer: 59 | Admitting: Pharmacist

## 2013-05-31 DIAGNOSIS — I82401 Acute embolism and thrombosis of unspecified deep veins of right lower extremity: Secondary | ICD-10-CM

## 2013-05-31 DIAGNOSIS — I82409 Acute embolism and thrombosis of unspecified deep veins of unspecified lower extremity: Secondary | ICD-10-CM

## 2013-05-31 DIAGNOSIS — D539 Nutritional anemia, unspecified: Secondary | ICD-10-CM

## 2013-05-31 LAB — PROTIME-INR
INR: 4 — ABNORMAL HIGH (ref 2.00–3.50)
Protime: 48 Seconds — ABNORMAL HIGH (ref 10.6–13.4)

## 2013-05-31 LAB — POCT INR: INR: 4

## 2013-05-31 NOTE — Progress Notes (Signed)
Case discussed with Dr. Kennerly at the time of the visit.  We reviewed the resident's history and exam and pertinent patient test results.  I agree with the assessment, diagnosis, and plan of care documented in the resident's note. 

## 2013-05-31 NOTE — Progress Notes (Signed)
INR = 4  Goal 2-3 INR is supratherapeutic today. No complications of anticoagulation noted. He has had no medication or dietary changes. He has taken no additional doses. Will decrease Coumadin dose to Coumadin 5 mg daily except 2.5 mg on Mondays, Wednesdays, Fridays, and Saturdays. Return 06/14/13 at 8:30 am for lab; 8:45 am for Coumadin clinic, and 9am for Dr. Bertis RuddyGorsuch. He is aware INR is elevated and will call the Coumadin Clinic if he experiences any bleeding, bruising, or other concerns.  Cletis AthensLisa Daneya Hartgrove, PharmD

## 2013-06-13 ENCOUNTER — Ambulatory Visit
Admission: RE | Admit: 2013-06-13 | Discharge: 2013-06-13 | Disposition: A | Discharge status: Home / Self Care | Payer: 59 | Source: Ambulatory Visit | Attending: Radiology | Admitting: Radiology

## 2013-06-13 DIAGNOSIS — I82409 Acute embolism and thrombosis of unspecified deep veins of unspecified lower extremity: Secondary | ICD-10-CM

## 2013-06-13 DIAGNOSIS — D72829 Elevated white blood cell count, unspecified: Secondary | ICD-10-CM

## 2013-06-13 DIAGNOSIS — I82401 Acute embolism and thrombosis of unspecified deep veins of right lower extremity: Secondary | ICD-10-CM

## 2013-06-14 ENCOUNTER — Ambulatory Visit (HOSPITAL_BASED_OUTPATIENT_CLINIC_OR_DEPARTMENT_OTHER): Payer: 59

## 2013-06-14 ENCOUNTER — Ambulatory Visit (HOSPITAL_BASED_OUTPATIENT_CLINIC_OR_DEPARTMENT_OTHER): Payer: 59 | Admitting: Hematology and Oncology

## 2013-06-14 ENCOUNTER — Ambulatory Visit (HOSPITAL_BASED_OUTPATIENT_CLINIC_OR_DEPARTMENT_OTHER): Payer: 59 | Admitting: Pharmacist

## 2013-06-14 ENCOUNTER — Telehealth: Payer: Self-pay | Admitting: Hematology and Oncology

## 2013-06-14 VITALS — BP 120/56 | HR 70 | Temp 98.2°F | Resp 18 | Ht 64.0 in | Wt 176.2 lb

## 2013-06-14 DIAGNOSIS — I82409 Acute embolism and thrombosis of unspecified deep veins of unspecified lower extremity: Secondary | ICD-10-CM

## 2013-06-14 DIAGNOSIS — I82401 Acute embolism and thrombosis of unspecified deep veins of right lower extremity: Secondary | ICD-10-CM

## 2013-06-14 DIAGNOSIS — D509 Iron deficiency anemia, unspecified: Secondary | ICD-10-CM

## 2013-06-14 DIAGNOSIS — D539 Nutritional anemia, unspecified: Secondary | ICD-10-CM

## 2013-06-14 DIAGNOSIS — Z86711 Personal history of pulmonary embolism: Secondary | ICD-10-CM

## 2013-06-14 LAB — CBC & DIFF AND RETIC
BASO%: 0.4 % (ref 0.0–2.0)
BASOS ABS: 0 10*3/uL (ref 0.0–0.1)
EOS%: 1.9 % (ref 0.0–7.0)
Eosinophils Absolute: 0.1 10*3/uL (ref 0.0–0.5)
HCT: 38.8 % (ref 38.4–49.9)
HGB: 12.6 g/dL — ABNORMAL LOW (ref 13.0–17.1)
Immature Retic Fract: 5.6 % (ref 3.00–10.60)
LYMPH%: 39.4 % (ref 14.0–49.0)
MCH: 23.6 pg — ABNORMAL LOW (ref 27.2–33.4)
MCHC: 32.5 g/dL (ref 32.0–36.0)
MCV: 72.5 fL — ABNORMAL LOW (ref 79.3–98.0)
MONO#: 0.5 10*3/uL (ref 0.1–0.9)
MONO%: 10 % (ref 0.0–14.0)
NEUT#: 2.3 10*3/uL (ref 1.5–6.5)
NEUT%: 48.3 % (ref 39.0–75.0)
Platelets: 202 10*3/uL (ref 140–400)
RBC: 5.35 10*6/uL (ref 4.20–5.82)
RDW: 15.3 % — ABNORMAL HIGH (ref 11.0–14.6)
RETIC %: 2.57 % — AB (ref 0.80–1.80)
Retic Ct Abs: 137.5 10*3/uL — ABNORMAL HIGH (ref 34.80–93.90)
WBC: 4.8 10*3/uL (ref 4.0–10.3)
lymph#: 1.9 10*3/uL (ref 0.9–3.3)

## 2013-06-14 LAB — PROTIME-INR
INR: 3.8 — ABNORMAL HIGH (ref 2.00–3.50)
Protime: 45.6 Seconds — ABNORMAL HIGH (ref 10.6–13.4)

## 2013-06-14 LAB — POCT INR: INR: 3.8

## 2013-06-14 MED ORDER — WARFARIN SODIUM 5 MG PO TABS: ORAL_TABLET | ORAL | Status: DC

## 2013-06-14 NOTE — Progress Notes (Signed)
INR = 3.8 on Warfarin 2.5 mg daily except 5 mg Tu/Th/Sun He has had no bleeding or bruising. Meds he uses at home are the same & he isn't using anything new OTC. No missed doses of Coumadin. Pt is seeing Dr. Bertis RuddyGorsuch here today. INR above goal.  I will decrease his dose by 20%--> 2.5 mg daily except 5 mg on Mondays. Return in ~10 days. I e-RX'd refill for Warfarin to [facility] on [address] for him today at his request. Ebony HailGinna Estefan Pattison, Pharm.D., CPP 06/14/2013@9 :12 AM

## 2013-06-14 NOTE — Telephone Encounter (Signed)
Gave pt appt for lab and MD for July 2015 °

## 2013-06-15 NOTE — Progress Notes (Signed)
[facility] Dalton OFFICE PROGRESS NOTE  Tom Harp, MD DIAGNOSIS:  Recurrent DVT  SUMMARY OF HEMATOLOGIC HISTORY: In 2012, the patient had trauma to the left lower extremity whereby he hit his left Achilles tendon. Subsequently, he developed acute swelling in the left lower extremity. Ultrasound venous Doppler of bilateral lower extremity on 12/21/2010 showed acute deep vein thrombosis involving the posterior tibial, popliteal, and distal and mid femoral veins of the left lower extremity. There is sluggish flow noted throughout the right lower extremity. CT angiogram perform on 12/21/2010 showed bilateral pulmonary emboli within lobar and segmental branches. He had echocardiogram performed which show no evidence of right ventricular strain. The patient was anticoagulated with warfarin for about a year. He denies any bleeding complications while on warfarin. His anticoagulation therapy was subsequently stopped.  Recently, he complained of an acute onset of right lower extremity swelling and pain. He had been present for several weeks prior to his presentation to the emergency Department. The patient have repeat ultrasound venous Doppler on 05/03/2013 which showed acute deep vein thrombosis involving the posterior tibial, peroneal, popliteal, profunda, femoral , and common femoral veins of the right lower extremity. Diffuse minute flow only noted in the posterior tibial and popliteal veins. There was also evidence of superficial thrombus of the greater saphenous vein from mid thigh to the saphenofemoral junction and including the junction. Due to extensive clots, thrombolytic therapy was performed but it was not helpful to reduce the clot burden.   On 05/03/2013, CT angiogram of the abdomen and pelvis showed from the beginning in the superior segment of the external iliac vein, thrombus is visualized in the right external iliac vein and right common femoral vein extending into the thigh. The  common femoral vein is very distended with clot. Thrombus nearly extends into the right common iliac vein. No thrombus is identified in the IVC. At that point in time, interventional radiologist was consulted and an IVC filter was deployed via the right jugular vein. The reason for placement of IVC filter was unknown but presumably to protect his pulmonary vasculature due to extensive clot burden and prior history of extensive bilateral PE.   In terms of risk factors, He denies recent history of trauma, long distance travel, dehydration, recent surgery, smoking or prolonged immobilization. He had no prior history or diagnosis of cancer. His age appropriate screening programs are up-to-date. The patient had never been testosterone replacement therapy. There is family history of blood clots  in his father.   The patient had screening test for thrombophilia disorder performed while he was hospitalized. He tested negative for protein S deficiency, antithrombin III deficiency, lupus anticoagulant, high homocysteine level, factor V Leiden mutation or prothrombin gene mutation.  The protein C activity and total protein C level were reduced but this not unexpected in the setting of recent clot.   The patient was anticoagulated with heparin, overlap with warfarin and subsequently dismissed with followup here. The patient does not have primary care provider. Of note, the patient was noted to be anemic after his thrombolytic therapy.  INTERVAL HISTORY: Tom Dalton 38 y.o. male returns for followup. He tolerated the warfarin well without recent spontaneous bleeding. He denies persistent right lower extremity swelling, warmth and  tenderness or presence of erythema.  He denies recent chest pain on exertion, shortness of breath on minimal exertion, pre-syncopal episodes, hemoptysis, or palpitation.   I have reviewed the past medical history, past surgical history, social history and family history with the patient  and they are unchanged from previous note.  ALLERGIES:  has No Known Allergies.  MEDICATIONS:  Current Outpatient Prescriptions  Medication Sig Dispense Refill  . DiphenhydrAMINE HCl (WAL-DRYL ALLERGY PO) Take 1 tablet by mouth daily.      Marland Kitchen. HYDROcodone-acetaminophen (NORCO/VICODIN) 5-325 MG per tablet Take 1 tablet by mouth 3 (three) times daily as needed for moderate pain.  60 tablet  0  . warfarin (COUMADIN) 5 MG tablet Take 1/2 tablet (2.5 mg) by mouth daily except 1 tablet (5 mg) on Mondays or as directed  30 tablet  2   No current facility-administered medications for this visit.     REVIEW OF SYSTEMS:   Constitutional: Denies fevers, chills or night sweats Eyes: Denies blurriness of vision Ears, nose, mouth, throat, and face: Denies mucositis or sore throat Respiratory: Denies cough, dyspnea or wheezes Cardiovascular: Denies palpitation, chest discomfort or lower extremity swelling Gastrointestinal:  Denies nausea, heartburn or change in bowel habits Skin: Denies abnormal skin rashes Lymphatics: Denies new lymphadenopathy or easy bruising Neurological:Denies numbness, tingling or new weaknesses Behavioral/Psych: Mood is stable, no new changes  All other systems were reviewed with the patient and are negative.  PHYSICAL EXAMINATION: ECOG PERFORMANCE STATUS: 0 - Asymptomatic  Filed Vitals:   06/14/13 0918  BP: 120/56  Pulse: 70  Temp: 98.2 F (36.8 C)  Resp: 18   Filed Weights   06/14/13 0918  Weight: 176 lb 3.2 oz (79.924 kg)    GENERAL:alert, no distress and comfortable SKIN: skin color, texture, turgor are normal, no rashes or significant lesions EYES: normal, Conjunctiva are pink and non-injected, sclera clear OROPHARYNX:no exudate, no erythema and lips, buccal mucosa, and tongue normal  NECK: supple, thyroid normal size, non-tender, without nodularity LYMPH:  no palpable lymphadenopathy in the cervical, axillary or inguinal LUNGS: clear to auscultation and  percussion with normal breathing effort HEART: regular rate & rhythm and no murmurs and no lower extremity edema ABDOMEN:abdomen soft, non-tender and normal bowel sounds Musculoskeletal:no cyanosis of digits and no clubbing  NEURO: alert & oriented x 3 with fluent speech, no focal motor/sensory deficits  LABORATORY DATA:  I have reviewed the data as listed Results for orders placed in visit on 06/14/13 (from the past 48 hour(s))  POCT INR     Status: None   Collection Time    06/14/13 12:00 AM      Result Value Ref Range   INR 3.8      Lab Results  Component Value Date   WBC 4.8 06/14/2013   HGB 12.6* 06/14/2013   HCT 38.8 06/14/2013   MCV 72.5* 06/14/2013   PLT 202 06/14/2013    RADIOGRAPHIC STUDIES: I have personally reviewed the radiological images as listed and agreed with the findings in the report. No results found.  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   We discussed about various options of anticoagulation therapies including warfarin, low molecular weight heparin such as Lovenox or newer agents such as Rivaroxaban. The reason I chose warfarin is because of history of acute renal failure. I am also concerned about the new onset of anemia that he may have bleeding. He is comfortable to continue on warfarin for now however in the future, he may want to consider switching to Xarelto which I think is reasonable.  I recommend the patient to use elastic  compression stockings at 20-30 mmHg to reduce risks of chronic thrombophlebitis. He is compliant with this recommendation.  Another main issue we discussed today included the role of screening other family members for thrombophilia disorder. At present time, I would not recommend testing the patient's family members as it would not benefit them.  Thrombophilia disorder is a  genetic predisposition which increases an individual's risk for a thrombotic event, NOT a disease.    The patient does not have any primary care provider. He will continue on INR monitoring here. Another major issue that we just this is removed 4 of the IVC filter. I recommend bringing him back in 3 months and at that point in time if everything is stable I will refer him back to interventional radiologist to have the IVC filter removal. He agrees. #3 microcytic anemia The anemia is new since his thrombolytic therapy. This is improving. Due to significant microcytosis seen, I'm also wondering whether the patient may have thalassemia. I will border appropriate workup for this.  All questions were answered. The patient knows to call the clinic with any problems, questions or concerns. No barriers to learning was detected.  I spent 25 minutes counseling the patient face to face. The total time spent in the appointment was 30 minutes and more than 50% was on counseling.     Artis DelayNi Kayron Kalmar, MD 06/15/2013 8:28 AM

## 2013-06-23 LAB — HGB ELECTROPHORESIS REFLEXED REPORT
HEMOGLOBIN E: 22.7 % — AB
HEMOGLOBIN F - HGBRFX: 0.5 % (ref <2.0)
Hemoglobin A - HGBRFX: 72.7 % — ABNORMAL LOW (ref >96.0)
Hemoglobin A2 - HGBRFX: 3.8 % — ABNORMAL HIGH (ref 1.8–3.5)
Sickle Solubility Test - HGBRFX: NEGATIVE

## 2013-06-23 LAB — HEMOGLOBINOPATHY EVALUATION
HGB A2 QUANT: 3.2 % (ref 2.2–3.2)
HGB A: 75.6 % — AB (ref 96.8–97.8)
HGB S QUANTITAION: 0 %
Hemoglobin Other: 21.2 % — ABNORMAL HIGH
Hgb F Quant: 0 % (ref 0.0–2.0)

## 2013-06-26 ENCOUNTER — Ambulatory Visit (HOSPITAL_BASED_OUTPATIENT_CLINIC_OR_DEPARTMENT_OTHER): Payer: 59 | Admitting: Pharmacist

## 2013-06-26 ENCOUNTER — Other Ambulatory Visit: Payer: 59

## 2013-06-26 DIAGNOSIS — I82409 Acute embolism and thrombosis of unspecified deep veins of unspecified lower extremity: Secondary | ICD-10-CM

## 2013-06-26 DIAGNOSIS — I82401 Acute embolism and thrombosis of unspecified deep veins of right lower extremity: Secondary | ICD-10-CM

## 2013-06-26 DIAGNOSIS — D539 Nutritional anemia, unspecified: Secondary | ICD-10-CM

## 2013-06-26 LAB — POCT INR: INR: 1.8

## 2013-06-26 LAB — PROTIME-INR
INR: 1.8 — ABNORMAL LOW (ref 2.00–3.50)
Protime: 21.6 Seconds — ABNORMAL HIGH (ref 10.6–13.4)

## 2013-06-26 NOTE — Progress Notes (Signed)
INR slightly below goal today. Pt took as instructed at last visit. No missed coumadin doses.  No changes in diet or medications. No problems or concerns regarding anticoagulation. Left lower leg swelling continues to improve per patient report. Take 7.5mg  today. On 06/27/13, increase coumadin to 2.5 mg (1/2 tablet) daily except 5 mg (1 tablet) on Mondays & Thursdays. Return 07/11/13 at 8:30 am for lab; 8:45 am for Coumadin clinic.

## 2013-06-26 NOTE — Patient Instructions (Addendum)
Take 7.5mg  today. On 06/27/13, increase coumadin to 2.5 mg (1/2 tablet) daily except 5 mg (1 tablet) on Mondays & Thursdays. Return 07/11/13 at 8:30 am for lab; 8:45 am for Coumadin clinic.

## 2013-07-11 ENCOUNTER — Ambulatory Visit (HOSPITAL_BASED_OUTPATIENT_CLINIC_OR_DEPARTMENT_OTHER): Payer: 59 | Admitting: Pharmacist

## 2013-07-11 ENCOUNTER — Other Ambulatory Visit (HOSPITAL_BASED_OUTPATIENT_CLINIC_OR_DEPARTMENT_OTHER): Payer: 59

## 2013-07-11 DIAGNOSIS — I82401 Acute embolism and thrombosis of unspecified deep veins of right lower extremity: Secondary | ICD-10-CM

## 2013-07-11 DIAGNOSIS — D539 Nutritional anemia, unspecified: Secondary | ICD-10-CM

## 2013-07-11 DIAGNOSIS — I82409 Acute embolism and thrombosis of unspecified deep veins of unspecified lower extremity: Secondary | ICD-10-CM

## 2013-07-11 LAB — PROTIME-INR
INR: 2.1 (ref 2.00–3.50)
Protime: 25.2 Seconds — ABNORMAL HIGH (ref 10.6–13.4)

## 2013-07-11 LAB — POCT INR: INR: 2.1

## 2013-07-11 NOTE — Progress Notes (Signed)
INR at goal on current coumadin doses.  Tom Dalton does not take any additional medications.  No bleeding or bruising noted.  Will continue current dose of coumadin 2.5mg  daily but 5mg  M/Th.  Will check PT/INR in 1 month.

## 2013-08-07 ENCOUNTER — Other Ambulatory Visit: Payer: 59

## 2013-08-07 ENCOUNTER — Other Ambulatory Visit: Payer: Self-pay | Admitting: Hematology and Oncology

## 2013-08-07 ENCOUNTER — Ambulatory Visit (HOSPITAL_BASED_OUTPATIENT_CLINIC_OR_DEPARTMENT_OTHER): Payer: 59 | Admitting: Pharmacist

## 2013-08-07 DIAGNOSIS — I82401 Acute embolism and thrombosis of unspecified deep veins of right lower extremity: Secondary | ICD-10-CM

## 2013-08-07 DIAGNOSIS — D539 Nutritional anemia, unspecified: Secondary | ICD-10-CM

## 2013-08-07 DIAGNOSIS — I82409 Acute embolism and thrombosis of unspecified deep veins of unspecified lower extremity: Secondary | ICD-10-CM

## 2013-08-07 LAB — POCT INR: INR: 1.7

## 2013-08-07 LAB — PROTIME-INR
INR: 1.7 — ABNORMAL LOW (ref 2.00–3.50)
Protime: 20.4 Seconds — ABNORMAL HIGH (ref 10.6–13.4)

## 2013-08-07 NOTE — Progress Notes (Signed)
INR = 1.7 on Coumadin 2.5 mg daily except 5 mg on Mon/Thurs. No missed doses. He had green vegetables yesterday. No s/sxs of VTE. INR slightly low today, likely due to increased vit K intake.  I will not change is Coumadin dose.   Return in 1 month.  If INR is low then, we will need to make adjustments in his dose. Ebony HailGinna Dashawn Bartnick, Pharm.D., CPP 08/07/2013@9 :10 AM

## 2013-09-04 ENCOUNTER — Ambulatory Visit (HOSPITAL_BASED_OUTPATIENT_CLINIC_OR_DEPARTMENT_OTHER): Payer: 59 | Admitting: Pharmacist

## 2013-09-04 ENCOUNTER — Other Ambulatory Visit: Payer: 59

## 2013-09-04 DIAGNOSIS — I82401 Acute embolism and thrombosis of unspecified deep veins of right lower extremity: Secondary | ICD-10-CM

## 2013-09-04 DIAGNOSIS — I82409 Acute embolism and thrombosis of unspecified deep veins of unspecified lower extremity: Secondary | ICD-10-CM

## 2013-09-04 DIAGNOSIS — D539 Nutritional anemia, unspecified: Secondary | ICD-10-CM

## 2013-09-04 LAB — PROTIME-INR
INR: 1.9 — ABNORMAL LOW (ref 2.00–3.50)
Protime: 22.8 Seconds — ABNORMAL HIGH (ref 10.6–13.4)

## 2013-09-04 LAB — POCT INR: INR: 1.9

## 2013-09-04 NOTE — Patient Instructions (Signed)
Slightly increase coumadin to 2.5mg  daily except 5mg  on MWF.  Recheck INR in 10 days with scheduled appointments on 09/13/13: lab at 8am, coumadin clinic at 8:15am and Dr. Bertis RuddyGorsuch at 8:30am.

## 2013-09-04 NOTE — Progress Notes (Signed)
INR slightly below goal today. Pt took coumadin as instructed. No missed or extra coumadin doses. No changes in diet or medications. No problems or concerns regarding anticoagulation. No s/s of clotting noted. INR has been below goal x 2 consecutive visits. Will slightly increase coumadin dose. Slightly increase coumadin to 2.5mg  daily except 5mg  on MWF.  Recheck INR in 10 days with scheduled appointments on 09/13/13: lab at 8am, coumadin clinic at 8:15am and Dr. Bertis RuddyGorsuch at 8:30am.

## 2013-09-13 ENCOUNTER — Telehealth: Payer: Self-pay | Admitting: Hematology and Oncology

## 2013-09-13 ENCOUNTER — Encounter: Payer: Self-pay | Admitting: Hematology and Oncology

## 2013-09-13 ENCOUNTER — Other Ambulatory Visit (HOSPITAL_BASED_OUTPATIENT_CLINIC_OR_DEPARTMENT_OTHER): Payer: 59

## 2013-09-13 ENCOUNTER — Ambulatory Visit (HOSPITAL_BASED_OUTPATIENT_CLINIC_OR_DEPARTMENT_OTHER): Payer: Self-pay | Admitting: Pharmacist

## 2013-09-13 ENCOUNTER — Ambulatory Visit (HOSPITAL_BASED_OUTPATIENT_CLINIC_OR_DEPARTMENT_OTHER): Payer: 59 | Admitting: Hematology and Oncology

## 2013-09-13 VITALS — BP 134/70 | HR 66 | Temp 98.8°F | Resp 20 | Ht 64.0 in | Wt 174.9 lb

## 2013-09-13 DIAGNOSIS — Z7901 Long term (current) use of anticoagulants: Secondary | ICD-10-CM

## 2013-09-13 DIAGNOSIS — I82401 Acute embolism and thrombosis of unspecified deep veins of right lower extremity: Secondary | ICD-10-CM

## 2013-09-13 DIAGNOSIS — D539 Nutritional anemia, unspecified: Secondary | ICD-10-CM

## 2013-09-13 DIAGNOSIS — D582 Other hemoglobinopathies: Secondary | ICD-10-CM

## 2013-09-13 DIAGNOSIS — I82409 Acute embolism and thrombosis of unspecified deep veins of unspecified lower extremity: Secondary | ICD-10-CM

## 2013-09-13 DIAGNOSIS — R609 Edema, unspecified: Secondary | ICD-10-CM | POA: Insufficient documentation

## 2013-09-13 LAB — COMPREHENSIVE METABOLIC PANEL (CC13)
ALK PHOS: 52 U/L (ref 40–150)
ALT: 34 U/L (ref 0–55)
AST: 35 U/L — AB (ref 5–34)
Albumin: 3.9 g/dL (ref 3.5–5.0)
Anion Gap: 7 mEq/L (ref 3–11)
BILIRUBIN TOTAL: 1.62 mg/dL — AB (ref 0.20–1.20)
BUN: 17.7 mg/dL (ref 7.0–26.0)
CO2: 26 mEq/L (ref 22–29)
Calcium: 9.2 mg/dL (ref 8.4–10.4)
Chloride: 106 mEq/L (ref 98–109)
Creatinine: 1.3 mg/dL (ref 0.7–1.3)
GLUCOSE: 113 mg/dL (ref 70–140)
Potassium: 3.9 mEq/L (ref 3.5–5.1)
SODIUM: 139 meq/L (ref 136–145)
TOTAL PROTEIN: 7.3 g/dL (ref 6.4–8.3)

## 2013-09-13 LAB — CBC & DIFF AND RETIC
BASO%: 0.2 % (ref 0.0–2.0)
Basophils Absolute: 0 10*3/uL (ref 0.0–0.1)
EOS%: 1.1 % (ref 0.0–7.0)
Eosinophils Absolute: 0.1 10*3/uL (ref 0.0–0.5)
HCT: 39.6 % (ref 38.4–49.9)
HGB: 13.3 g/dL (ref 13.0–17.1)
IMMATURE RETIC FRACT: 12 % — AB (ref 3.00–10.60)
LYMPH#: 1.7 10*3/uL (ref 0.9–3.3)
LYMPH%: 31.8 % (ref 14.0–49.0)
MCH: 23.5 pg — ABNORMAL LOW (ref 27.2–33.4)
MCHC: 33.6 g/dL (ref 32.0–36.0)
MCV: 70.1 fL — ABNORMAL LOW (ref 79.3–98.0)
MONO#: 0.3 10*3/uL (ref 0.1–0.9)
MONO%: 6.1 % (ref 0.0–14.0)
NEUT%: 60.8 % (ref 39.0–75.0)
NEUTROS ABS: 3.3 10*3/uL (ref 1.5–6.5)
Platelets: 187 10*3/uL (ref 140–400)
RBC: 5.65 10*6/uL (ref 4.20–5.82)
RDW: 14.7 % — ABNORMAL HIGH (ref 11.0–14.6)
RETIC %: 1.76 % (ref 0.80–1.80)
Retic Ct Abs: 99.44 10*3/uL — ABNORMAL HIGH (ref 34.80–93.90)
WBC: 5.4 10*3/uL (ref 4.0–10.3)

## 2013-09-13 LAB — POCT INR: INR: 1.9

## 2013-09-13 LAB — PROTIME-INR
INR: 1.9 — AB (ref 2.00–3.50)
Protime: 22.8 Seconds — ABNORMAL HIGH (ref 10.6–13.4)

## 2013-09-13 NOTE — Assessment & Plan Note (Signed)
This is due to thalassemia. He is not symptomatic.

## 2013-09-13 NOTE — Assessment & Plan Note (Addendum)
The patient has completed slightly over 3 months of anticoagulation therapy. I recommend consultation with interventional radiologist for consideration to remove the IVC filter. I discussed with the patient the risks, benefits, side effects of maintaining on warfarin versus switching over to Xarelto. He will think about it. We also discussed about implication of genetic testing for other family members. We are in agreement to just educate family members rather than testing them.

## 2013-09-13 NOTE — Telephone Encounter (Signed)
lvm for pt regarding to jan 2016 appt...mailed pt appt sched/avs and letter °

## 2013-09-13 NOTE — Telephone Encounter (Signed)
lvm for pt regarding to jan 2016 appt...mailed pt appt sched/avs and letter

## 2013-09-13 NOTE — Assessment & Plan Note (Signed)
I continue to encourage him to use elastic compression hose.

## 2013-09-13 NOTE — Patient Instructions (Signed)
Change coumadin to 5 mg daily except 2.5mg  on MWF. Recheck INR in 4 weeks on 10/09/13: lab at 8:30 am and coumadin clinic at 8:45am

## 2013-09-13 NOTE — Progress Notes (Signed)
Pt seen in clinic prior to MD appmt with Bertis RuddyGorsuch  INR=1.9 on 5mg  MWF with 2.5 mg other days No changes to report Pt only takes benadryl as needed and coumadin No missed doses No S/S of bleeding or clotting  Will slightly increase dose to coumadin to 5 mg daily except 2.5mg  on MWF.  Recheck INR in 4 weeks on 10/09/13: lab at 8:30 am and coumadin clinic at 8:45am

## 2013-09-13 NOTE — Assessment & Plan Note (Signed)
I discussed with him and his wife the pathophysiology of thalassemia. Again, his children likely will inherit his condition. I recommend CBC screening rather than checking his children for hemoglobin E disease.

## 2013-09-13 NOTE — Progress Notes (Signed)
[facility] OFFICE PROGRESS NOTE  Dalton, [name], MD SUMMARY OF HEMATOLOGIC HISTORY: In 2012, the patient had trauma to the left lower extremity whereby he hit his left Achilles tendon. Subsequently, he developed acute swelling in the left lower extremity. Ultrasound venous Doppler of bilateral lower extremity on 12/21/2010 showed acute deep vein thrombosis involving the posterior tibial, popliteal, and distal and mid femoral veins of the left lower extremity. There is sluggish flow noted throughout the right lower extremity. CT angiogram perform on 12/21/2010 showed bilateral pulmonary emboli within lobar and segmental branches. He had echocardiogram performed which show no evidence of right ventricular strain. The patient was anticoagulated with warfarin for about a year. He denies any bleeding complications while on warfarin. His anticoagulation therapy was subsequently stopped.  Recently, he complained of an acute onset of right lower extremity swelling and pain. He had been present for several weeks prior to his presentation to the emergency Department. The patient have repeat ultrasound venous Doppler on 05/03/2013 which showed acute deep vein thrombosis involving the posterior tibial, peroneal, popliteal, profunda, femoral , and common femoral veins of the right lower extremity. Diffuse minute flow only noted in the posterior tibial and popliteal veins. There was also evidence of superficial thrombus of the greater saphenous vein from mid thigh to the saphenofemoral junction and including the junction. Due to extensive clots, thrombolytic therapy was performed but it was not helpful to reduce the clot burden.   On 05/03/2013, CT angiogram of the abdomen and pelvis showed from the beginning in the superior segment of the external iliac vein, thrombus is visualized in the right external iliac vein and right common femoral vein extending into the thigh. The common femoral vein is very  distended with clot. Thrombus nearly extends into the right common iliac vein. No thrombus is identified in the IVC. At that point in time, interventional radiologist was consulted and an IVC filter was deployed via the right jugular vein. The reason for placement of IVC filter was unknown but presumably to protect his pulmonary vasculature due to extensive clot burden and prior history of extensive bilateral PE.   In terms of risk factors, He denies recent history of trauma, long distance travel, dehydration, recent surgery, smoking or prolonged immobilization. He had no prior history or diagnosis of cancer. His age appropriate screening programs are up-to-date. The patient had never been testosterone replacement therapy. There is family history of blood clots  in his father.   The patient had screening test for thrombophilia disorder performed while he was hospitalized. He tested negative for protein S deficiency, antithrombin III deficiency, lupus anticoagulant, high homocysteine level, factor V Leiden mutation or prothrombin gene mutation.  The protein C activity and total protein C level were reduced but this not unexpected in the setting of recent clot.   The patient was anticoagulated with heparin, overlap with warfarin and subsequently dismissed with followup here. The patient does not have primary care provider. Of note, the patient was noted to be anemic after his thrombolytic therapy.  INTERVAL HISTORY: Tom Dalton 38 y.o. male returns for further followup. He complained of persistent right leg edema. He is doing well on anticoagulation therapy. The patient denies any recent signs or symptoms of bleeding such as spontaneous epistaxis, hematuria or hematochezia.   I have reviewed the past medical history, past surgical history, social history and family history with the patient and they are unchanged from previous note.  ALLERGIES:  has No Known Allergies.  MEDICATIONS:  Current  Outpatient Prescriptions  Medication Sig Dispense Refill  . DiphenhydrAMINE HCl (WAL-DRYL ALLERGY PO) Take 1 tablet by mouth daily.      Marland Kitchen warfarin (COUMADIN) 5 MG tablet Take 1/2 tablet (2.5 mg) by mouth daily except 1 tablet (5 mg) on Mondays or as directed  30 tablet  2   No current facility-administered medications for this visit.     REVIEW OF SYSTEMS:   Constitutional: Denies fevers, chills or night sweats Eyes: Denies blurriness of vision Ears, nose, mouth, throat, and face: Denies mucositis or sore throat Respiratory: Denies cough, dyspnea or wheezes Cardiovascular: Denies palpitation, chest discomfort  Gastrointestinal:  Denies nausea, heartburn or change in bowel habits Skin: Denies abnormal skin rashes Lymphatics: Denies new lymphadenopathy or easy bruising Neurological:Denies numbness, tingling or new weaknesses Behavioral/Psych: Mood is stable, no new changes  All other systems were reviewed with the patient and are negative.  PHYSICAL EXAMINATION: ECOG PERFORMANCE STATUS: 0 - Asymptomatic  Filed Vitals:   09/13/13 0844  BP: 134/70  Pulse: 66  Temp: 98.8 F (37.1 C)  Resp: 20   Filed Weights   09/13/13 0844  Weight: 174 lb 14.4 oz (79.334 kg)    GENERAL:alert, no distress and comfortable SKIN: skin color, texture, turgor are normal, no rashes or significant lesions HEART: Note her persistent right lower extremity edema ABDOMEN:abdomen soft, non-tender and normal bowel sounds Musculoskeletal:no cyanosis of digits and no clubbing  NEURO: alert & oriented x 3 with fluent speech, no focal motor/sensory deficits  LABORATORY DATA:  I have reviewed the data as listed Results for orders placed in visit on 09/13/13 (from the past 48 hour(s))  POCT INR     Status: None   Collection Time    09/13/13  8:42 AM      Result Value Ref Range   INR 1.9      Lab Results  Component Value Date   WBC 5.4 09/13/2013   HGB 13.3 09/13/2013   HCT 39.6 09/13/2013   MCV 70.1*  09/13/2013   PLT 187 09/13/2013   ASSESSMENT & PLAN:  Right leg DVT The patient has completed slightly over 3 months of anticoagulation therapy. I recommend consultation with interventional radiologist for consideration to remove the IVC filter. I discussed with the patient the risks, benefits, side effects of maintaining on warfarin versus switching over to Xarelto. He will think about it. We also discussed about implication of genetic testing for other family members. We are in agreement to just educate family members rather than testing them.  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 Again, his children likely will inherit his condition. I recommend CBC screening rather than checking his children for hemoglobin E disease.  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 He is not symptomatic.    All questions were answered. The patient knows to call the clinic with any problems, questions or concerns. No barriers to learning was detected.  I spent 25 minutes counseling the patient face to face. The total time spent in the appointment was 30 minutes and more than 50% was on counseling.     [facility], Latoya Diskin, MD 09/13/2013 9:39 AM

## 2013-10-09 ENCOUNTER — Other Ambulatory Visit (HOSPITAL_BASED_OUTPATIENT_CLINIC_OR_DEPARTMENT_OTHER): Payer: 59

## 2013-10-09 ENCOUNTER — Ambulatory Visit (HOSPITAL_BASED_OUTPATIENT_CLINIC_OR_DEPARTMENT_OTHER): Payer: 59 | Admitting: Pharmacist

## 2013-10-09 DIAGNOSIS — I82409 Acute embolism and thrombosis of unspecified deep veins of unspecified lower extremity: Secondary | ICD-10-CM

## 2013-10-09 DIAGNOSIS — D539 Nutritional anemia, unspecified: Secondary | ICD-10-CM

## 2013-10-09 DIAGNOSIS — I82401 Acute embolism and thrombosis of unspecified deep veins of right lower extremity: Secondary | ICD-10-CM

## 2013-10-09 LAB — PROTIME-INR
INR: 3.5 (ref 2.00–3.50)
Protime: 42 Seconds — ABNORMAL HIGH (ref 10.6–13.4)

## 2013-10-09 LAB — POCT INR: INR: 3.5

## 2013-10-09 MED ORDER — RIVAROXABAN 20 MG PO TABS: 20.0000 mg | ORAL_TABLET | Freq: Every day | ORAL | Status: DC

## 2013-10-09 NOTE — Progress Notes (Signed)
INR = 3.5 on Coumadin 5 mg/day; 2.5 mg on MWF No bleeding/bruising. No med changes recently. He had EtOH last Friday but none in past 7 days. He is having his IVC filter removed 10/24/13. Instructions for anticoag bridging as follows (per Dr. Bertis Ruddy): Stop Coumadin on 10/19/13 (5 days prior to surgery) Take Lovenox injection (120 mg Subcutaneous) on 10/22/13 in the morning. No Lovenox on 10/23/13 Surgery to remove filter on 10/24/13 Take Lovenox injection (120 mg Subcutaneous) on the evening of the surgery (10/24/13) unless told after surgery to hold. Begin Xarelto 20 mg by mouth daily on 10/25/13. He will no longer be followed in our Coumadin clinic since he is switching to Xarelto. I provided him w/ the 2 Lovenox 120 mg injections he needed around his surgery.  I also gave him 10 tablets of Coumadin 5 mg (lot# [id number]; exp 12/2014) since he said he was almost out of Coumadin. I RX'd the Xarelto to [facility] & he understood he needs to pick them up before 10/24/13.  Pt repeated all above instructions.  I gave him our clinic phone # if he has any questions. Ebony Hail, Pharm.D., CPP 10/09/2013@10 :24 AM

## 2013-10-09 NOTE — Patient Instructions (Signed)
Stop Coumadin on 10/19/13  Take Lovenox injection (120 mg Subcutaneous) on 10/22/13 in the morning. No Lovenox on 10/23/13 Surgery to remove filter on 10/24/13 Take Lovenox injection (120 mg Subcutaneous) on the evening of the surgery (10/24/13) unless you are told after surgery to hold. Begin Xarelto 20 mg by mouth daily on 10/25/13.

## 2013-10-19 ENCOUNTER — Other Ambulatory Visit: Payer: Self-pay | Admitting: Radiology

## 2013-10-19 ENCOUNTER — Encounter (HOSPITAL_COMMUNITY): Payer: Self-pay | Admitting: Pharmacy Technician

## 2013-10-20 ENCOUNTER — Other Ambulatory Visit: Payer: Self-pay | Admitting: Radiology

## 2013-10-24 ENCOUNTER — Encounter (HOSPITAL_COMMUNITY): Payer: Self-pay

## 2013-10-24 ENCOUNTER — Ambulatory Visit (HOSPITAL_COMMUNITY)
Admission: RE | Admit: 2013-10-24 | Discharge: 2013-10-24 | Disposition: A | Discharge status: Home / Self Care | Payer: 59 | Source: Ambulatory Visit | Attending: Hematology and Oncology | Admitting: Hematology and Oncology

## 2013-10-24 DIAGNOSIS — F1010 Alcohol abuse, uncomplicated: Secondary | ICD-10-CM | POA: Diagnosis not present

## 2013-10-24 DIAGNOSIS — Z86718 Personal history of other venous thrombosis and embolism: Secondary | ICD-10-CM | POA: Insufficient documentation

## 2013-10-24 DIAGNOSIS — Z7901 Long term (current) use of anticoagulants: Secondary | ICD-10-CM | POA: Insufficient documentation

## 2013-10-24 DIAGNOSIS — Z4689 Encounter for fitting and adjustment of other specified devices: Secondary | ICD-10-CM | POA: Diagnosis not present

## 2013-10-24 DIAGNOSIS — I82401 Acute embolism and thrombosis of unspecified deep veins of right lower extremity: Secondary | ICD-10-CM

## 2013-10-24 DIAGNOSIS — Z87891 Personal history of nicotine dependence: Secondary | ICD-10-CM | POA: Insufficient documentation

## 2013-10-24 LAB — CBC
HCT: 39.7 % (ref 39.0–52.0)
Hemoglobin: 13.6 g/dL (ref 13.0–17.0)
MCH: 24.5 pg — ABNORMAL LOW (ref 26.0–34.0)
MCHC: 34.3 g/dL (ref 30.0–36.0)
MCV: 71.5 fL — ABNORMAL LOW (ref 78.0–100.0)
Platelets: 171 10*3/uL (ref 150–400)
RBC: 5.55 MIL/uL (ref 4.22–5.81)
RDW: 15 % (ref 11.5–15.5)
WBC: 4.8 10*3/uL (ref 4.0–10.5)

## 2013-10-24 LAB — BASIC METABOLIC PANEL
ANION GAP: 12 (ref 5–15)
BUN: 13 mg/dL (ref 6–23)
CALCIUM: 9.3 mg/dL (ref 8.4–10.5)
CHLORIDE: 103 meq/L (ref 96–112)
CO2: 26 mEq/L (ref 19–32)
Creatinine, Ser: 1.13 mg/dL (ref 0.50–1.35)
GFR calc non Af Amer: 80 mL/min — ABNORMAL LOW (ref >90)
Glucose, Bld: 88 mg/dL (ref 70–99)
Potassium: 3.9 mEq/L (ref 3.7–5.3)
SODIUM: 141 meq/L (ref 137–147)

## 2013-10-24 LAB — PROTIME-INR
INR: 0.97 (ref 0.00–1.49)
PROTHROMBIN TIME: 12.9 s (ref 11.6–15.2)

## 2013-10-24 MED ORDER — MIDAZOLAM HCL 2 MG/2ML IJ SOLN
INTRAMUSCULAR | Status: AC | PRN
  Administered 2013-10-24 (×2): 1 mg via INTRAVENOUS

## 2013-10-24 MED ORDER — FENTANYL CITRATE 0.05 MG/ML IJ SOLN
INTRAMUSCULAR | Status: AC | PRN
  Administered 2013-10-24 (×2): 50 ug via INTRAVENOUS

## 2013-10-24 MED ORDER — SODIUM CHLORIDE 0.9 % IV SOLN
INTRAVENOUS | Status: DC
  Administered 2013-10-24: 12:00:00 via INTRAVENOUS

## 2013-10-24 MED ORDER — FENTANYL CITRATE 0.05 MG/ML IJ SOLN
INTRAMUSCULAR | Status: AC
  Filled 2013-10-24: qty 4

## 2013-10-24 MED ORDER — MIDAZOLAM HCL 2 MG/2ML IJ SOLN
INTRAMUSCULAR | Status: AC
  Filled 2013-10-24: qty 4

## 2013-10-24 NOTE — Discharge Instructions (Signed)
Conscious Sedation, Adult, Care After Refer to this sheet in the next few weeks. These instructions provide you with information on caring for yourself after your procedure. Your health care provider may also give you more specific instructions. Your treatment has been planned according to current medical practices, but problems sometimes occur. Call your health care provider if you have any problems or questions after your procedure. WHAT TO EXPECT AFTER THE PROCEDURE  After your procedure:  You may feel sleepy, clumsy, and have poor balance for several hours.  Vomiting may occur if you eat too soon after the procedure. HOME CARE INSTRUCTIONS  Do not participate in any activities where you could become injured for at least 24 hours. Do not:  Drive.  Swim.  Ride a bicycle.  Operate heavy machinery.  Cook.  Use power tools.  Climb ladders.  Work from a high place.  Do not make important decisions or sign legal documents until you are improved.  If you vomit, drink water, juice, or soup when you can drink without vomiting. Make sure you have little or no nausea before eating solid foods.  Only take over-the-counter or prescription medicines for pain, discomfort, or fever as directed by your health care provider.  Make sure you and your family fully understand everything about the medicines given to you, including what side effects may occur.  You should not drink alcohol, take sleeping pills, or take medicines that cause drowsiness for at least 24 hours.  If you smoke, do not smoke without supervision.  If you are feeling better, you may resume normal activities 24 hours after you were sedated.  Keep all appointments with your health care provider. SEEK MEDICAL CARE IF:  Your skin is pale or bluish in color.  You continue to feel nauseous or vomit.  Your pain is getting worse and is not helped by medicine.  You have bleeding or swelling.  You are still sleepy or  feeling clumsy after 24 hours. SEEK IMMEDIATE MEDICAL CARE IF:  You develop a rash.  You have difficulty breathing.  You develop any type of allergic problem.  You have a fever. MAKE SURE YOU:  Understand these instructions.  Will watch your condition.  Will get help right away if you are not doing well or get worse. Document Released: 11/23/2012 Document Reviewed: 11/23/2012 [facility] Patient Information 2015 [place], [place]. This information is not intended to replace advice given to you by your health care provider. Make sure you discuss any questions you have with your health care provider.   Inferior Vena Cava Filter Removal, Care After Refer to this sheet in the next few weeks. These instructions provide you with information on caring for yourself after your procedure. Your health care provider may also give you more specific instructions. Your treatment has been planned according to current medical practices, but problems sometimes occur. Call your health care provider if you have any problems or questions after your procedure.  HOME CARE INSTRUCTIONS  A bandage (dressing) has been placed over the site. Remove dressing tomorrow and may shower.  SEEK IMMEDIATE MEDICAL CARE IF:  You develop swelling and discoloration or pain in the legs.  Your legs become pale and cold or blue.  You develop shortness of breath, feel faint, or pass out.  You develop chest pain, a cough, or difficulty breathing.  You cough up blood.  You develop a rash or feel you are having problems that may be a side effect of medicines.  You develop  weakness, difficulty moving your arms or legs, or balance problems.  You develop problems with speech or vision. Document Released: 11/23/2012 Document Reviewed: 11/23/2012 [facility] Patient Information 2015 [place], [place]. This information is not intended to replace advice given to you by your health care provider. Make sure you discuss any questions you  have with your health care provider.

## 2013-10-24 NOTE — H&P (Signed)
Chief Complaint: "I'm here for a filter retrieval" Referring Physician:Gorsuch HPI: Tom Dalton is an 38 y.o. 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 He has remained on chronic anticoagulation therapy, currently Xarelto. He has seen his hematologist and has not had any new thrombotic events. He is referred for IVC filter retrieval. Pt has been NPO. PMHx, meds, labs reviewed.  Past Medical History:  Past Medical History  Diagnosis Date  . Tobacco abuse   . ETOH abuse   . Headache(784.0)     when the weather changes, advil  . Pneumonia     5 years ago  . Acute renal failure 12/24/2010  . Blood clot in vein   . 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 Leg pain 05/17/2013  . Unspecified deficiency anemia 05/17/2013    Past Surgical History: History reviewed. No pertinent past surgical history.  Family History:  Family History  Problem Relation Age of Onset  . Stroke Father     no premature CAD  . Clotting disorder Father     ?blood clot  . Healthy Mother     no premature CAD    Social History:  reports that he has quit smoking. He has never used smokeless tobacco. He reports that he drinks about 9 ounces of alcohol per week. He reports that he does not use illicit drugs.  Allergies: No Known Allergies  Medications:   Medication List    ASK your doctor about these medications       diphenhydrAMINE 25 MG tablet  Commonly known as:  BENADRYL  Take 25 mg by mouth as needed.     loratadine 10 MG tablet  Commonly known as:  CLARITIN  Take 10 mg by mouth daily.     rivaroxaban 20 MG Tabs tablet  Commonly known as:  XARELTO  Take 1 tablet (20 mg total) by mouth daily with supper.  Start taking on:  10/25/2013      Please HPI for pertinent positives, otherwise complete 10 system ROS negative.  Physical Exam: BP 115/75  Pulse 59  Temp(Src) 98.3 F (36.8 C) (Oral)  Resp 16  Ht $R'5\' 4"'by$  (1.626 m)  Wt 172 lb (78.019 kg)  BMI  29.51 kg/m2  SpO2 100% Body mass index is 29.51 kg/(m^2).   General Appearance:  Alert, cooperative, no distress, appears stated age  Head:  Normocephalic, without obvious abnormality, atraumatic  ENT: Unremarkable  Neck: Supple, symmetrical, trachea midline  Lungs:   Clear to auscultation bilaterally, no w/r/r, respirations unlabored without use of accessory muscles.  Chest Wall:  No tenderness or deformity  Heart:  Regular rate and rhythm, S1, S2 normal, no murmur, rub or gallop.  Abdomen:   Soft, non-tender, non distended.  Extremities: Extremities normal, atraumatic, no cyanosis or edema  Pulses: 2+ and symmetric  Neurologic: Normal affect, no gross deficits.   Results for orders placed during the hospital encounter of 10/24/13 (from the past 48 hour(s))  BASIC METABOLIC PANEL     Status: Abnormal   Collection Time    10/24/13 12:15 PM      Result Value Ref Range   Sodium 141  137 - 147 mEq/L   Potassium 3.9  3.7 - 5.3 mEq/L   Chloride 103  96 - 112 mEq/L   CO2 26  19 - 32 mEq/L   Glucose, Bld 88  70 - 99 mg/dL   BUN 13  6 - 23 mg/dL  Creatinine, Ser 1.13  0.50 - 1.35 mg/dL   Calcium 9.3  8.4 - 10.5 mg/dL   GFR calc non Af Amer 80 (*) >90 mL/min   GFR calc Af Amer >90  >90 mL/min   Comment: (NOTE)     The eGFR has been calculated using the CKD EPI equation.     This calculation has not been validated in all clinical situations.     eGFR's persistently <90 mL/min signify possible Chronic Kidney     Disease.   Anion gap 12  5 - 15  CBC     Status: Abnormal   Collection Time    10/24/13 12:15 PM      Result Value Ref Range   WBC 4.8  4.0 - 10.5 K/uL   RBC 5.55  4.22 - 5.81 MIL/uL   Hemoglobin 13.6  13.0 - 17.0 g/dL   HCT 39.7  39.0 - 52.0 %   MCV 71.5 (*) 78.0 - 100.0 fL   MCH 24.5 (*) 26.0 - 34.0 pg   MCHC 34.3  30.0 - 36.0 g/dL   RDW 15.0  11.5 - 15.5 %   Platelets 171  150 - 400 K/uL  PROTIME-INR     Status: None   Collection Time    10/24/13 12:15 PM       Result Value Ref Range   Prothrombin Time 12.9  11.6 - 15.2 seconds   INR 0.97  0.00 - 1.49   No results found.  Assessment/Plan Hx of DVT with prior IVC filter placement and lysis For IVC filter retrieval today Discussed procedure,risks, complications, use of sedation Labs reviewed Consent signed in chart  Ascencion Dike PA-C 10/24/2013, 1:04 PM

## 2013-10-24 NOTE — Procedures (Signed)
IVC filter retrieval No comp 

## 2014-02-13 ENCOUNTER — Other Ambulatory Visit: Payer: Self-pay | Admitting: Hematology and Oncology

## 2014-02-27 ENCOUNTER — Other Ambulatory Visit: Payer: Self-pay | Admitting: Hematology and Oncology

## 2014-02-27 DIAGNOSIS — D539 Nutritional anemia, unspecified: Secondary | ICD-10-CM

## 2014-02-27 DIAGNOSIS — N182 Chronic kidney disease, stage 2 (mild): Secondary | ICD-10-CM

## 2014-02-28 ENCOUNTER — Telehealth: Payer: Self-pay | Admitting: Hematology and Oncology

## 2014-02-28 ENCOUNTER — Other Ambulatory Visit (HOSPITAL_BASED_OUTPATIENT_CLINIC_OR_DEPARTMENT_OTHER): Payer: 59

## 2014-02-28 ENCOUNTER — Telehealth: Payer: Self-pay | Admitting: *Deleted

## 2014-02-28 ENCOUNTER — Encounter: Payer: Self-pay | Admitting: Hematology and Oncology

## 2014-02-28 ENCOUNTER — Ambulatory Visit (HOSPITAL_BASED_OUTPATIENT_CLINIC_OR_DEPARTMENT_OTHER): Payer: 59 | Admitting: Hematology and Oncology

## 2014-02-28 VITALS — BP 134/74 | HR 69 | Temp 98.2°F | Resp 18 | Ht 64.0 in | Wt 176.1 lb

## 2014-02-28 DIAGNOSIS — N182 Chronic kidney disease, stage 2 (mild): Secondary | ICD-10-CM

## 2014-02-28 DIAGNOSIS — D582 Other hemoglobinopathies: Secondary | ICD-10-CM

## 2014-02-28 DIAGNOSIS — I82401 Acute embolism and thrombosis of unspecified deep veins of right lower extremity: Secondary | ICD-10-CM

## 2014-02-28 DIAGNOSIS — D539 Nutritional anemia, unspecified: Secondary | ICD-10-CM

## 2014-02-28 LAB — CBC WITH DIFFERENTIAL/PLATELET
BASO%: 0.6 % (ref 0.0–2.0)
Basophils Absolute: 0 10*3/uL (ref 0.0–0.1)
EOS ABS: 0.1 10*3/uL (ref 0.0–0.5)
EOS%: 1 % (ref 0.0–7.0)
HCT: 46.2 % (ref 38.4–49.9)
HEMOGLOBIN: 14.8 g/dL (ref 13.0–17.1)
LYMPH%: 31.6 % (ref 14.0–49.0)
MCH: 24.3 pg — AB (ref 27.2–33.4)
MCHC: 32 g/dL (ref 32.0–36.0)
MCV: 75.9 fL — AB (ref 79.3–98.0)
MONO#: 0.4 10*3/uL (ref 0.1–0.9)
MONO%: 6.6 % (ref 0.0–14.0)
NEUT#: 3.5 10*3/uL (ref 1.5–6.5)
NEUT%: 60.2 % (ref 39.0–75.0)
PLATELETS: 189 10*3/uL (ref 140–400)
RBC: 6.09 10*6/uL — ABNORMAL HIGH (ref 4.20–5.82)
RDW: 14.7 % — AB (ref 11.0–14.6)
WBC: 5.9 10*3/uL (ref 4.0–10.3)
lymph#: 1.9 10*3/uL (ref 0.9–3.3)

## 2014-02-28 LAB — BASIC METABOLIC PANEL (CC13)
ANION GAP: 10 meq/L (ref 3–11)
BUN: 19.8 mg/dL (ref 7.0–26.0)
CALCIUM: 9.2 mg/dL (ref 8.4–10.4)
CO2: 27 meq/L (ref 22–29)
Chloride: 104 mEq/L (ref 98–109)
Creatinine: 1.5 mg/dL — ABNORMAL HIGH (ref 0.7–1.3)
EGFR: 58 mL/min/{1.73_m2} — ABNORMAL LOW (ref >90)
GLUCOSE: 149 mg/dL — AB (ref 70–140)
Potassium: 4 mEq/L (ref 3.5–5.1)
Sodium: 141 mEq/L (ref 136–145)

## 2014-02-28 NOTE — Telephone Encounter (Signed)
Informed pt his lab work shows he is dehydrated.  Instructed him to drink more water.  He verbalized understanding,  States he has been working out and probably not drinking enough.  He will increase his water intake.

## 2014-02-28 NOTE — Assessment & Plan Note (Signed)
The patient had removal of IVC filter. I discussed with the patient the risks, benefits, side effects of maintaining on warfarin versus switching over to Xarelto. He had been on Xarelto for many months with no bleeding complications. Duration of anticoagulation therapies for life. I recommend he follows with PCP in the future with twice a year blood monitoring. If this can be accomplished, he does not need to come back. I will refer him to internal medicine clinic to establish PCP

## 2014-02-28 NOTE — Telephone Encounter (Signed)
-----   Message from Artis DelayNi Gorsuch, MD sent at 02/28/2014  9:16 AM EST ----- Regarding: elevated cr Please let him know Cr is elevated. He needs to drink more water

## 2014-02-28 NOTE — Assessment & Plan Note (Signed)
He is not symptomatic. Recommend observation only. 

## 2014-02-28 NOTE — Progress Notes (Signed)
[place] OFFICE PROGRESS NOTE  [name], [name], MD SUMMARY OF HEMATOLOGIC HISTORY:  In 2012, the patient had trauma to the left lower extremity whereby he hit his left Achilles tendon. Subsequently, he developed acute swelling in the left lower extremity. Ultrasound venous Doppler of bilateral lower extremity on 12/21/2010 showed acute deep vein thrombosis involving the posterior tibial, popliteal, and distal and mid femoral veins of the left lower extremity. There is sluggish flow noted throughout the right lower extremity. CT angiogram perform on 12/21/2010 showed bilateral pulmonary emboli within lobar and segmental branches. He had echocardiogram performed which show no evidence of right ventricular strain. The patient was anticoagulated with warfarin for about a year. He denies any bleeding complications while on warfarin. His anticoagulation therapy was subsequently stopped.  Recently, he complained of an acute onset of right lower extremity swelling and pain. He had been present for several weeks prior to his presentation to the emergency Department. The patient have repeat ultrasound venous Doppler on 05/03/2013 which showed acute deep vein thrombosis involving the posterior tibial, peroneal, popliteal, profunda, femoral , and common femoral veins of the right lower extremity. Diffuse minute flow only noted in the posterior tibial and popliteal veins. There was also evidence of superficial thrombus of the greater saphenous vein from mid thigh to the saphenofemoral junction and including the junction. Due to extensive clots, thrombolytic therapy was performed but it was not helpful to reduce the clot burden.   On 05/03/2013, CT angiogram of the abdomen and pelvis showed from the beginning in the superior segment of the external iliac vein, thrombus is visualized in the right external iliac vein and right common femoral vein extending into the thigh. The common femoral vein is very  distended with clot. Thrombus nearly extends into the right common iliac vein. No thrombus is identified in the IVC. At that point in time, interventional radiologist was consulted and an IVC filter was deployed via the right jugular vein. The reason for placement of IVC filter was unknown but presumably to protect his pulmonary vasculature due to extensive clot burden and prior history of extensive bilateral PE.   In terms of risk factors, He denies recent history of trauma, long distance travel, dehydration, recent surgery, smoking or prolonged immobilization. He had no prior history or diagnosis of cancer. His age appropriate screening programs are up-to-date. The patient had never been testosterone replacement therapy. There is family history of blood clots  in his father.   The patient had screening test for thrombophilia disorder performed while he was hospitalized. He tested negative for protein S deficiency, antithrombin III deficiency, lupus anticoagulant, high homocysteine level, factor V Leiden mutation or prothrombin gene mutation.  The protein C activity and total protein C level were reduced but this not unexpected in the setting of recent clot.   The patient was anticoagulated with heparin, overlap with warfarin and subsequently dismissed with followup here. The patient does not have primary care provider. Of note, the patient was noted to be anemic after his thrombolytic therapy and was subsequently found to have hemoglobin each rate. In September 2015, IVC filter was removed. From September 2015 to present, his anticoagulation therapy is switched to Xarelto.  INTERVAL HISTORY: Tom Dalton 39 y.o. male returns for further follow-up. The patient denies any recent signs or symptoms of bleeding such as spontaneous epistaxis, hematuria or hematochezia.  I have reviewed the past medical history, past surgical history, social history and family history with the patient and they  are unchanged  from previous note.  ALLERGIES:  has No Known Allergies.  MEDICATIONS:  Current Outpatient Prescriptions  Medication Sig Dispense Refill  . diphenhydrAMINE (BENADRYL) 25 MG tablet Take 25 mg by mouth as needed.    . loratadine (CLARITIN) 10 MG tablet Take 10 mg by mouth daily.    Alveda Reasons 20 MG TABS tablet TAKE 1 TABLET BY MOUTH DAILY WITH SUPPER 30 tablet 0   No current facility-administered medications for this visit.     REVIEW OF SYSTEMS:   Constitutional: Denies fevers, chills or night sweats Eyes: Denies blurriness of vision Ears, nose, mouth, throat, and face: Denies mucositis or sore throat Respiratory: Denies cough, dyspnea or wheezes Cardiovascular: Denies palpitation, chest discomfort. He has persistent right lower extremity swelling Gastrointestinal:  Denies nausea, heartburn or change in bowel habits Skin: Denies abnormal skin rashes Lymphatics: Denies new lymphadenopathy or easy bruising Neurological:Denies numbness, tingling or new weaknesses Behavioral/Psych: Mood is stable, no new changes  All other systems were reviewed with the patient and are negative.  PHYSICAL EXAMINATION: ECOG PERFORMANCE STATUS: 0 - Asymptomatic  Filed Vitals:   02/28/14 0827  BP: 134/74  Pulse: 69  Temp: 98.2 F (36.8 C)  Resp: 18   Filed Weights   02/28/14 0827  Weight: 176 lb 1.6 oz (79.878 kg)    GENERAL:alert, no distress and comfortable SKIN: skin color, texture, turgor are normal, no rashes or significant lesions EYES: normal, Conjunctiva are pink and non-injected, sclera clear OROPHARYNX:no exudate, no erythema and lips, buccal mucosa, and tongue normal  NECK: supple, thyroid normal size, non-tender, without nodularity LYMPH:  no palpable lymphadenopathy in the cervical, axillary or inguinal LUNGS: clear to auscultation and percussion with normal breathing effort HEART: regular rate & rhythm and no murmurs and no lower extremity edema ABDOMEN:abdomen soft, non-tender  and normal bowel sounds Musculoskeletal:no cyanosis of digits and no clubbing  NEURO: alert & oriented x 3 with fluent speech, no focal motor/sensory deficits  LABORATORY DATA:  I have reviewed the data as listed Results for orders placed or performed in visit on 02/28/14 (from the past 48 hour(s))  CBC with Differential     Status: Abnormal   Collection Time: 02/28/14  8:13 AM  Result Value Ref Range   WBC 5.9 4.0 - 10.3 10e3/uL   NEUT# 3.5 1.5 - 6.5 10e3/uL   HGB 14.8 13.0 - 17.1 g/dL   HCT 46.2 38.4 - 49.9 %   Platelets 189 140 - 400 10e3/uL   MCV 75.9 (L) 79.3 - 98.0 fL   MCH 24.3 (L) 27.2 - 33.4 pg   MCHC 32.0 32.0 - 36.0 g/dL   RBC 6.09 (H) 4.20 - 5.82 10e6/uL   RDW 14.7 (H) 11.0 - 14.6 %   lymph# 1.9 0.9 - 3.3 10e3/uL   MONO# 0.4 0.1 - 0.9 10e3/uL   Eosinophils Absolute 0.1 0.0 - 0.5 10e3/uL   Basophils Absolute 0.0 0.0 - 0.1 10e3/uL   NEUT% 60.2 39.0 - 75.0 %   LYMPH% 31.6 14.0 - 49.0 %   MONO% 6.6 0.0 - 14.0 %   EOS% 1.0 0.0 - 7.0 %   BASO% 0.6 0.0 - 2.0 %  Basic metabolic panel     Status: Abnormal   Collection Time: 02/28/14  8:13 AM  Result Value Ref Range   Sodium 141 136 - 145 mEq/L   Potassium 4.0 3.5 - 5.1 mEq/L   Chloride 104 98 - 109 mEq/L   CO2 27 22 - 29 mEq/L  Glucose 149 (H) 70 - 140 mg/dl   BUN 19.8 7.0 - 26.0 mg/dL   Creatinine 1.5 (H) 0.7 - 1.3 mg/dL   Calcium 9.2 8.4 - 10.4 mg/dL   Anion Gap 10 3 - 11 mEq/L   EGFR 58 (L) >90 ml/min/1.73 m2    Comment: eGFR is calculated using the CKD-EPI Creatinine Equation (2009)    Lab Results  Component Value Date   WBC 5.9 02/28/2014   HGB 14.8 02/28/2014   HCT 46.2 02/28/2014   MCV 75.9* 02/28/2014   PLT 189 02/28/2014  ASSESSMENT & PLAN:  Right leg DVT The patient had removal of IVC filter. I discussed with the patient the risks, benefits, side effects of maintaining on warfarin versus switching over to Xarelto. He had been on Xarelto for many months with no bleeding complications. Duration of  anticoagulation therapies for life. I recommend he follows with PCP in the future with twice a year blood monitoring. If this can be accomplished, he does not need to come back. I will refer him to internal medicine clinic to establish PCP   CKD (chronic kidney disease) stage 2, GFR 60-89 ml/min His creatinine fluctuated in the past. 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 Recommend observation only.    All questions were answered. The patient knows to call the clinic with any problems, questions or concerns. No barriers to learning was detected.  I spent 15 minutes counseling the patient face to face. The total time spent in the appointment was 20 minutes and more than 50% was on counseling.     Mariya Mottley, MD _0 (<PARAMETER> error)@ 10:57 AM

## 2014-02-28 NOTE — Telephone Encounter (Signed)
Pt confirmed labs/ov per 01/13 POF, gave pt AVS.... KJ, also gave pt information pertaining to referral PCP and sent msg to MD confirming new D/T due to labs in meeting from 8-8:30.Marland Kitchen..Marland Kitchen

## 2014-02-28 NOTE — Assessment & Plan Note (Signed)
His creatinine fluctuated in the past. Recommend increase oral fluid hydration

## 2014-03-21 ENCOUNTER — Telehealth: Payer: Self-pay | Admitting: *Deleted

## 2014-03-21 ENCOUNTER — Encounter: Payer: Self-pay | Admitting: Hematology and Oncology

## 2014-03-21 ENCOUNTER — Other Ambulatory Visit: Payer: Self-pay | Admitting: *Deleted

## 2014-03-21 MED ORDER — RIVAROXABAN 20 MG PO TABS: ORAL_TABLET | ORAL | Status: DC

## 2014-03-21 NOTE — Progress Notes (Signed)
Faxed xarelto pa form to [facility] °

## 2014-03-21 NOTE — Telephone Encounter (Signed)
T[facility] auth [facility]

## 2014-03-23 ENCOUNTER — Encounter: Payer: Self-pay | Admitting: *Deleted

## 2014-03-23 NOTE — Progress Notes (Signed)
[facility] A PRIOR AUTHOR[facility]ATION FOR XARELTO. THIS REQUEST WAS PLACED IN THE MANAGED CARE BIN.

## 2014-04-10 ENCOUNTER — Ambulatory Visit (INDEPENDENT_AMBULATORY_CARE_PROVIDER_SITE_OTHER): Payer: 59 | Admitting: Family

## 2014-04-10 ENCOUNTER — Encounter: Payer: Self-pay | Admitting: Family

## 2014-04-10 VITALS — BP 142/80 | HR 79 | Temp 98.5°F | Resp 18 | Ht 64.0 in | Wt 176.8 lb

## 2014-04-10 DIAGNOSIS — I82401 Acute embolism and thrombosis of unspecified deep veins of right lower extremity: Secondary | ICD-10-CM

## 2014-04-10 DIAGNOSIS — Z0000 Encounter for general adult medical examination without abnormal findings: Secondary | ICD-10-CM

## 2014-04-10 NOTE — Progress Notes (Signed)
Pre visit review using our clinic review tool, if applicable. No additional management support is needed unless otherwise documented below in the visit note. 

## 2014-04-10 NOTE — Assessment & Plan Note (Signed)
1) Anticipatory Guidance: Discussed importance of wearing a seatbelt while driving and not texting while driving; changing batteries in smoke detector at least once annually; wearing suntan lotion when outside; eating a balanced and moderate diet; getting physical activity at least 30 minutes per day.  2) Immunizations / Screenings / Labs:  Patient declines flu shot. All other immunizations are up-to-date per recommendations. All screenings are up-to-date per recommendations.Obtain A1c, Lipid profile and TSH.   Overall well exam. Blood pressure slightly elevated today. Patient does have increased risk factor of previous deep vein thrombophlebitis which is currently stable and being treated with a relative. Continue to monitor his blood pressure at this time. Continue healthy lifestyle behaviors. Follow up prevention exam in one year.

## 2014-04-10 NOTE — Progress Notes (Signed)
Subjective:    Patient ID: Tom Dalton, male    DOB: 07-Sep-1975, 39 y.o.   MRN: [id number]  Chief Complaint  Patient presents with  . Establish Care    has had blood clot 2 times, says he was sent over from across the street    HPI:  Tom Dalton is a 39 y.o. male who presents today for an annual wellness visit.   1) Health Maintenance - Overall feels okay.   Diet - Averages about 5-6 small meals per day; Includes fruits and vegetables; 1 cup of caffeine daily  Exercise - Goes to the gym 5-6 per week.    2) Preventative Exams / Immunizations:  Dental -- Need for exam  Vision -- Need for exam   Health Maintenance  Topic Date Due  . INFLUENZA VACCINE  04/11/2015 (Originally 09/16/2013)  . TETANUS/TDAP  04/12/2023    Immunization History  Administered Date(s) Administered  . Pneumococcal Polysaccharide-23 12/24/2010     Review of Systems  Constitutional: Denies fever, chills, fatigue, or significant weight gain/loss. HENT: Head: Denies headache or neck pain Ears: Denies changes in hearing, ringing in ears, earache, drainage Nose: Denies discharge, stuffiness, itching, nosebleed, sinus pain Throat: Denies sore throat, hoarseness, dry mouth, sores, thrush Eyes: Denies loss/changes in vision, pain, redness, blurry/double vision, flashing lights Cardiovascular: Denies chest pain/discomfort, tightness, palpitations, shortness of breath with activity, difficulty lying down, swelling, sudden awakening with shortness of breath Respiratory: Denies shortness of breath, cough, sputum production, wheezing Gastrointestinal: Denies dysphasia, heartburn, change in appetite, nausea, change in bowel habits, rectal bleeding, constipation, diarrhea, yellow skin or eyes Genitourinary: Denies frequency, urgency, burning/pain, blood in urine, incontinence, change in urinary strength. Musculoskeletal: Denies muscle/joint pain, stiffness, back pain, redness or swelling of joints, trauma Skin:  Denies rashes, lumps, itching, dryness, color changes, or hair/nail changes Neurological: Denies dizziness, fainting, seizures, weakness, numbness, tingling, tremor Psychiatric - Denies nervousness, stress, depression or memory loss Endocrine: Denies heat or cold intolerance, sweating, frequent urination, excessive thirst, changes in appetite Hematologic: Denies ease of bruising or bleeding     Objective:     BP 142/80 mmHg  Pulse 79  Temp(Src) 98.5 F (36.9 C) (Oral)  Resp 18  Ht  (1.626 m)  Wt 176 lb 12.8 oz (80.196 kg)  BMI 30.33 kg/m2  SpO2 98% Nursing note and vital signs reviewed.  Physical Exam  Constitutional: He is oriented to person, place, and time. He appears well-developed and well-nourished.  HENT:  Head: Normocephalic.  Right Ear: Hearing, tympanic membrane, external ear and ear canal normal.  Left Ear: Hearing, tympanic membrane, external ear and ear canal normal.  Nose: Nose normal.  Mouth/Throat: Uvula is midline, oropharynx is clear and moist and mucous membranes are normal.  Eyes: Conjunctivae and EOM are normal. Pupils are equal, round, and reactive to light.  Neck: Neck supple. No JVD present. No tracheal deviation present. No thyromegaly present.  Cardiovascular: Normal rate, regular rhythm, normal heart sounds and intact distal pulses.   Pulmonary/Chest: Effort normal and breath sounds normal.  Abdominal: Soft. Bowel sounds are normal. He exhibits no distension and no mass. There is no tenderness. There is no rebound and no guarding.  Musculoskeletal: Normal range of motion. He exhibits no edema or tenderness.  Lymphadenopathy:    He has no cervical adenopathy.  Neurological: He is alert and oriented to person, place, and time. He has normal reflexes. No cranial nerve deficit. He exhibits normal muscle tone. Coordination normal.  Skin: Skin is  warm and dry.  Psychiatric: He has a normal mood and affect. His behavior is normal. Judgment and thought  content normal.       Assessment & Plan:

## 2014-04-10 NOTE — Patient Instructions (Signed)
Thank you for choosing [facility].  Summary/Instructions:  Your prescription(s) have been submitted to your pharmacy or been printed and provided for you. Please take as directed and contact our office if you believe you are having problem(s) with the medication(s) or have any questions.  Please stop by the lab on the basement level of the building for your blood work when you are fasting. No appointment is needed. Prior to the test only black coffee and water.  Your results will be released to MyChart (or called to you) after review, usually within 72 hours after test completion. If any changes need to be made, you will be notified at that same time.  Health Maintenance A healthy lifestyle and preventative care can promote health and wellness.  Maintain regular health, dental, and eye exams.  Eat a healthy diet. Foods like vegetables, fruits, whole grains, low-fat dairy products, and lean protein foods contain the nutrients you need and are low in calories. Decrease your intake of foods high in solid fats, added sugars, and salt. Get information about a proper diet from your health care provider, if necessary.  Regular physical exercise is one of the most important things you can do for your health. Most adults should get at least 150 minutes of moderate-intensity exercise (any activity that increases your heart rate and causes you to sweat) each week. In addition, most adults need muscle-strengthening exercises on 2 or more days a week.   Maintain a healthy weight. The body mass index (BMI) is a screening tool to identify possible weight problems. It provides an estimate of body fat based on height and weight. Your health care provider can find your BMI and can help you achieve or maintain a healthy weight. For males 20 years and older:  A BMI below 18.5 is considered underweight.  A BMI of 18.5 to 24.9 is normal.  A BMI of 25 to 29.9 is considered overweight.  A BMI of 30 and  above is considered obese.  Maintain normal blood lipids and cholesterol by exercising and minimizing your intake of saturated fat. Eat a balanced diet with plenty of fruits and vegetables. Blood tests for lipids and cholesterol should begin at age 40 and be repeated every 5 years. If your lipid or cholesterol levels are high, you are over age 40, or you are at high risk for heart disease, you may need your cholesterol levels checked more frequently.Ongoing high lipid and cholesterol levels should be treated with medicines if diet and exercise are not working.  If you smoke, find out from your health care provider how to quit. If you do not use tobacco, do not start.  Lung cancer screening is recommended for adults aged 55-80 years who are at high risk for developing lung cancer because of a history of smoking. A yearly low-dose CT scan of the lungs is recommended for people who have at least a 30-pack-year history of smoking and are current smokers or have quit within the past 15 years. A pack year of smoking is smoking an average of 1 pack of cigarettes a day for 1 year (for example, a 30-pack-year history of smoking could mean smoking 1 pack a day for 30 years or 2 packs a day for 15 years). Yearly screening should continue until the smoker has stopped smoking for at least 15 years. Yearly screening should be stopped for people who develop a health problem that would prevent them from having lung cancer treatment.  If you choose to  drink alcohol, do not have more than 2 drinks per day. One drink is considered to be 12 oz (360 mL) of beer, 5 oz (150 mL) of wine, or 1.5 oz (45 mL) of liquor.  Avoid the use of street drugs. Do not share needles with anyone. Ask for help if you need support or instructions about stopping the use of drugs.  High blood pressure causes heart disease and increases the risk of stroke. Blood pressure should be checked at least every 1-2 years. Ongoing high blood pressure  should be treated with medicines if weight loss and exercise are not effective.  If you are 24-36 years old, ask your health care provider if you should take aspirin to prevent heart disease.  Diabetes screening involves taking a blood sample to check your fasting blood sugar level. This should be done once every 3 years after age 60 if you are at a normal weight and without risk factors for diabetes. Testing should be considered at a younger age or be carried out more frequently if you are overweight and have at least 1 risk factor for diabetes.  Colorectal cancer can be detected and often prevented. Most routine colorectal cancer screening begins at the age of 72 and continues through age 85. However, your health care provider may recommend screening at an earlier age if you have risk factors for colon cancer. On a yearly basis, your health care provider may provide home test kits to check for hidden blood in the stool. A small camera at the end of a tube may be used to directly examine the colon (sigmoidoscopy or colonoscopy) to detect the earliest forms of colorectal cancer. Talk to your health care provider about this at age 44 when routine screening begins. A direct exam of the colon should be repeated every 5-10 years through age 36, unless early forms of precancerous polyps or small growths are found.  People who are at an increased risk for hepatitis B should be screened for this virus. You are considered at high risk for hepatitis B if:  You were born in a country where hepatitis B occurs often. Talk with your health care provider about which countries are considered high risk.  Your parents were born in a high-risk country and you have not received a shot to protect against hepatitis B (hepatitis B vaccine).  You have HIV or AIDS.  You use needles to inject street drugs.  You live with, or have sex with, someone who has hepatitis B.  You are a man who has sex with other men  (MSM).  You get hemodialysis treatment.  You take certain medicines for conditions like cancer, organ transplantation, and autoimmune conditions.  Hepatitis C blood testing is recommended for all people born from [birth year] through 1966 and any individual with known risk factors for hepatitis C.  Healthy men should no longer receive prostate-specific antigen (PSA) blood tests as part of routine cancer screening. Talk to your health care provider about prostate cancer screening.  Testicular cancer screening is not recommended for adolescents or adult males who have no symptoms. Screening includes self-exam, a health care provider exam, and other screening tests. Consult with your health care provider about any symptoms you have or any concerns you have about testicular cancer.  Practice safe sex. Use condoms and avoid high-risk sexual practices to reduce the spread of sexually transmitted infections (STIs).  You should be screened for STIs, including gonorrhea and chlamydia if:  You are sexually active and  are younger than 24 years.  You are older than 24 years, and your health care provider tells you that you are at risk for this type of infection.  Your sexual activity has changed since you were last screened, and you are at an increased risk for chlamydia or gonorrhea. Ask your health care provider if you are at risk.  If you are at risk of being infected with HIV, it is recommended that you take a prescription medicine daily to prevent HIV infection. This is called pre-exposure prophylaxis (PrEP). You are considered at risk if:  You are a man who has sex with other men (MSM).  You are a heterosexual man who is sexually active with multiple partners.  You take drugs by injection.  You are sexually active with a partner who has HIV.  Talk with your health care provider about whether you are at high risk of being infected with HIV. If you choose to begin PrEP, you should first be tested for  HIV. You should then be tested every 3 months for as long as you are taking PrEP.  Use sunscreen. Apply sunscreen liberally and repeatedly throughout the day. You should seek shade when your shadow is shorter than you. Protect yourself by wearing long sleeves, pants, a wide-brimmed hat, and sunglasses year round whenever you are outdoors.  Tell your health care provider of new moles or changes in moles, especially if there is a change in shape or color. Also, tell your health care provider if a mole is larger than the size of a pencil eraser.  A one-time screening for abdominal aortic aneurysm (AAA) and surgical repair of large AAAs by ultrasound is recommended for men aged 65-75 years who are current or former smokers.  Stay current with your vaccines (immunizations). Document Released: 08/01/2007 Document Revised: 02/07/2013 Document Reviewed: 06/30/2010 [facility] Patient Information 2015 [place], [place]. This information is not intended to replace advice given to you by your health care provider. Make sure you discuss any questions you have with your health care provider.

## 2014-04-10 NOTE — Assessment & Plan Note (Signed)
Stable and maintained on Xarelto and followed by Dr. Bertis RuddyGorsuch. Continue Xarelto at current dosage.

## 2014-04-19 ENCOUNTER — Telehealth: Payer: Self-pay | Admitting: Family

## 2014-04-19 MED ORDER — RIVAROXABAN 20 MG PO TABS: ORAL_TABLET | ORAL | Status: DC

## 2014-04-19 NOTE — Telephone Encounter (Signed)
Medication refilled

## 2014-04-19 NOTE — Telephone Encounter (Signed)
Pt request refill for rivaroxaban (XARELTO) 20 MG [facility]

## 2014-04-19 NOTE — Telephone Encounter (Signed)
LVM for pt making him aware

## 2014-04-19 NOTE — Telephone Encounter (Signed)
Ok to refill 

## 2014-04-23 ENCOUNTER — Telehealth: Payer: Self-pay | Admitting: Family

## 2014-04-23 NOTE — Telephone Encounter (Signed)
Pt called in said that ins is need Pa on the recent meds that was given.  She is having pharmacy fax over the PA

## 2014-04-24 NOTE — Telephone Encounter (Signed)
Received PA from pharmacy. Tried filling it out and sending it online and it would not go through. I called the number given to see if it needed to be done over the phone instead. It was stated through [facility] that he did not need a PA that the xarelto was accepted. He just can not get a refill until 05/16/2014. Tried calling pt to make him aware. LVM for him to call back.

## 2014-06-08 ENCOUNTER — Telehealth: Payer: Self-pay | Admitting: Family

## 2014-06-08 MED ORDER — RIVAROXABAN 20 MG PO TABS: ORAL_TABLET | ORAL | Status: DC

## 2014-06-08 NOTE — Telephone Encounter (Signed)
Medication sent.

## 2014-06-08 NOTE — Telephone Encounter (Signed)
Patient is requesting refill on xarelto.  [facility] on [address]

## 2014-08-21 ENCOUNTER — Other Ambulatory Visit: Payer: Self-pay | Admitting: Family

## 2014-08-28 ENCOUNTER — Ambulatory Visit (HOSPITAL_BASED_OUTPATIENT_CLINIC_OR_DEPARTMENT_OTHER): Payer: 59 | Admitting: Hematology and Oncology

## 2014-08-28 ENCOUNTER — Other Ambulatory Visit (HOSPITAL_BASED_OUTPATIENT_CLINIC_OR_DEPARTMENT_OTHER): Payer: 59

## 2014-08-28 ENCOUNTER — Encounter: Payer: Self-pay | Admitting: Hematology and Oncology

## 2014-08-28 VITALS — BP 121/68 | HR 67 | Temp 98.5°F | Resp 18 | Ht 64.0 in | Wt 171.7 lb

## 2014-08-28 DIAGNOSIS — Z7901 Long term (current) use of anticoagulants: Secondary | ICD-10-CM

## 2014-08-28 DIAGNOSIS — D582 Other hemoglobinopathies: Secondary | ICD-10-CM

## 2014-08-28 DIAGNOSIS — N182 Chronic kidney disease, stage 2 (mild): Secondary | ICD-10-CM

## 2014-08-28 DIAGNOSIS — I82401 Acute embolism and thrombosis of unspecified deep veins of right lower extremity: Secondary | ICD-10-CM | POA: Diagnosis not present

## 2014-08-28 LAB — CBC WITH DIFFERENTIAL/PLATELET
BASO%: 0.5 % (ref 0.0–2.0)
Basophils Absolute: 0 10*3/uL (ref 0.0–0.1)
EOS%: 1.9 % (ref 0.0–7.0)
Eosinophils Absolute: 0.1 10*3/uL (ref 0.0–0.5)
HCT: 45.9 % (ref 38.4–49.9)
HEMOGLOBIN: 15.2 g/dL (ref 13.0–17.1)
LYMPH%: 35.5 % (ref 14.0–49.0)
MCH: 25.2 pg — ABNORMAL LOW (ref 27.2–33.4)
MCHC: 33.1 g/dL (ref 32.0–36.0)
MCV: 76.1 fL — AB (ref 79.3–98.0)
MONO#: 0.4 10*3/uL (ref 0.1–0.9)
MONO%: 7.4 % (ref 0.0–14.0)
NEUT#: 3.2 10*3/uL (ref 1.5–6.5)
NEUT%: 54.7 % (ref 39.0–75.0)
Platelets: 181 10*3/uL (ref 140–400)
RBC: 6.03 10*6/uL — AB (ref 4.20–5.82)
RDW: 14.2 % (ref 11.0–14.6)
WBC: 5.9 10*3/uL (ref 4.0–10.3)
lymph#: 2.1 10*3/uL (ref 0.9–3.3)

## 2014-08-28 LAB — BASIC METABOLIC PANEL (CC13)
Anion Gap: 9 mEq/L (ref 3–11)
BUN: 21 mg/dL (ref 7.0–26.0)
CALCIUM: 9.2 mg/dL (ref 8.4–10.4)
CO2: 24 meq/L (ref 22–29)
Chloride: 106 mEq/L (ref 98–109)
Creatinine: 1.2 mg/dL (ref 0.7–1.3)
EGFR: 74 mL/min/{1.73_m2} — ABNORMAL LOW (ref >90)
Glucose: 104 mg/dl (ref 70–140)
Potassium: 3.9 mEq/L (ref 3.5–5.1)
Sodium: 139 mEq/L (ref 136–145)

## 2014-08-28 NOTE — Assessment & Plan Note (Signed)
His creatinine fluctuated in the past. Recommend increase oral fluid hydration and close monitoring.

## 2014-08-28 NOTE — Progress Notes (Signed)
[place] OFFICE PROGRESS NOTE  Tom Po, FNP SUMMARY OF HEMATOLOGIC HISTORY:  In 2012, the patient had trauma to the left lower extremity whereby he hit his left Achilles tendon. Subsequently, he developed acute swelling in the left lower extremity. Ultrasound venous Doppler of bilateral lower extremity on 12/21/2010 showed acute deep vein thrombosis involving the posterior tibial, popliteal, and distal and mid femoral veins of the left lower extremity. There is sluggish flow noted throughout the right lower extremity. CT angiogram perform on 12/21/2010 showed bilateral pulmonary emboli within lobar and segmental branches. He had echocardiogram performed which show no evidence of right ventricular strain. The patient was anticoagulated with warfarin for about a year. He denies any bleeding complications while on warfarin. His anticoagulation therapy was subsequently stopped.  Recently, he complained of an acute onset of right lower extremity swelling and pain. He had been present for several weeks prior to his presentation to the emergency Department. The patient have repeat ultrasound venous Doppler on 05/03/2013 which showed acute deep vein thrombosis involving the posterior tibial, peroneal, popliteal, profunda, femoral , and common femoral veins of the right lower extremity. Diffuse minute flow only noted in the posterior tibial and popliteal veins. There was also evidence of superficial thrombus of the greater saphenous vein from mid thigh to the saphenofemoral junction and including the junction. Due to extensive clots, thrombolytic therapy was performed but it was not helpful to reduce the clot burden.   On 05/03/2013, CT angiogram of the abdomen and pelvis showed from the beginning in the superior segment of the external iliac vein, thrombus is visualized in the right external iliac vein and right common femoral vein extending into the thigh. The common femoral vein is very  distended with clot. Thrombus nearly extends into the right common iliac vein. No thrombus is identified in the IVC. At that point in time, interventional radiologist was consulted and an IVC filter was deployed via the right jugular vein. The reason for placement of IVC filter was unknown but presumably to protect his pulmonary vasculature due to extensive clot burden and prior history of extensive bilateral PE.   In terms of risk factors, He denies recent history of trauma, long distance travel, dehydration, recent surgery, smoking or prolonged immobilization. He had no prior history or diagnosis of cancer. His age appropriate screening programs are up-to-date. The patient had never been testosterone replacement therapy. There is family history of blood clots  in his father.   The patient had screening test for thrombophilia disorder performed while he was hospitalized. He tested negative for protein S deficiency, antithrombin III deficiency, lupus anticoagulant, high homocysteine level, factor V Leiden mutation or prothrombin gene mutation.  The protein C activity and total protein C level were reduced but this not unexpected in the setting of recent clot.   The patient was anticoagulated with heparin, overlap with warfarin and subsequently dismissed with followup here. The patient does not have primary care provider. Of note, the patient was noted to be anemic after his thrombolytic therapy and was subsequently found to have hemoglobin each rate. In September 2015, IVC filter was removed. From September 2015 to present, his anticoagulation therapy is switched to Xarelto.  INTERVAL HISTORY: Tom Dalton 39 y.o. male returns for further follow-up. He feels well. The patient denies any recent signs or symptoms of bleeding such as spontaneous epistaxis, hematuria or hematochezia.  I have reviewed the past medical history, past surgical history, social history and family history with the  patient and  they are unchanged from previous note.  ALLERGIES:  has No Known Allergies.  MEDICATIONS:  Current Outpatient Prescriptions  Medication Sig Dispense Refill  . diphenhydrAMINE (BENADRYL) 25 MG tablet Take 25 mg by mouth as needed.    Alveda Reasons 20 MG TABS tablet TAKE 1 TABLET BY MOUTH DAILY WITH DINNER 30 tablet 0   No current facility-administered medications for this visit.     REVIEW OF SYSTEMS:   Constitutional: Denies fevers, chills or night sweats Eyes: Denies blurriness of vision Ears, nose, mouth, throat, and face: Denies mucositis or sore throat Respiratory: Denies cough, dyspnea or wheezes Cardiovascular: Denies palpitation, chest discomfort or lower extremity swelling Gastrointestinal:  Denies nausea, heartburn or change in bowel habits Skin: Denies abnormal skin rashes Lymphatics: Denies new lymphadenopathy or easy bruising Neurological:Denies numbness, tingling or new weaknesses Behavioral/Psych: Mood is stable, no new changes  All other systems were reviewed with the patient and are negative.  PHYSICAL EXAMINATION: ECOG PERFORMANCE STATUS: 0 - Asymptomatic  Filed Vitals:   08/28/14 0901  BP: 121/68  Pulse: 67  Temp: 98.5 F (36.9 C)  Resp: 18   Filed Weights   08/28/14 0901  Weight: 171 lb 11.2 oz (77.883 kg)    GENERAL:alert, no distress and comfortable SKIN: skin color, texture, turgor are normal, no rashes or significant lesions EYES: normal, Conjunctiva are pink and non-injected, sclera clear HEART: Noted mild right lower extremity varicose vein around the ankle Musculoskeletal:no cyanosis of digits and no clubbing  NEURO: alert & oriented x 3 with fluent speech, no focal motor/sensory deficits  LABORATORY DATA:  I have reviewed the data as listed Results for orders placed or performed in visit on 08/28/14 (from the past 48 hour(s))  CBC with Differential     Status: Abnormal   Collection Time: 08/28/14  8:48 AM  Result Value Ref Range   WBC 5.9  4.0 - 10.3 10e3/uL   NEUT# 3.2 1.5 - 6.5 10e3/uL   HGB 15.2 13.0 - 17.1 g/dL   HCT 45.9 38.4 - 49.9 %   Platelets 181 140 - 400 10e3/uL   MCV 76.1 (L) 79.3 - 98.0 fL   MCH 25.2 (L) 27.2 - 33.4 pg   MCHC 33.1 32.0 - 36.0 g/dL   RBC 6.03 (H) 4.20 - 5.82 10e6/uL   RDW 14.2 11.0 - 14.6 %   lymph# 2.1 0.9 - 3.3 10e3/uL   MONO# 0.4 0.1 - 0.9 10e3/uL   Eosinophils Absolute 0.1 0.0 - 0.5 10e3/uL   Basophils Absolute 0.0 0.0 - 0.1 10e3/uL   NEUT% 54.7 39.0 - 75.0 %   LYMPH% 35.5 14.0 - 49.0 %   MONO% 7.4 0.0 - 14.0 %   EOS% 1.9 0.0 - 7.0 %   BASO% 0.5 0.0 - 2.0 %  Basic metabolic panel     Status: Abnormal   Collection Time: 08/28/14  8:48 AM  Result Value Ref Range   Sodium 139 136 - 145 mEq/L   Potassium 3.9 3.5 - 5.1 mEq/L   Chloride 106 98 - 109 mEq/L   CO2 24 22 - 29 mEq/L   Glucose 104 70 - 140 mg/dl   BUN 21.0 7.0 - 26.0 mg/dL   Creatinine 1.2 0.7 - 1.3 mg/dL   Calcium 9.2 8.4 - 10.4 mg/dL   Anion Gap 9 3 - 11 mEq/L   EGFR 74 (L) >90 ml/min/1.73 m2    Comment: eGFR is calculated using the CKD-EPI Creatinine Equation (2009)    Lab  Results  Component Value Date   WBC 5.9 08/28/2014   HGB 15.2 08/28/2014   HCT 45.9 08/28/2014   MCV 76.1* 08/28/2014   PLT 181 08/28/2014    ASSESSMENT & PLAN:  Right leg DVT The patient had removal of IVC filter. I discussed with the patient the risks, benefits, side effects of maintaining on warfarin versus switching over to Xarelto. He had been on Xarelto for many months with no bleeding complications. Duration of anticoagulation therapies for life. I recommend he follows with PCP in the future with twice a year blood monitoring.  I will discharge him from the clinic.  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 Recommend observation only.    CKD (chronic kidney disease) stage 2, GFR 60-89 ml/min His creatinine fluctuated in the past. Recommend increase oral fluid hydration and close monitoring.     All questions were answered.  The patient knows to call the clinic with any problems, questions or concerns. No barriers to learning was detected.  I spent 15 minutes counseling the patient face to face. The total time spent in the appointment was 20 minutes and more than 50% was on counseling.     [facility], Lavontay Kirk, MD 7/12/201610:02 AM

## 2014-08-28 NOTE — Assessment & Plan Note (Signed)
He is not symptomatic. Recommend observation only. 

## 2014-08-28 NOTE — Assessment & Plan Note (Signed)
The patient had removal of IVC filter. I discussed with the patient the risks, benefits, side effects of maintaining on warfarin versus switching over to Xarelto. He had been on Xarelto for many months with no bleeding complications. Duration of anticoagulation therapies for life. I recommend he follows with PCP in the future with twice a year blood monitoring.  I will discharge him from the clinic.

## 2014-09-25 ENCOUNTER — Other Ambulatory Visit: Payer: Self-pay | Admitting: Family

## 2014-10-26 ENCOUNTER — Other Ambulatory Visit: Payer: Self-pay | Admitting: Family

## 2014-11-29 ENCOUNTER — Other Ambulatory Visit: Payer: Self-pay | Admitting: Family

## 2015-01-01 ENCOUNTER — Other Ambulatory Visit: Payer: Self-pay | Admitting: Family

## 2015-01-28 ENCOUNTER — Other Ambulatory Visit: Payer: Self-pay | Admitting: Family

## 2015-02-28 ENCOUNTER — Other Ambulatory Visit: Payer: Self-pay | Admitting: Family

## 2015-03-01 NOTE — Telephone Encounter (Signed)
Pt has not been seen since last February. Would you like me to get pt to come back in for an appointment?

## 2015-03-04 NOTE — Telephone Encounter (Signed)
Please have him schedule a follow up - 1 month prescription sent in to pharmacy.

## 2015-04-01 ENCOUNTER — Other Ambulatory Visit: Payer: Self-pay

## 2015-04-01 MED ORDER — RIVAROXABAN 20 MG PO TABS: ORAL_TABLET | ORAL | Status: DC

## 2015-04-02 ENCOUNTER — Other Ambulatory Visit (INDEPENDENT_AMBULATORY_CARE_PROVIDER_SITE_OTHER): Payer: BLUE CROSS/BLUE SHIELD

## 2015-04-02 ENCOUNTER — Encounter: Payer: Self-pay | Admitting: Family

## 2015-04-02 ENCOUNTER — Ambulatory Visit (INDEPENDENT_AMBULATORY_CARE_PROVIDER_SITE_OTHER): Payer: BLUE CROSS/BLUE SHIELD | Admitting: Family

## 2015-04-02 VITALS — BP 110/80 | HR 66 | Temp 98.2°F | Resp 16 | Ht 64.0 in | Wt 172.0 lb

## 2015-04-02 DIAGNOSIS — I824Y1 Acute embolism and thrombosis of unspecified deep veins of right proximal lower extremity: Secondary | ICD-10-CM

## 2015-04-02 DIAGNOSIS — Z5181 Encounter for therapeutic drug level monitoring: Secondary | ICD-10-CM

## 2015-04-02 DIAGNOSIS — Z0000 Encounter for general adult medical examination without abnormal findings: Secondary | ICD-10-CM | POA: Diagnosis not present

## 2015-04-02 LAB — CBC
HCT: 45.1 % (ref 39.0–52.0)
HEMOGLOBIN: 14.9 g/dL (ref 13.0–17.0)
MCHC: 33.1 g/dL (ref 30.0–36.0)
MCV: 76.7 fl — AB (ref 78.0–100.0)
PLATELETS: 214 10*3/uL (ref 150.0–400.0)
RBC: 5.88 Mil/uL — AB (ref 4.22–5.81)
RDW: 14.1 % (ref 11.5–15.5)
WBC: 6.8 10*3/uL (ref 4.0–10.5)

## 2015-04-02 LAB — HEPATIC FUNCTION PANEL
ALBUMIN: 4.7 g/dL (ref 3.5–5.2)
ALK PHOS: 47 U/L (ref 39–117)
ALT: 43 U/L (ref 0–53)
AST: 32 U/L (ref 0–37)
BILIRUBIN DIRECT: 0.3 mg/dL (ref 0.0–0.3)
TOTAL PROTEIN: 7.9 g/dL (ref 6.0–8.3)
Total Bilirubin: 2.2 mg/dL — ABNORMAL HIGH (ref 0.2–1.2)

## 2015-04-02 LAB — LIPID PANEL
CHOLESTEROL: 190 mg/dL (ref 0–200)
HDL: 51.3 mg/dL (ref >39.00)
LDL CALC: 101 mg/dL — AB (ref 0–99)
NonHDL: 138.2
TRIGLYCERIDES: 184 mg/dL — AB (ref 0.0–149.0)
Total CHOL/HDL Ratio: 4
VLDL: 36.8 mg/dL (ref 0.0–40.0)

## 2015-04-02 LAB — TSH: TSH: 0.61 u[IU]/mL (ref 0.35–4.50)

## 2015-04-02 LAB — HEMOGLOBIN A1C: HEMOGLOBIN A1C: 4.6 % (ref 4.6–6.5)

## 2015-04-02 NOTE — Progress Notes (Signed)
   Subjective:    Patient ID: Tom Dalton, male    DOB: November 04, 1975, 40 y.o.   MRN: [id number]  Chief Complaint  Patient presents with  . Follow-up    xarelto follow up, still having some swelling in his right leg    HPI:  Tom Dalton is a 40 y.o. male who  has a past medical history of Tobacco abuse; Headache(784.0); Pneumonia; Acute renal failure (HCC) (12/24/2010); Blood clot in vein; Right leg DVT (HCC) (05/03/2013); Leg pain (05/17/2013); Unspecified deficiency anemia (05/17/2013); ETOH abuse; and Allergy. and presents today for a follow up office visit.  1.) DVT - Previously seen by hematology and noted that he would need life-long anticoagulation. He is currently maintained on Xarelto. Reports taking the medication as prescribed and denies adverse effects or nuisance bleeding. Continues to experience the associated symptoms of swelling located in his right lower leg. Last venous doppler was completed on 05/03/13. There is some pain with sitting for periods of time and relieved by walking. Denies redness.   No Known Allergies   Current Outpatient Prescriptions on File Prior to Visit  Medication Sig Dispense Refill  . diphenhydrAMINE (BENADRYL) 25 MG tablet Take 25 mg by mouth as needed.    . rivaroxaban (XARELTO) 20 MG TABS tablet TAKE 1 TABLET BY MOUTH DAILY WITH DINNER 30 tablet 0   No current facility-administered medications on file prior to visit.    Review of Systems  Constitutional: Negative for fever and chills.  Respiratory: Negative for chest tightness and shortness of breath.   Cardiovascular: Negative for chest pain, palpitations and leg swelling.  Neurological: Negative for headaches.      Objective:    BP 110/80 mmHg  Pulse 66  Temp(Src) 98.2 F (36.8 C) (Oral)  Resp 16  Ht  (1.626 m)  Wt 172 lb (78.019 kg)  BMI 29.51 kg/m2  SpO2 98% Nursing note and vital signs reviewed.  Physical Exam  Constitutional: He is oriented to person, place, and time. He appears  well-developed and well-nourished. No distress.  Cardiovascular: Normal rate, regular rhythm, normal heart sounds and intact distal pulses.   Pulmonary/Chest: Effort normal and breath sounds normal.  Musculoskeletal:  Right leg - No obvious deformity, discoloration or edema. Pulses are present and 1+ for dorsalis pedis and posterior tibial. Mild tenderness of medial gastrocnemius. Strength is normal. No masses or deformity.   Neurological: He is alert and oriented to person, place, and time.  Skin: Skin is warm and dry.  Psychiatric: He has a normal mood and affect. His behavior is normal. Judgment and thought content normal.       Assessment & Plan:   Problem List Items Addressed This Visit      Cardiovascular and Mediastinum   Right leg DVT (HCC) - Primary    Stable and anticoagulated with Xarelto with no adverse side effects. Obtain hepatic function and CBC for therapeutic drug monitoring. Obtain LE doppler to check current DVT status. Continue current dosage of Xarelto. Follow up pending doppler and blood work if needed.       Relevant Orders   CBC   Hepatic function panel   VAS [place] LOWER EXTREMITY VENOUS (DVT)    Other Visit Diagnoses    Therapeutic drug monitoring        Relevant Orders    CBC    Hepatic function panel

## 2015-04-02 NOTE — Progress Notes (Signed)
Pre visit review using our clinic review tool, if applicable. No additional management support is needed unless otherwise documented below in the visit note. 

## 2015-04-02 NOTE — Patient Instructions (Addendum)
Thank you for choosing  HealthCare.  Summary/Instructions:  Please continue to take your medications as prescribed.   Please stop by the lab on the basement level of the building for your blood work. Your results will be released to MyChart (or called to you) after review, usually within 72 hours after test completion. If any changes need to be made, you will be notified at that same time.  If your symptoms worsen or fail to improve, please contact our office for further instruction, or in case of emergency go directly to the emergency room at the closest medical facility.     

## 2015-04-02 NOTE — Assessment & Plan Note (Signed)
Stable and anticoagulated with Xarelto with no adverse side effects. Obtain hepatic function and CBC for therapeutic drug monitoring. Obtain LE doppler to check current DVT status. Continue current dosage of Xarelto. Follow up pending doppler and blood work if needed.

## 2015-05-01 ENCOUNTER — Other Ambulatory Visit: Payer: Self-pay | Admitting: Family

## 2015-06-02 ENCOUNTER — Other Ambulatory Visit: Payer: Self-pay | Admitting: Family

## 2015-06-20 IMAGING — CT CT CTA ABD/PEL W/CM AND/OR W/O CM
2 of 5 series · 15 of 46 positions shown, 19 images · IV contrast (OMNIPAQUE 300)
Comparison: CT ANGIO CHEST W/CM &/OR WO/CM dated 12/21/2010

CLINICAL DATA: Right lower extremity DVT. History of bilateral
pulmonary embolism.

EXAM:
CT ANGIOGRAPHY ABDOMEN AND PELVIS WITH CONTRAST AND WITHOUT CONTRAST
TECHNIQUE: Multidetector CT imaging of the abdomen and pelvis was performed
using the standard protocol during bolus administration of
intravenous contrast. Multiplanar reconstructed images and MIPs were
obtained and reviewed to evaluate the vascular anatomy.
CONTRAST:  100mL OMNIPAQUE IOHEXOL 300 MG/ML  SOLN

[Series 2: abd/pel with · axial · 0.73mm/px · z∈[-455,-10]mm · 12 of 103 slices shown, 16 images]
[im 9/103  soft-tissue]
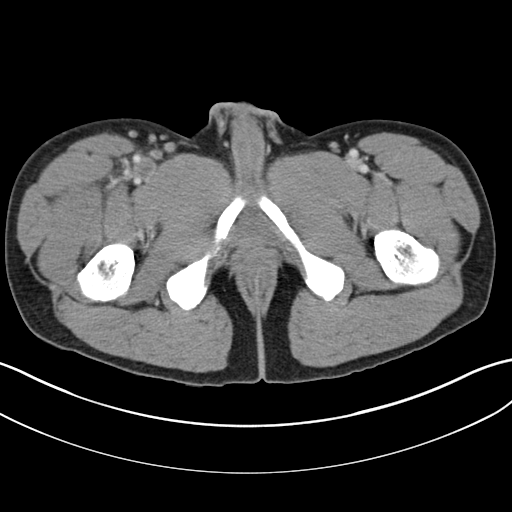
[im 9/103  bone]
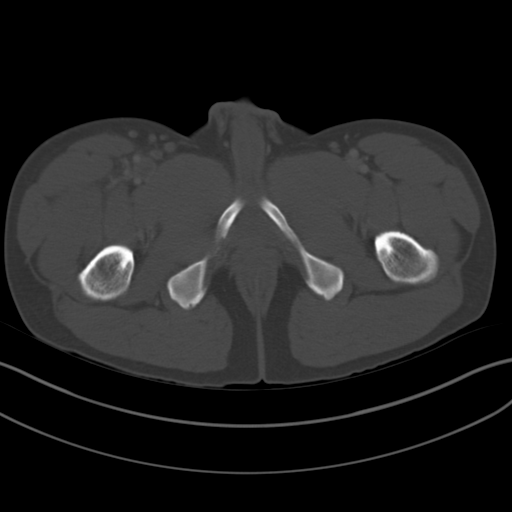
[im 17/103  soft-tissue]
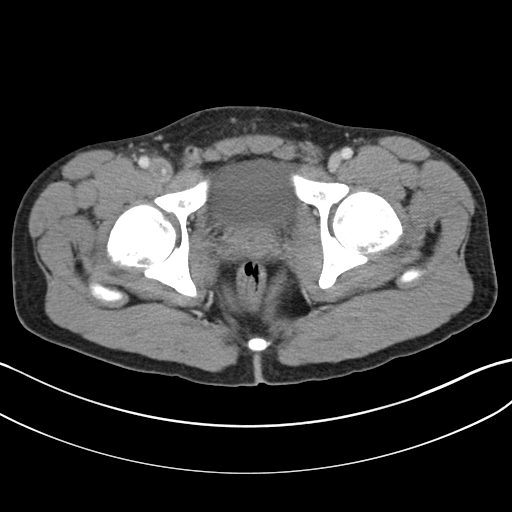
[im 29/103  soft-tissue]
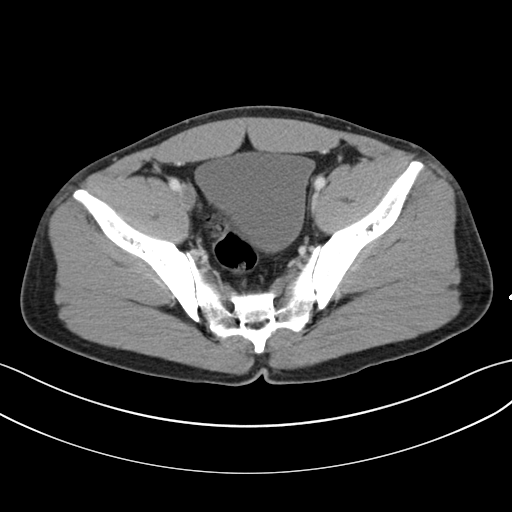
[im 37/103  soft-tissue]
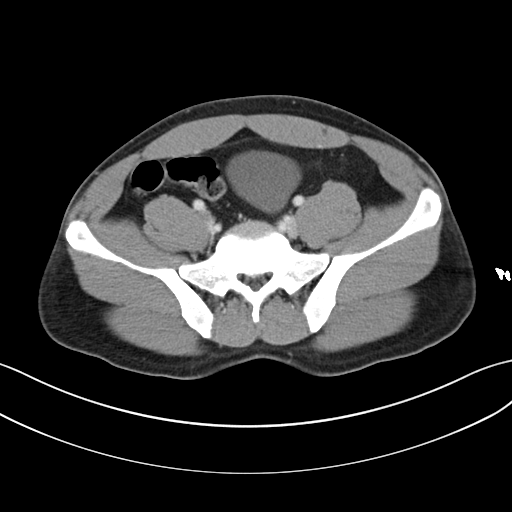
[im 45/103  soft-tissue]
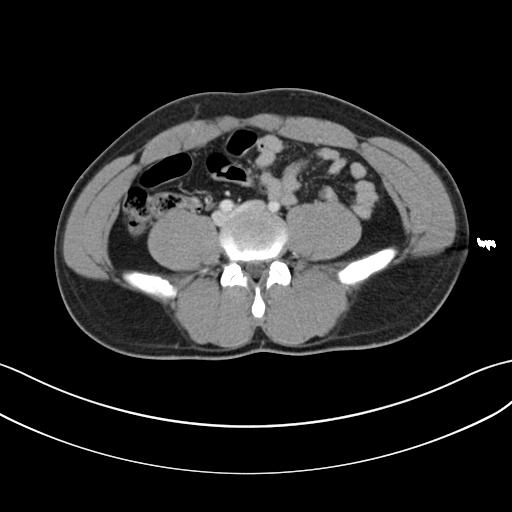
[im 58/103  soft-tissue]
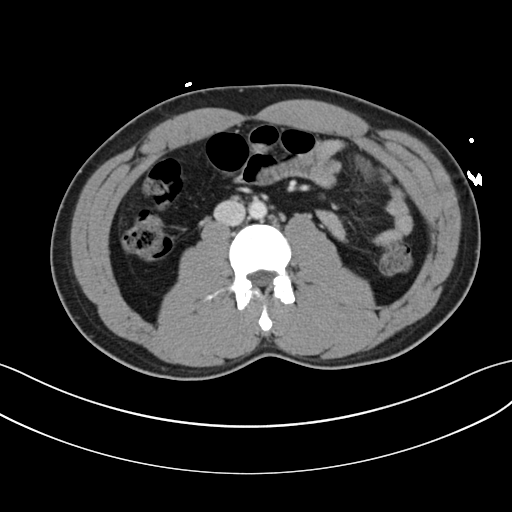
[im 66/103  soft-tissue]
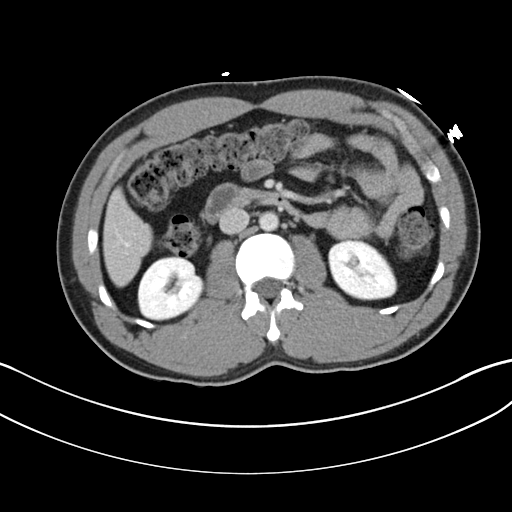
[im 78/103  soft-tissue]
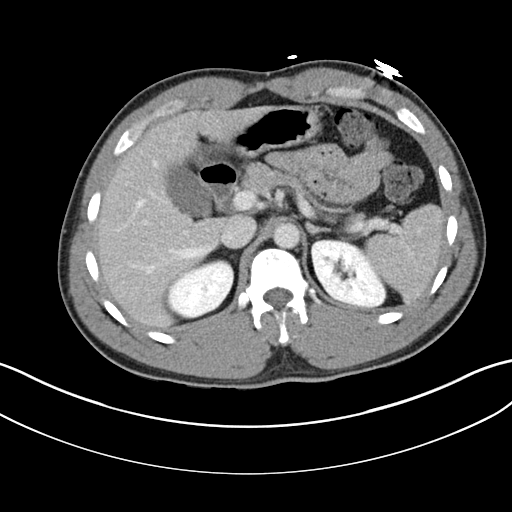
[im 86/103  soft-tissue]
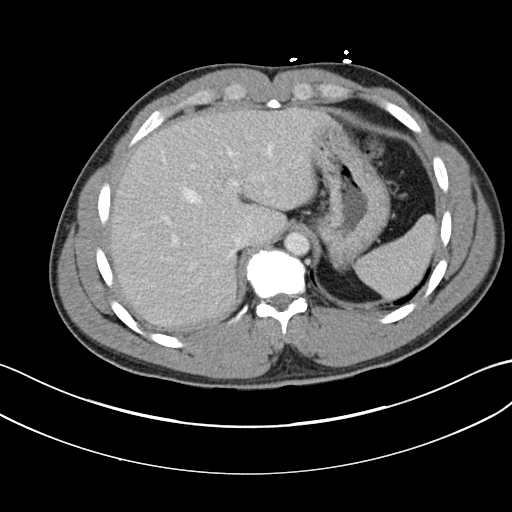
[im 86/103  lung]
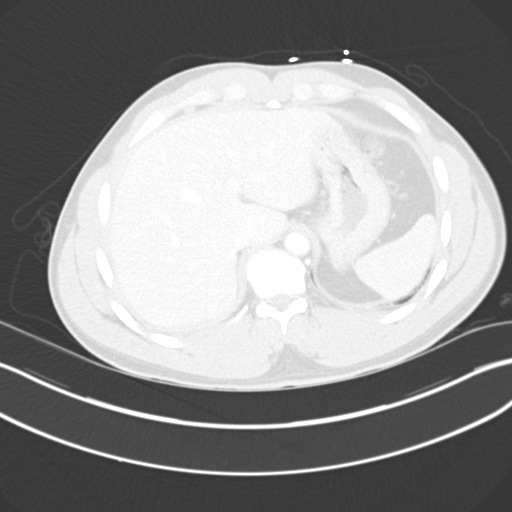
[im 86/103  bone]
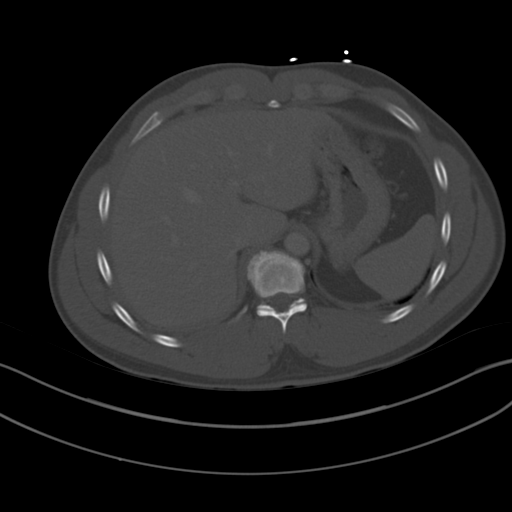
[im 90/103  lung]
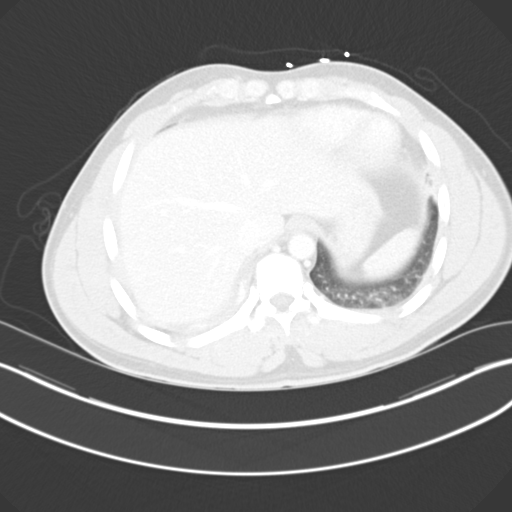
[im 94/103  soft-tissue]
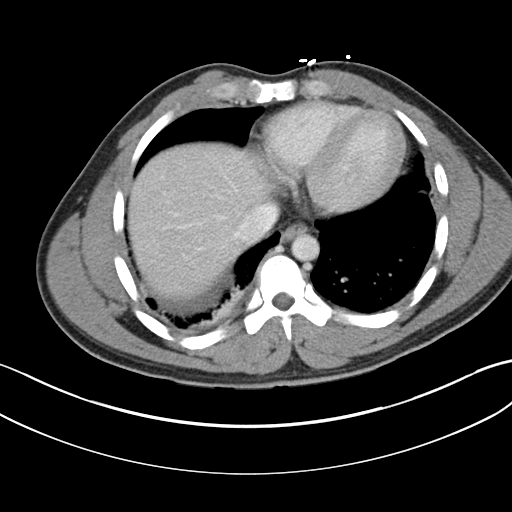
[im 94/103  lung]
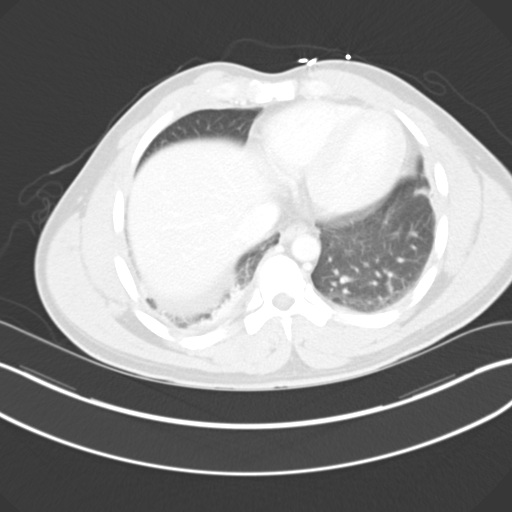
[im 98/103  lung]
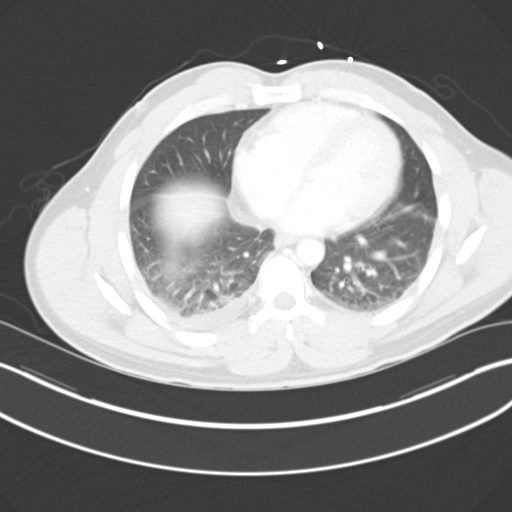

[Series 602: <mpr thick range> · coronal · 1.00mm/px · 3 of 82 slices shown]
[im 21/82  soft-tissue]
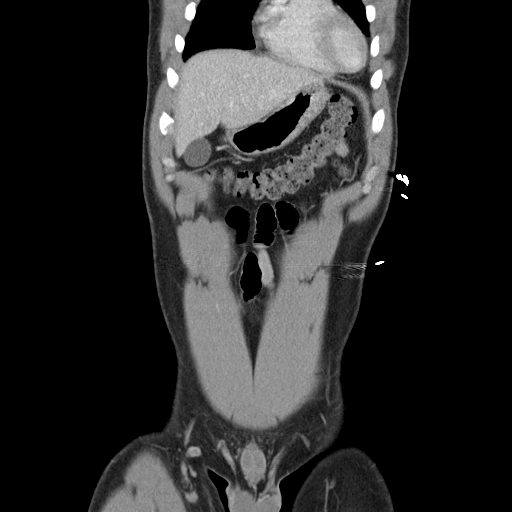
[im 41/82  soft-tissue]
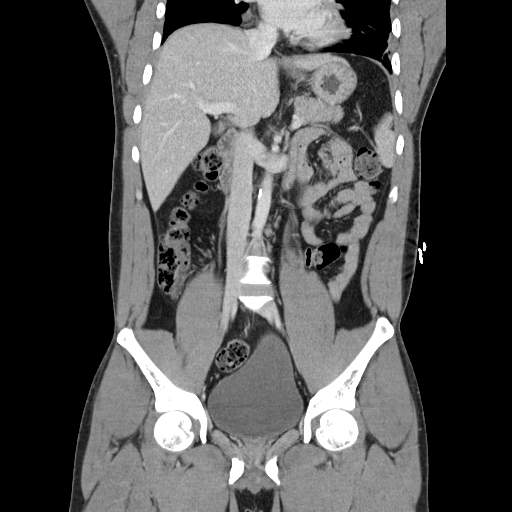
[im 61/82  soft-tissue]
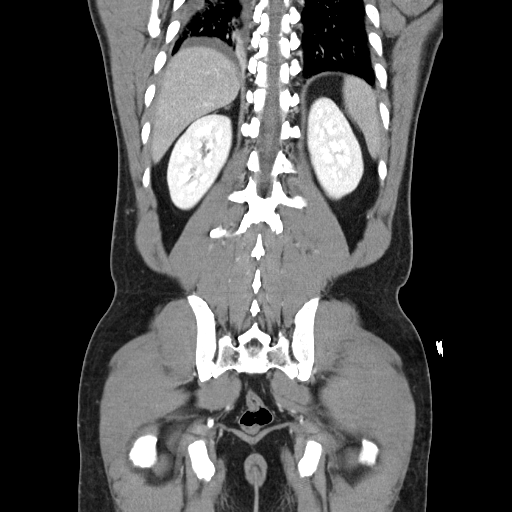

[15 of 46 positions shown; findings below may reference images not displayed]

FINDINGS: Beginning in the superior segment of the external iliac vein,
thrombus is visualized in the right external iliac vein and right
common femoral vein extending into the thigh. The common femoral
vein is very distended with clot. Thrombus nearly extends into the
right common iliac vein. No thrombus is identified in the IVC.

No mass-effect is identified on venous structures. There is no
evidence of adenopathy. No left-sided femoral or iliac DVT is
identified. The abdominal aorta is normally patent.

Visualized lung bases show a small right pleural effusion. The
liver, gallbladder, pancreas, spleen, adrenal glands and kidneys are
unremarkable. There is a simple cyst of the anterior left kidney
measuring approximately 2 cm. No abnormal fluid collections are
identified. The bladder is unremarkable. No bony abnormalities are
seen.

Review of the MIP images confirms the above findings.
IMPRESSION: Right-sided iliofemoral DVT seen throughout the visualized common
femoral and external iliac veins. There is no evidence of IVC
thrombus or extrinsic mass effect on venous structures.

## 2015-06-21 IMAGING — XA IR INFUSION THROMBOL VENOUS INITIAL (MS)
1 series · 12 of 16 positions shown · non-contrast
Comparison: none

CLINICAL DATA: Right lower extremity DVT

[Series 1: run · 12 of 16 slices shown]
[im 1/16]
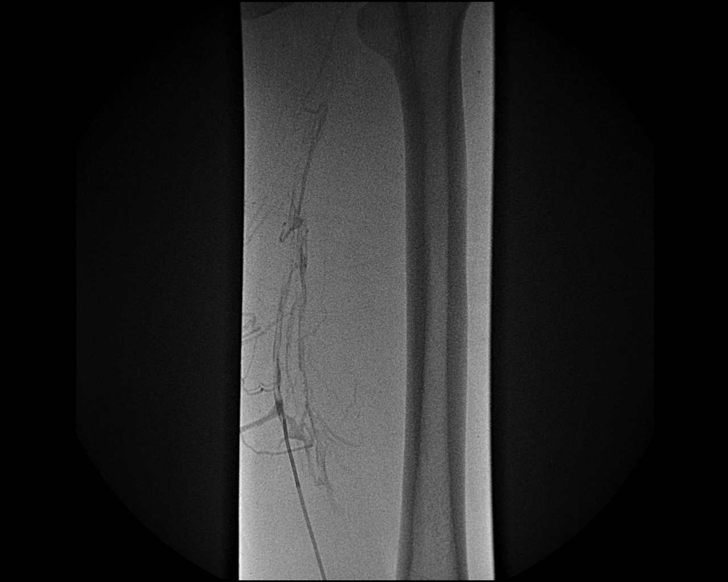
[im 3/16]
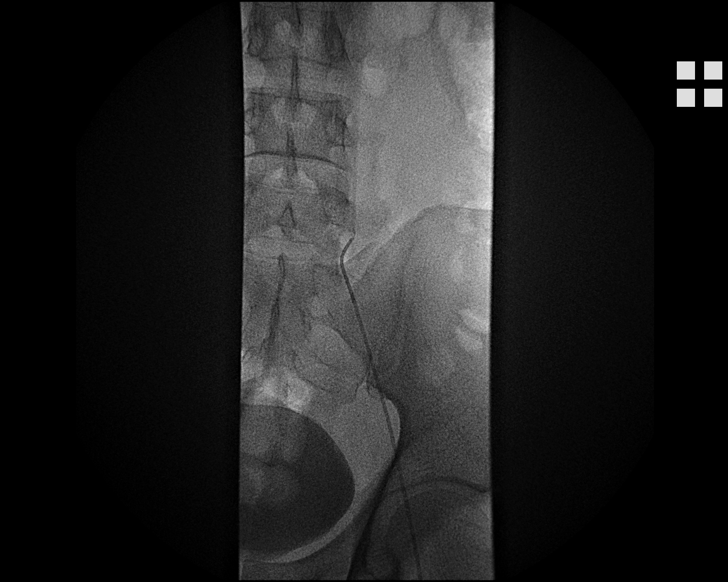
[im 4/16]
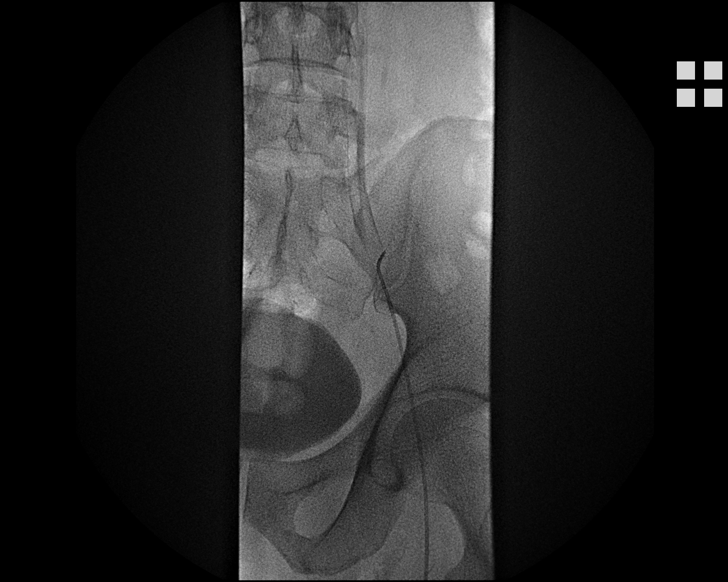
[im 5/16]
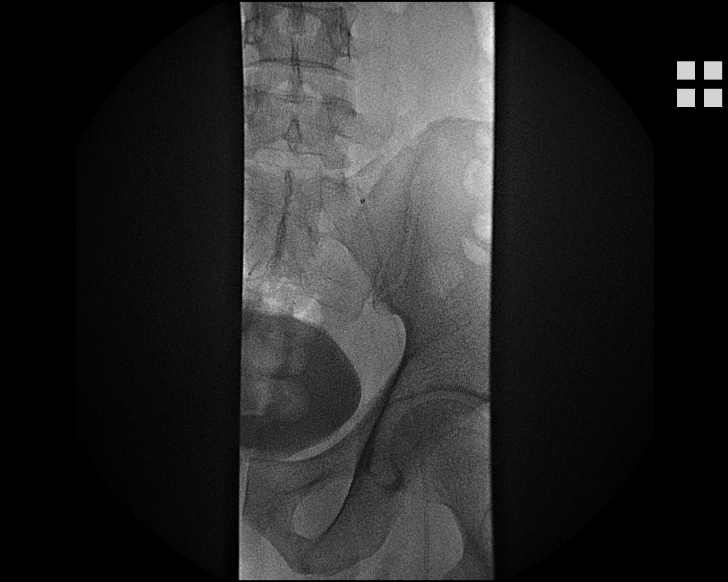
[im 7/16]
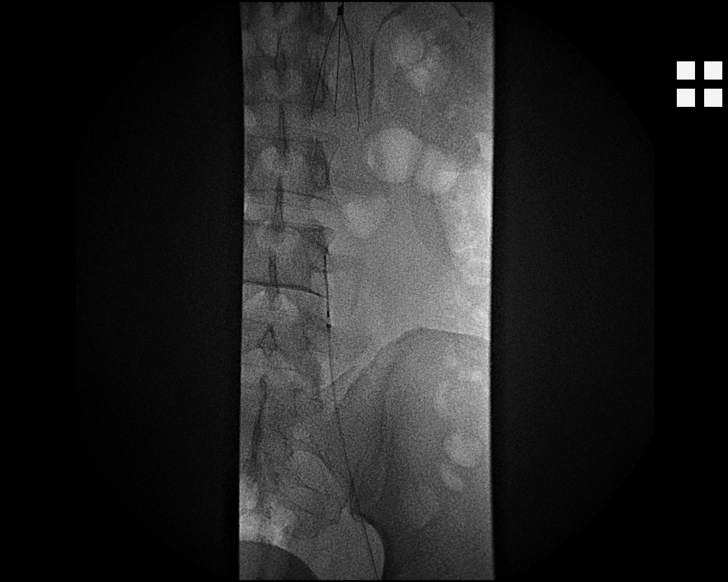
[im 8/16]
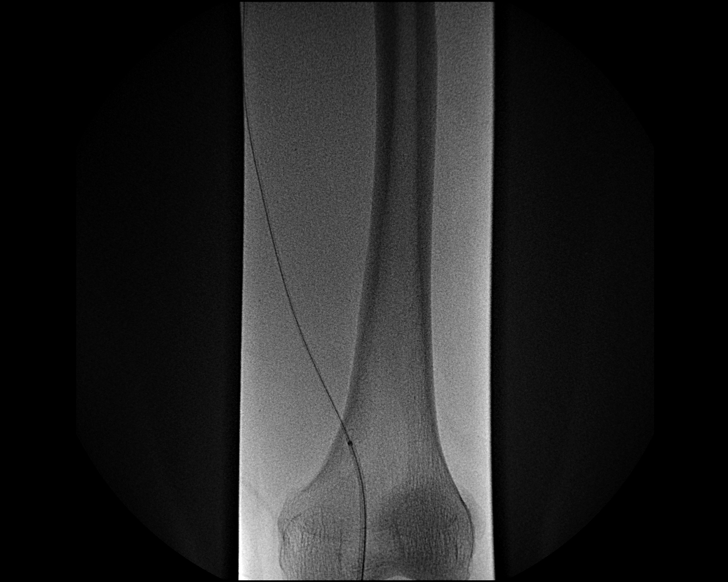
[im 9/16]
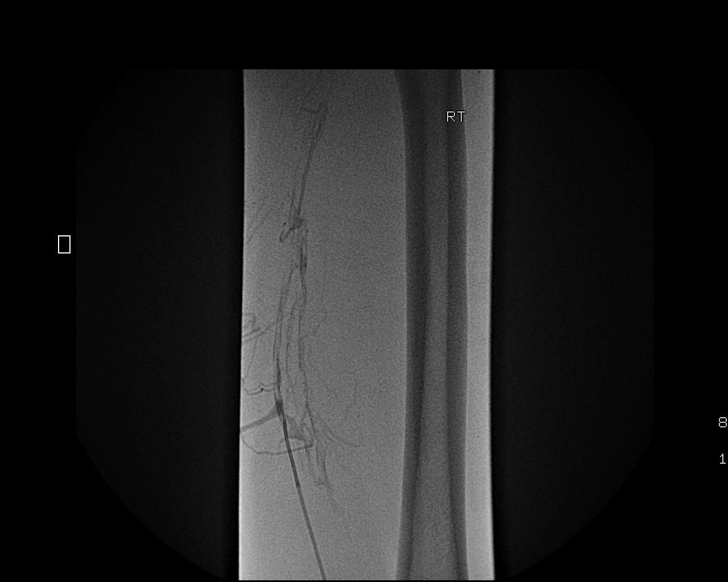
[im 11/16]
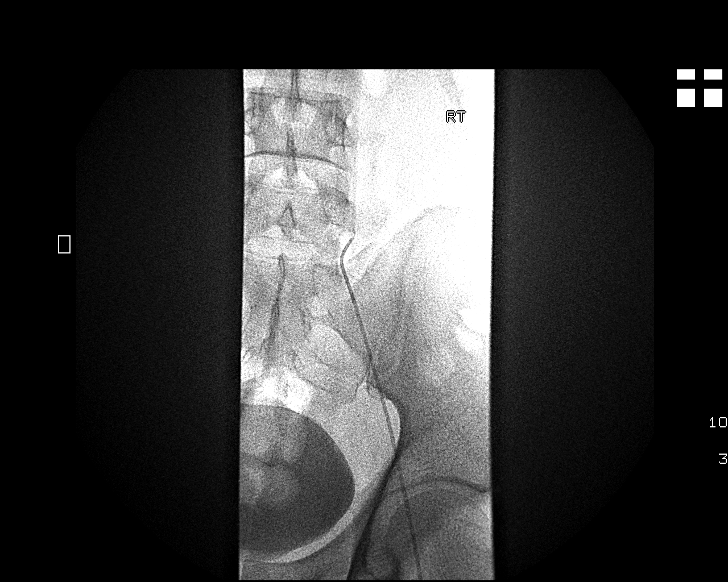
[im 12/16]
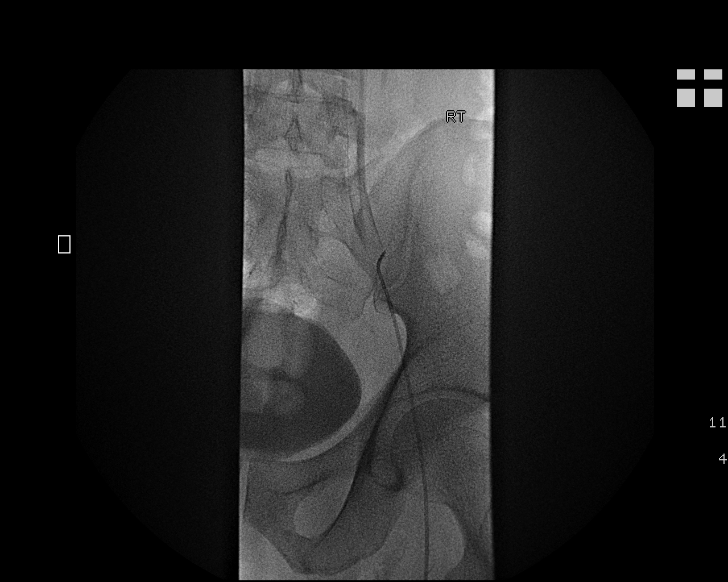
[im 13/16]
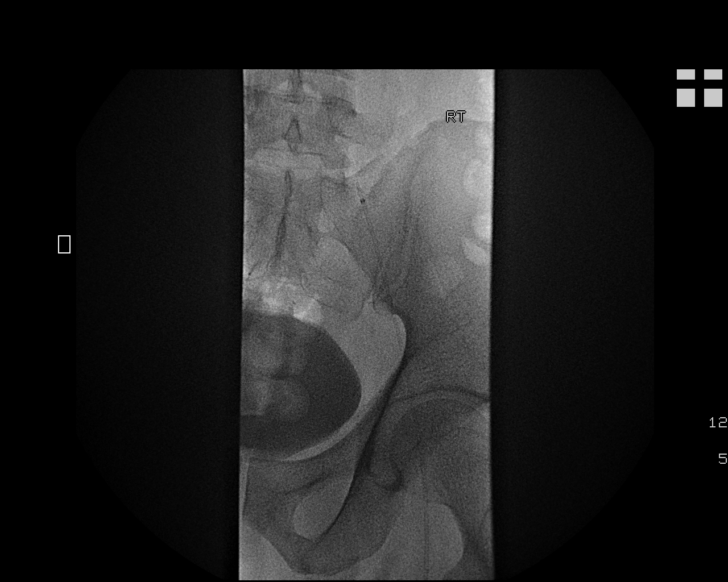
[im 15/16]
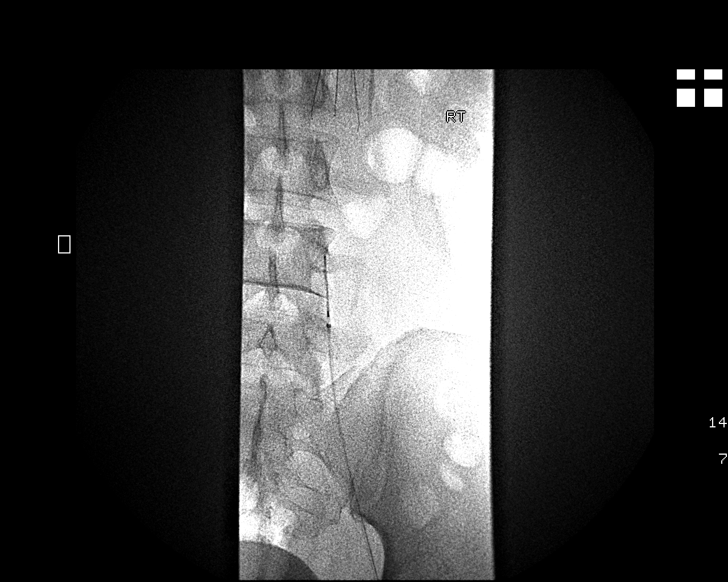
[im 16/16]
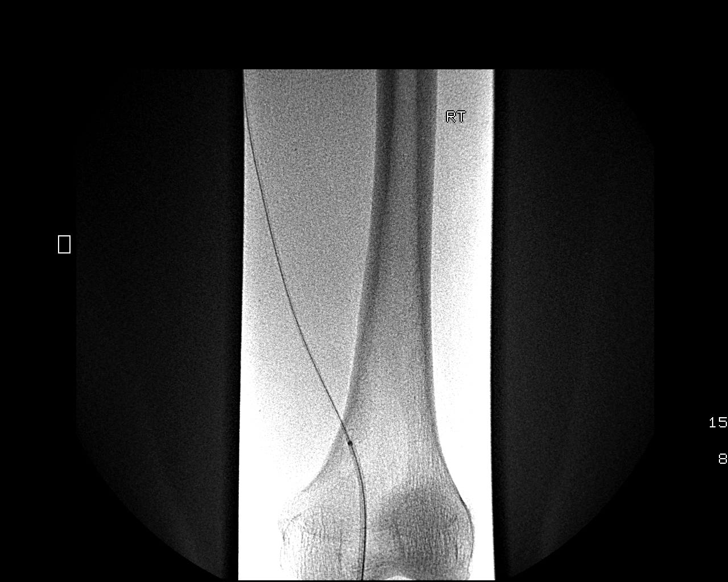

[12 of 16 positions shown; findings below may reference images not displayed]

EXAM:
IR INFUSION THROMBOL VENOUS INITIAL (MS); IR ULTRASOUND GUIDANCE
VASC ACCESS RIGHT; RIGHT EXTREMITY VENOGRAPHY; INFERIOR VENA
CAVOGRAM

FLUOROSCOPY TIME:  6 min and 6 seconds.

MEDICATIONS AND MEDICAL HISTORY:
Versed 1.5 mg, Fentanyl 75 mcg.

Additional Medications: None.

ANESTHESIA/SEDATION:
Moderate sedation time: 25 minutes

CONTRAST:  30 cc Omnipaque 300

PROCEDURE:
The procedure, risks, benefits, and alternatives were explained to
the patient. Questions regarding the procedure were encouraged and
answered. The patient understands and consents to the procedure.

The right popliteal fossa in the prone position was prepped with
Betadine in a sterile fashion, and a sterile drape was applied
covering the operative field. A sterile gown and sterile gloves were
used for the procedure.

Under sonographic guidance, a micropuncture needle was inserted into
the thrombosis popliteal vein and removed over a 018 wire. This was
up sized to a glidewire. A 7 French sheath was inserted over the
glidewire into the popliteal vein. Right lower extremity venography
was performed.

A Kumpe the catheter was advanced over the glidewire to the femoral
vein. The catheter was slowly advanced over the wire with
intermittent injections for venography, to the level of the IVC. IVC
venography was performed.

The thrombus was noted to be somewhat hard preventing easy passage
of the Kumpe the catheter. Angiojet and mechanical thrombectomy was
therefore not performed today.

The Kumpe be was exchanged over a stiff glidewire for a 90 cm, 50 cm
infusion length 5 French multi side hole catheter.
FINDINGS: Venography of the right lower extremity demonstrates massive DVT
extending from the popliteal vein into the common iliac vein.

The final image demonstrates an infusion catheter extending from the
right popliteal vein to the IVC. IVC filter is in place.

COMPLICATIONS:
None
IMPRESSION: Successful establishment of access for right lower extremity venous
lysis. SOMO at 0.5 milligrams/hour was instituted.

## 2015-06-21 IMAGING — US IR INFUSION THROMBOL VENOUS INITIAL (MS)
1 series · 2 of 2 positions shown · non-contrast
Comparison: none

CLINICAL DATA: Right lower extremity DVT

[Series 1: ir infusion thrombol venous initial (ms) · 2 of 2 slices shown]
[im 1/2]
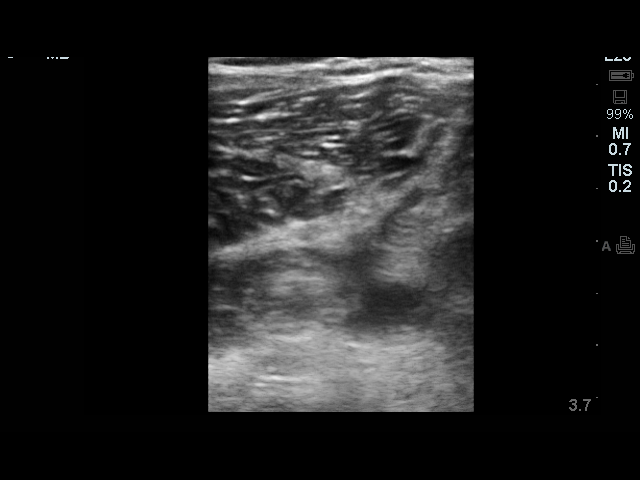
[im 2/2]
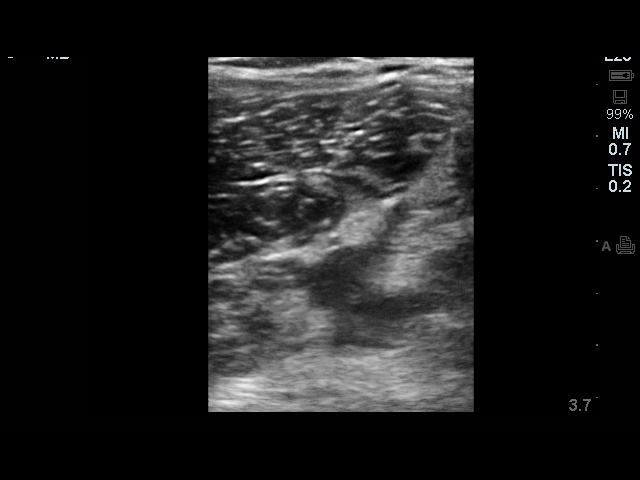

[2 of 2 positions shown; findings below may reference images not displayed]

EXAM:
IR INFUSION THROMBOL VENOUS INITIAL (MS); IR ULTRASOUND GUIDANCE
VASC ACCESS RIGHT; RIGHT EXTREMITY VENOGRAPHY; INFERIOR VENA
CAVOGRAM

FLUOROSCOPY TIME:  6 min and 6 seconds.

MEDICATIONS AND MEDICAL HISTORY:
Versed 1.5 mg, Fentanyl 75 mcg.

Additional Medications: None.

ANESTHESIA/SEDATION:
Moderate sedation time: 25 minutes

CONTRAST:  30 cc Omnipaque 300

PROCEDURE:
The procedure, risks, benefits, and alternatives were explained to
the patient. Questions regarding the procedure were encouraged and
answered. The patient understands and consents to the procedure.

The right popliteal fossa in the prone position was prepped with
Betadine in a sterile fashion, and a sterile drape was applied
covering the operative field. A sterile gown and sterile gloves were
used for the procedure.

Under sonographic guidance, a micropuncture needle was inserted into
the thrombosis popliteal vein and removed over a 018 wire. This was
up sized to a glidewire. A 7 French sheath was inserted over the
glidewire into the popliteal vein. Right lower extremity venography
was performed.

A Kumpe the catheter was advanced over the glidewire to the femoral
vein. The catheter was slowly advanced over the wire with
intermittent injections for venography, to the level of the IVC. IVC
venography was performed.

The thrombus was noted to be somewhat hard preventing easy passage
of the Kumpe the catheter. Angiojet and mechanical thrombectomy was
therefore not performed today.

The Kumpe be was exchanged over a stiff glidewire for a 90 cm, 50 cm
infusion length 5 French multi side hole catheter.
FINDINGS: Venography of the right lower extremity demonstrates massive DVT
extending from the popliteal vein into the common iliac vein.

The final image demonstrates an infusion catheter extending from the
right popliteal vein to the IVC. IVC filter is in place.

COMPLICATIONS:
None
IMPRESSION: Successful establishment of access for right lower extremity venous
lysis. SOMO at 0.5 milligrams/hour was instituted.

## 2015-06-21 IMAGING — XA IR IVC FILTER PLMT / S&I /IMG GUID/MOD SED
1 series · 12 of 15 positions shown · non-contrast
Comparison: none

CLINICAL DATA: Right lower extremity DVT

[Series 1: run · 12 of 15 slices shown]
[im 1/15]
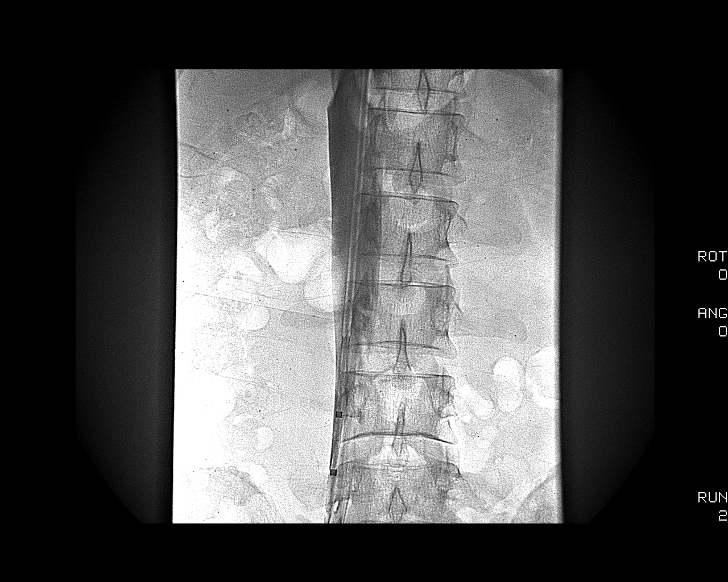
[im 2/15]
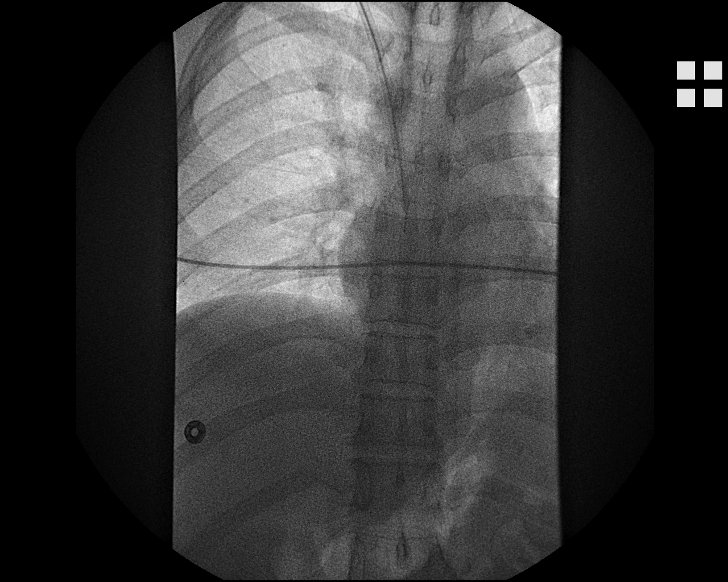
[im 4/15]
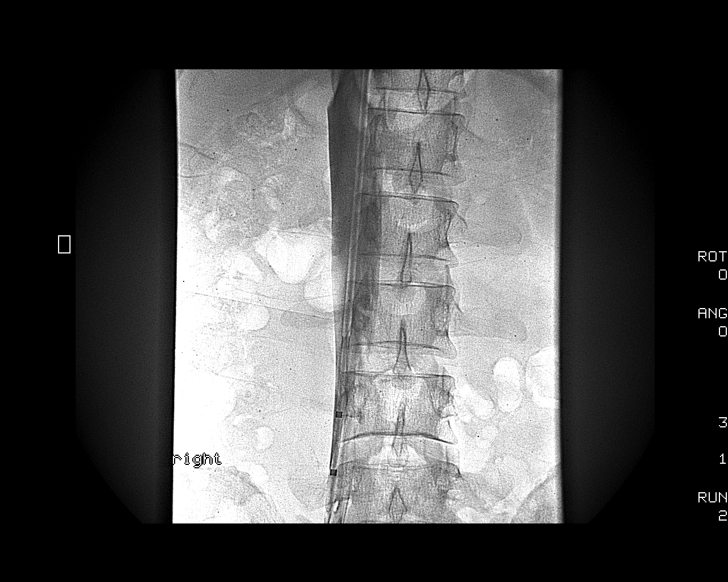
[im 5/15]
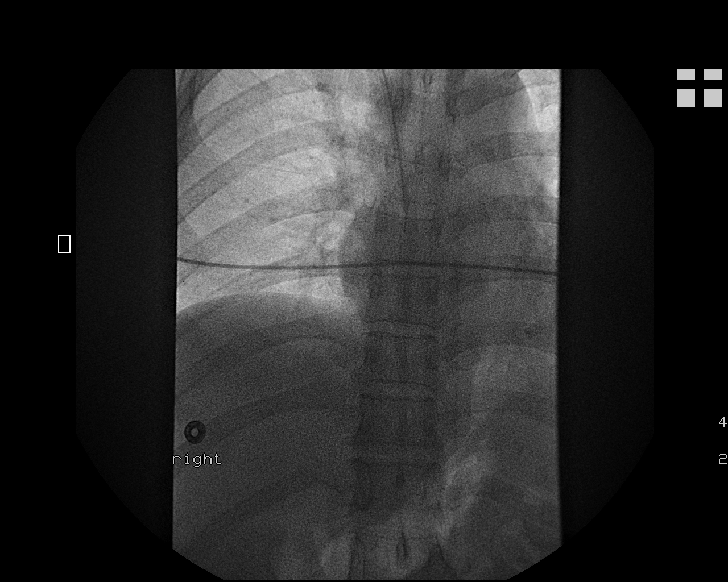
[im 6/15]
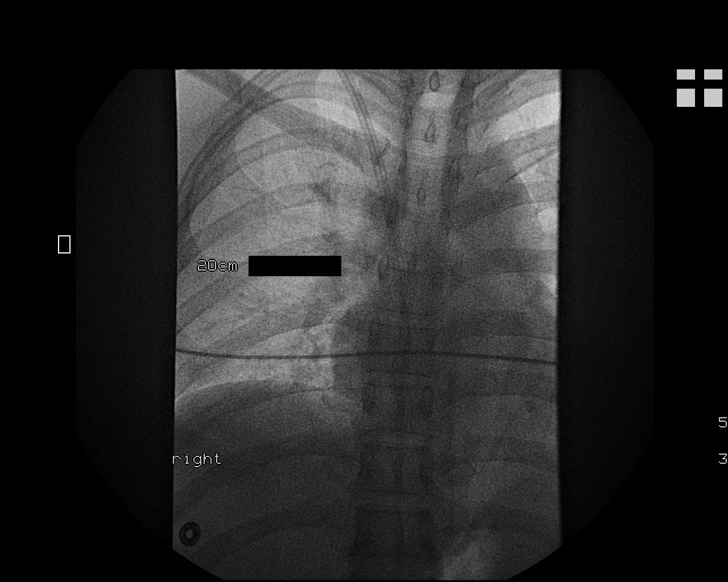
[im 7/15]
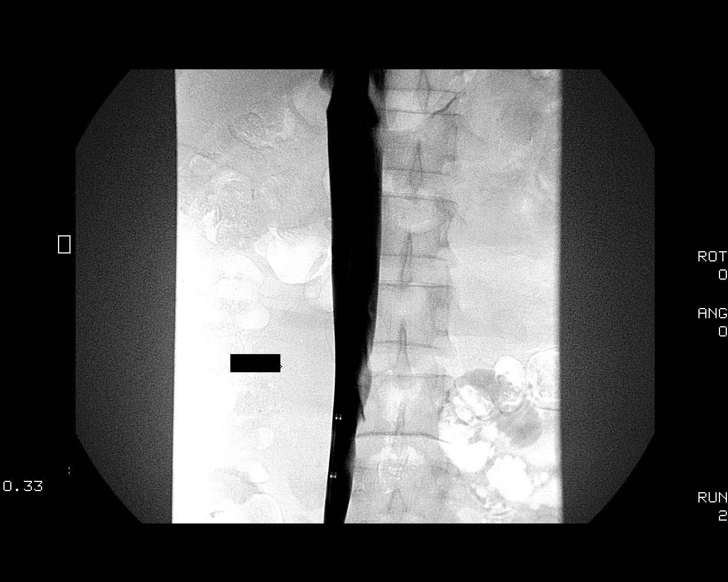
[im 9/15]
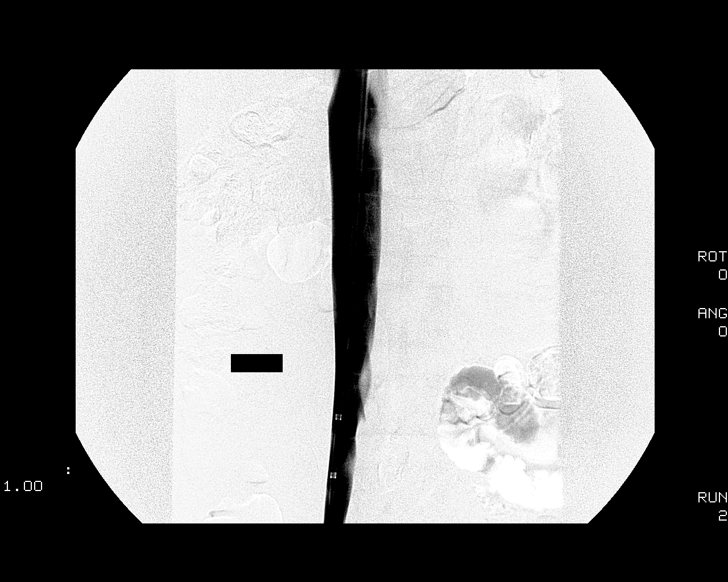
[im 10/15]
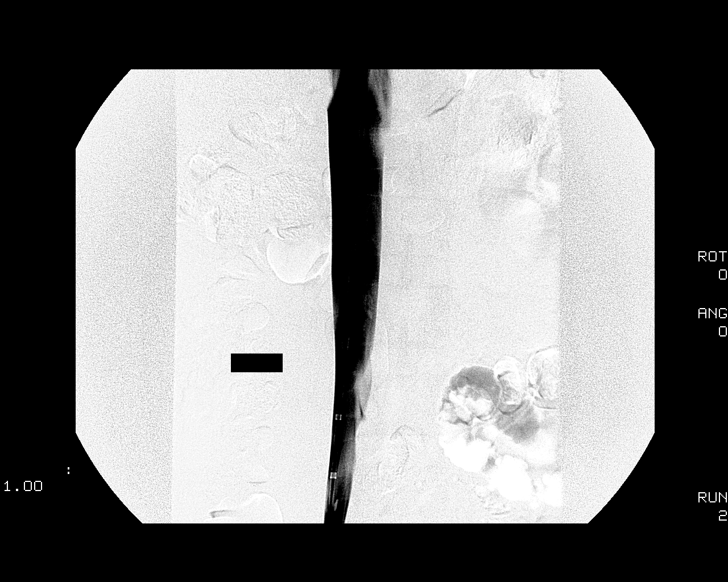
[im 11/15]
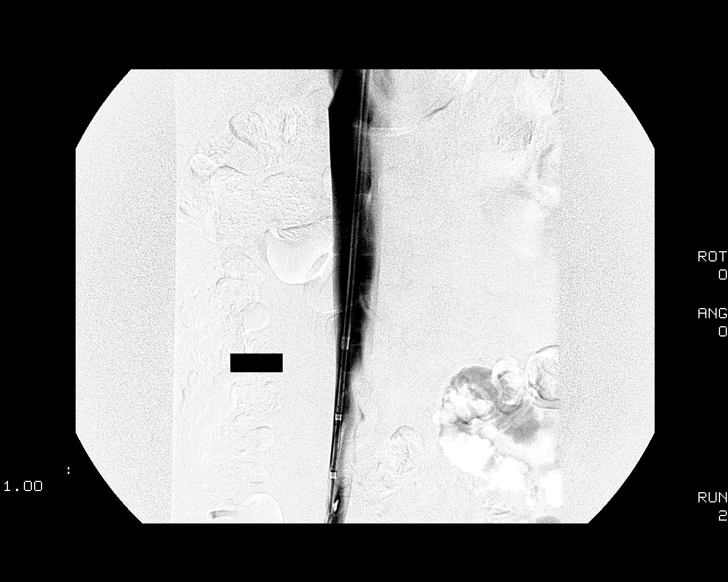
[im 12/15]
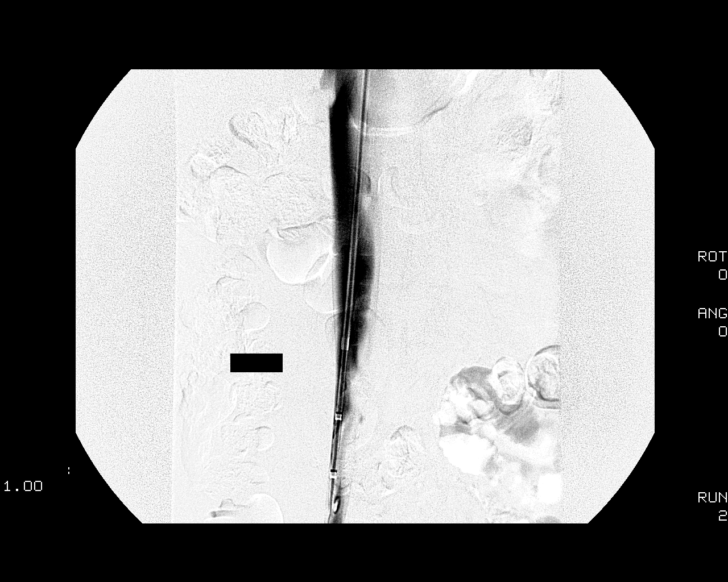
[im 14/15]
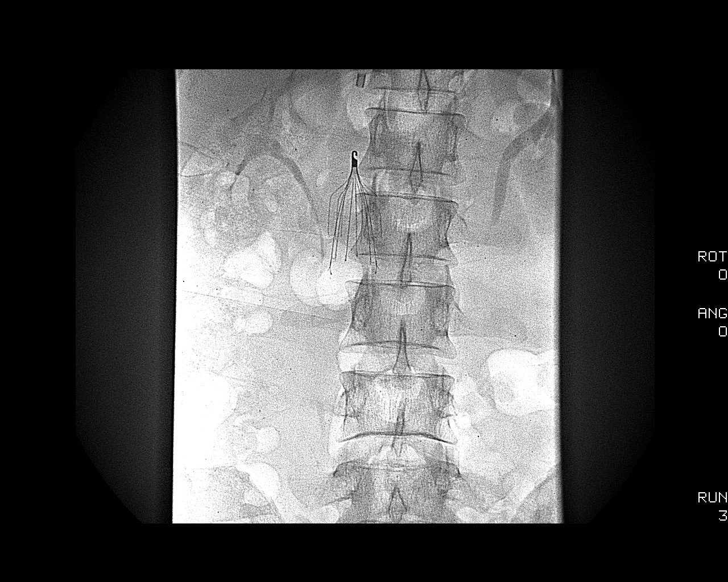
[im 15/15]
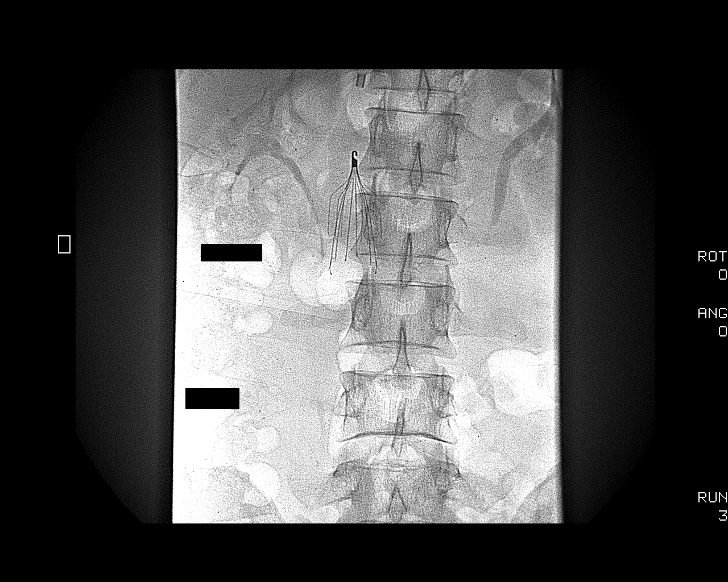

[12 of 15 positions shown; findings below may reference images not displayed]

EXAM:
IVC FILTER,INFERIOR VENA CAVOGRAM, FLUOROSCOPIC GUIDANCE FOR CENTRAL
VENOUS CATHETER PLACED

FLUOROSCOPY TIME:  1 min and 54 seconds.

MEDICATIONS AND MEDICAL HISTORY:
Versed 1.5 mg, Fentanyl 75 mcg.

Additional Medications: None.

ANESTHESIA/SEDATION:
Moderate sedation time: 20 minutes

CONTRAST:  40 cc Omnipaque 300

PROCEDURE:
The procedure, risks, benefits, and alternatives were explained to
the patient. Questions regarding the procedure were encouraged and
answered. The patient understands and consents to the procedure.

The right neck was prepped with Betadine in a sterile fashion, and a
sterile drape was applied covering the operative field. A sterile
gown and sterile gloves were used for the procedure.

The right internal jugular vein was noted to be patent initially
with ultrasound. Under sonographic guidance, a micropuncture needle
was inserted into the right internal jugular vein (Ultrasound image
documentation was performed). It was removed over an 018 wire which
was upsized to Don Lolito Onacram. The sheath was inserted over the wire and
into the IVC. IVC venography was performed.

The temporary filter was then deployed in the infrarenal IVC.

The sheath was exchanged over a Bentson wire for a temporary
dialysis catheter.
FINDINGS: IVC venography demonstrates renal vein inflow at L1 and no venous
anomaly. No IVC thrombus.

The image demonstrates placement of an IVC filter with its tip at
the L1 inferior end-plate.

Final image demonstrates a right internal jugular temporary dialysis
catheter with its tip at the cavoatrial junction.

COMPLICATIONS:
None
IMPRESSION: Successful infrarenal IVC filter placement. This is a temporary
filter. It can be removed or remain in place to become permanent.

A right internal jugular temporary dialysis catheter was exchanged
for venous access after IVC filter placement. This will prevent a
bleeding complication at the internal jugular access site in
preparation for venous lysis.

## 2015-06-22 IMAGING — XA IR THROMB F/U EVAL ART/VEN SUBSEQ DAY (MS)
1 series · 12 of 22 positions shown · non-contrast
Comparison: none

CLINICAL DATA: Right lower extremity DVT and status post placement
of IVC filter with initiation of transcatheter thrombolytics therapy
for ileofemoral DVT yesterday. Fibrinogen has fallen during thrombus
therapy to 100 this morning. There has also been persistent oozing
around the right jugular central venous catheter.

[Series 1: run · 12 of 22 slices shown]
[im 1/22]
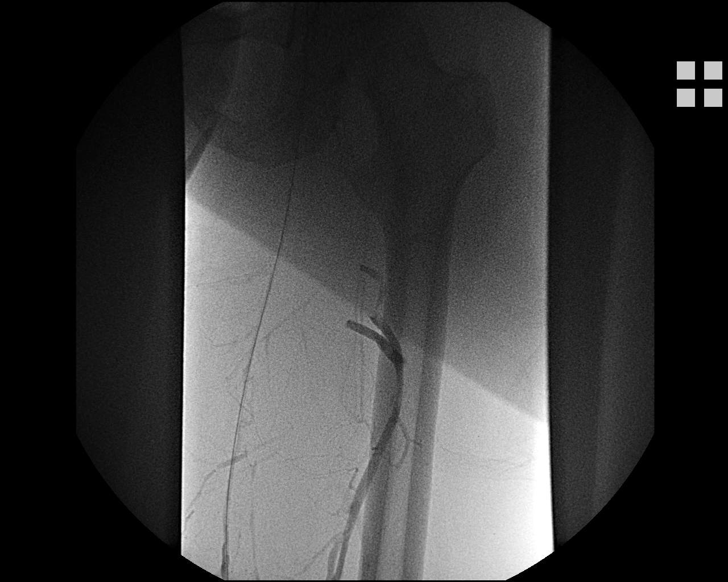
[im 3/22]
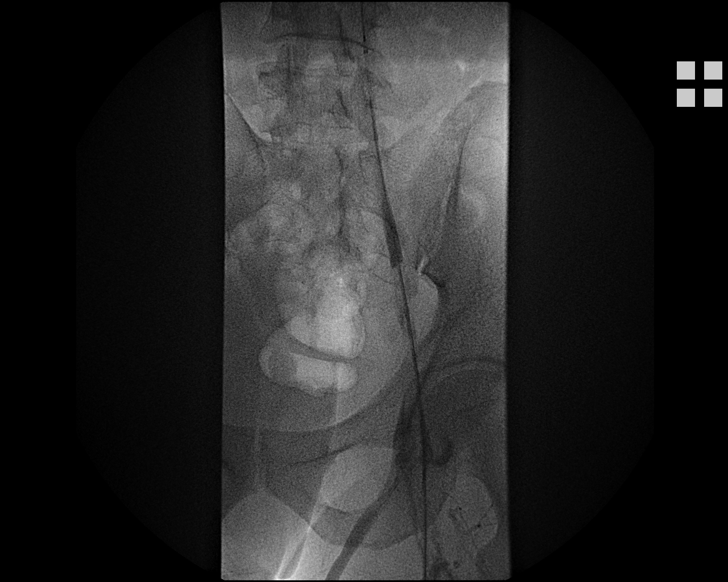
[im 5/22]
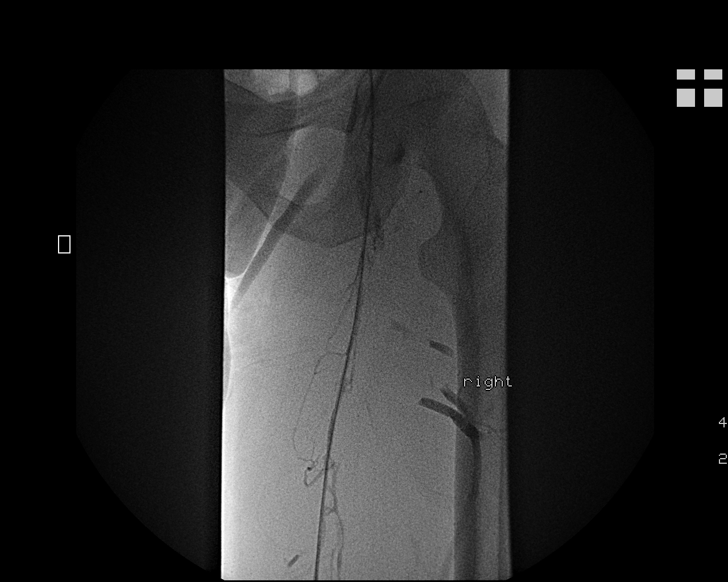
[im 7/22]
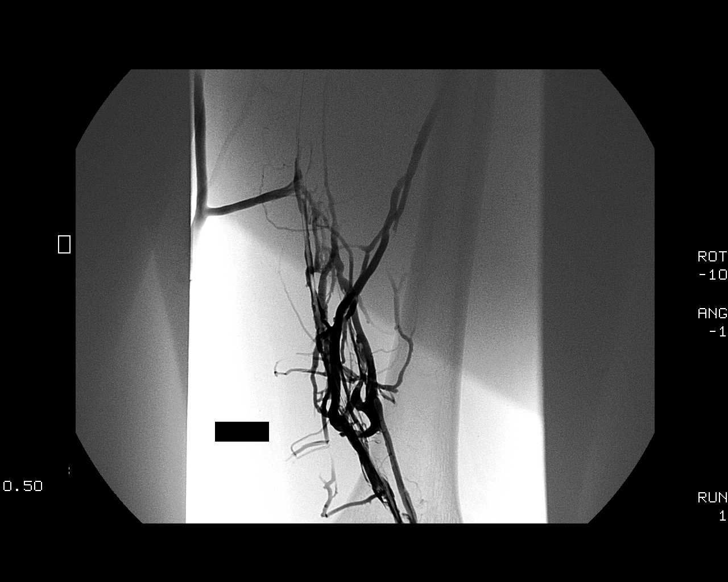
[im 9/22]
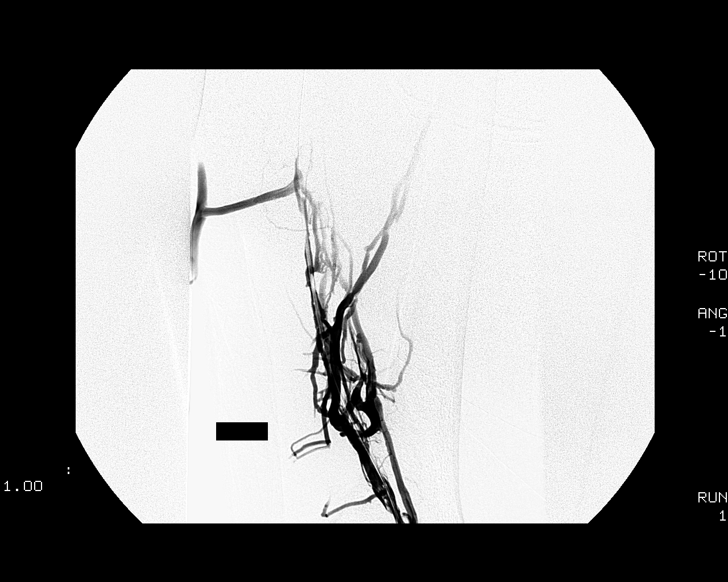
[im 11/22]
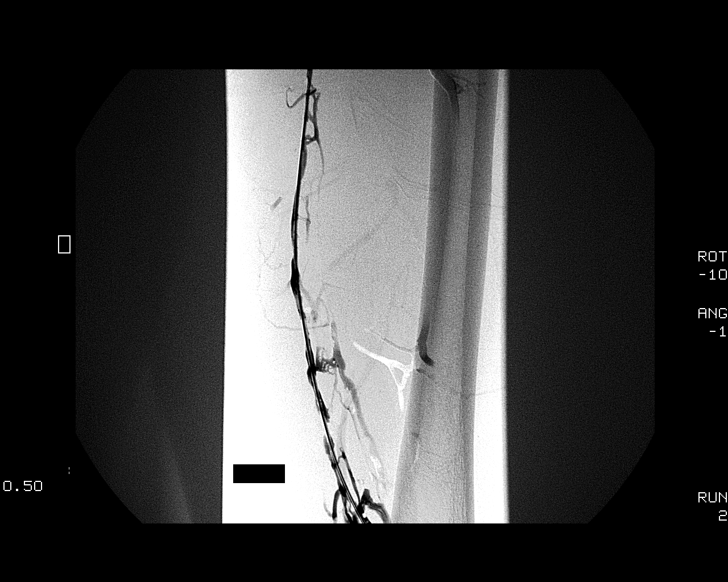
[im 12/22]
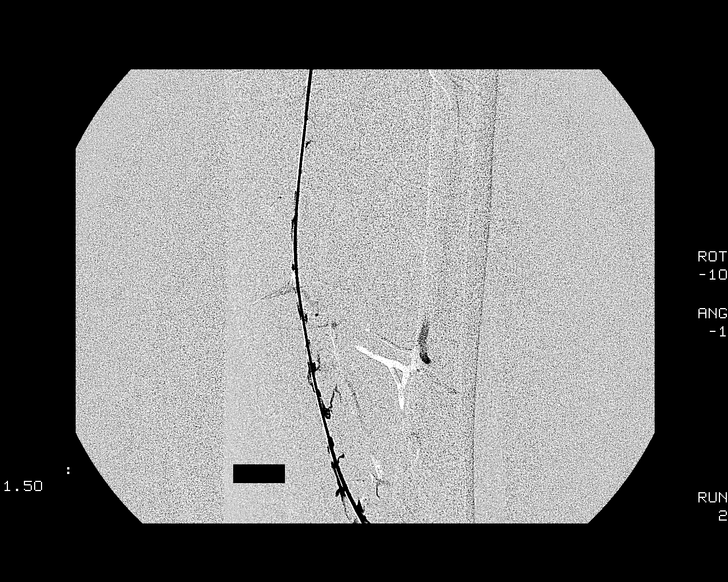
[im 14/22]
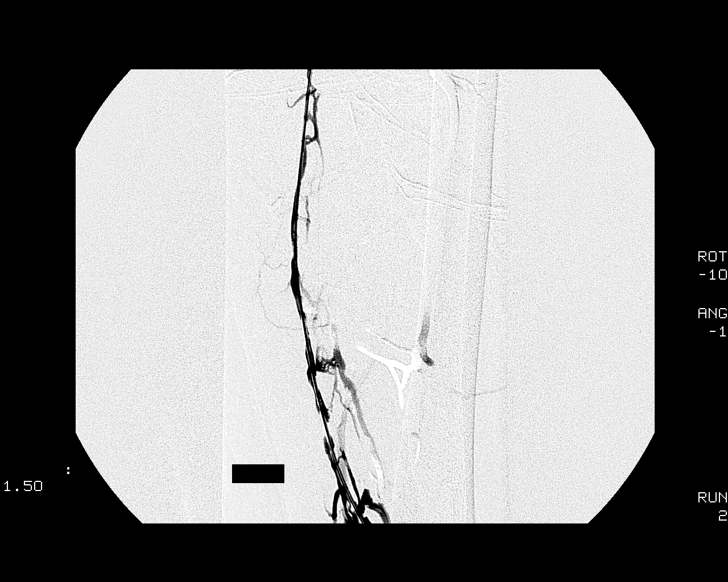
[im 16/22]
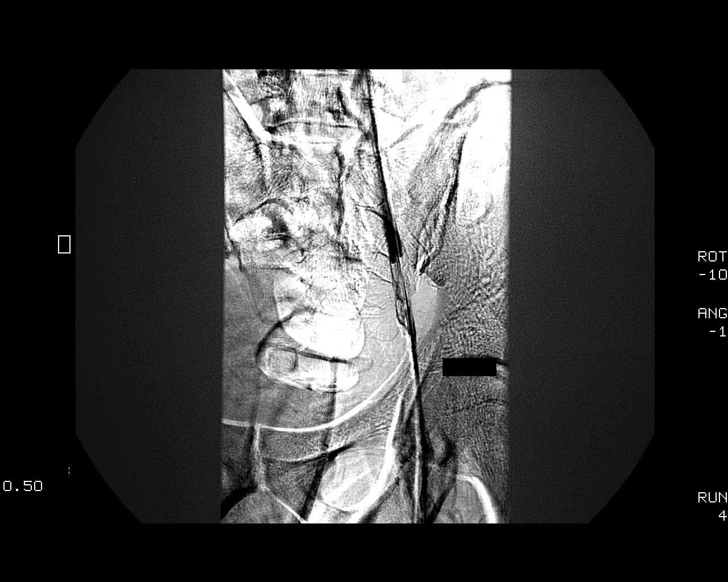
[im 18/22]
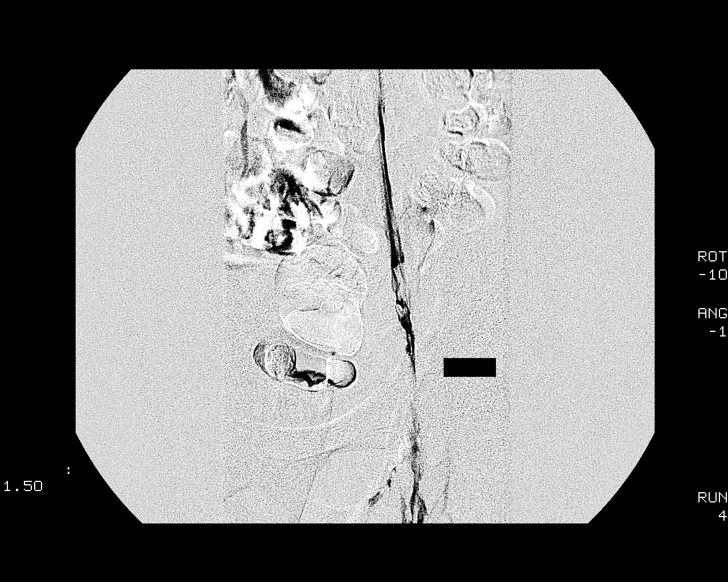
[im 20/22]
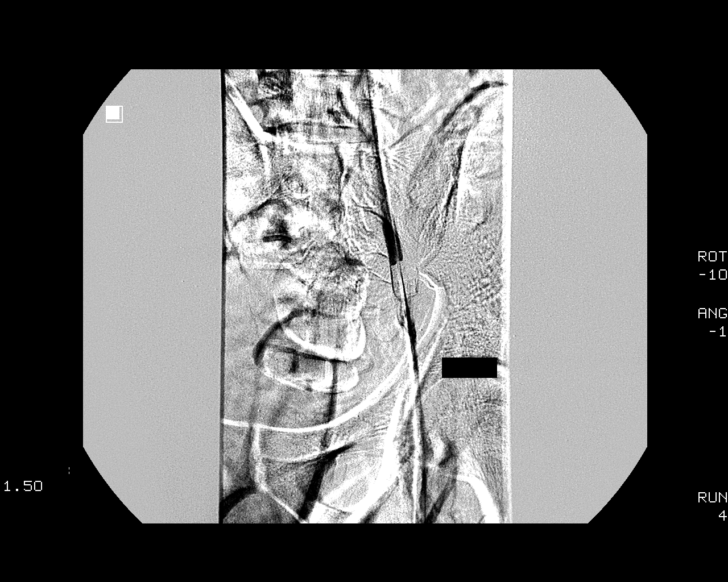
[im 22/22]
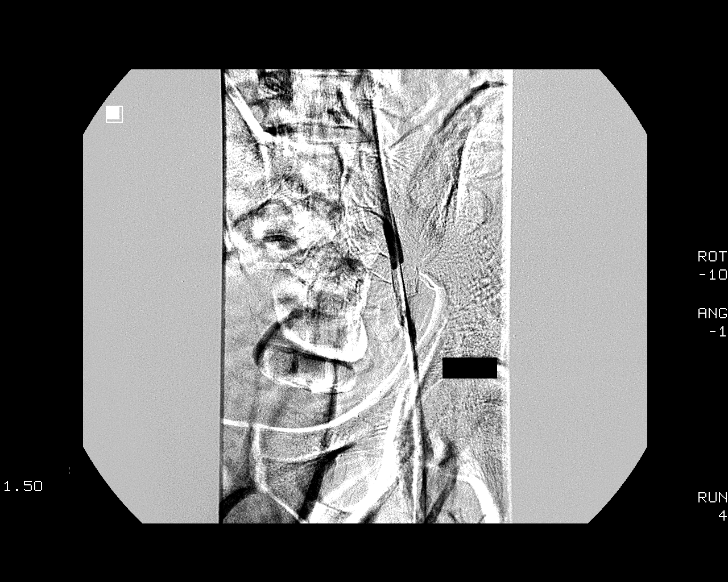

[12 of 22 positions shown; findings below may reference images not displayed]

EXAM:
FOLLOWUP ANGIOGRAPHY OF THE RIGHT LOWER EXTREMITY DURING VENOUS FROM
THE LIVER THERAPY.

ANESTHESIA/SEDATION:
No conscious sedation was administered.

CONTRAST:  30 mL Omnipaque 300

FLUOROSCOPY TIME:  48 seconds.

PROCEDURE:
The procedure risks, benefits, and alternatives were explained to
the patient. Questions regarding the procedure were encouraged and
answered. The patient understands and consents to the procedure.

The pre-existing thrombolytic infusion catheter was injected with
contrast material and venography performed of the right lower
extremity. Thrombolytic infusion was discontinued and the catheter
connected to normal saline at a rate of 50 mL/hour. IV heparin was
also held for 2 hr.

The right jugular dialysis catheter was then removed after sterile
prep with chlorhexidine. A 2-0 Ethilon pursestring suture was
applied at the catheter exit site. A sterile dressing was applied.

COMPLICATIONS:
None
FINDINGS: The right femoral vein in the thigh, common femoral vein and iliac
veins show mildly improved patency since the venogram yesterday.
There remains a large burden of chronic appearing thrombus. Due to
significant decrease in fibrinogen, thrombolytic infusion will be
held for a few hours as a new fibrinogen level was checked. If there
is a rise in fibrinogen, tenecteplase infusion will be restarted at
a lower rate.
IMPRESSION: Follow-up angiography shows mildly improved venous patency during
thrombolytic therapy. A significant amount of chronic appearing
thrombus remains. Due to drop in fibrinogen, thrombolytic therapy
will be held for a few hours and another fibrinogen level checked.

## 2015-07-01 ENCOUNTER — Other Ambulatory Visit (INDEPENDENT_AMBULATORY_CARE_PROVIDER_SITE_OTHER): Payer: BLUE CROSS/BLUE SHIELD

## 2015-07-01 ENCOUNTER — Encounter: Payer: Self-pay | Admitting: Family

## 2015-07-01 ENCOUNTER — Ambulatory Visit (INDEPENDENT_AMBULATORY_CARE_PROVIDER_SITE_OTHER): Payer: BLUE CROSS/BLUE SHIELD | Admitting: Family

## 2015-07-01 VITALS — BP 120/70 | HR 78 | Temp 98.1°F | Resp 16 | Ht 64.0 in | Wt 166.4 lb

## 2015-07-01 DIAGNOSIS — I824Y1 Acute embolism and thrombosis of unspecified deep veins of right proximal lower extremity: Secondary | ICD-10-CM

## 2015-07-01 DIAGNOSIS — Z5181 Encounter for therapeutic drug level monitoring: Secondary | ICD-10-CM

## 2015-07-01 LAB — RENAL FUNCTION PANEL
Albumin: 4.6 g/dL (ref 3.5–5.2)
BUN: 17 mg/dL (ref 6–23)
CO2: 28 mEq/L (ref 19–32)
Calcium: 9.6 mg/dL (ref 8.4–10.5)
Chloride: 105 mEq/L (ref 96–112)
Creatinine, Ser: 1.41 mg/dL (ref 0.40–1.50)
GFR: 58.81 mL/min — AB (ref >60.00)
GLUCOSE: 102 mg/dL — AB (ref 70–99)
PHOSPHORUS: 3.3 mg/dL (ref 2.3–4.6)
POTASSIUM: 4.1 meq/L (ref 3.5–5.1)
SODIUM: 141 meq/L (ref 135–145)

## 2015-07-01 MED ORDER — RIVAROXABAN 20 MG PO TABS: ORAL_TABLET | ORAL | Status: DC

## 2015-07-01 NOTE — Progress Notes (Signed)
Pre visit review using our clinic review tool, if applicable. No additional management support is needed unless otherwise documented below in the visit note. 

## 2015-07-01 NOTE — Progress Notes (Signed)
Subjective:    Patient ID: Tom Dalton, male    DOB: December 28, 1975, 40 y.o.   MRN: [id number]  Chief Complaint  Patient presents with  . Follow-up    blood clots, has swelling in his right leg     HPI:  Tom Dalton is a 40 y.o. male who  has a past medical history of Tobacco abuse; Headache(784.0); Pneumonia; Acute renal failure (HCC) (12/24/2010); Blood clot in vein; Right leg DVT (HCC) (05/03/2013); Leg pain (05/17/2013); Unspecified deficiency anemia (05/17/2013); ETOH abuse; and Allergy. and presents today for a follow up office visit.  1.) Long term anticoagulation -Currently maintained on long-term anticoagulation for DVT. Reports taking the medication as prescribed and denies adverse side effects or nuisance bleeding. Does note continued lower extremity edema on the right side. Believes he may have hurt his right calf while working out in the gym. Pain severity is mild and does not effect the way he walks. Modifying factors include stretching which have helped. Course of the symptoms has stayed about the same.    No Known Allergies   Current Outpatient Prescriptions on File Prior to Visit  Medication Sig Dispense Refill  . diphenhydrAMINE (BENADRYL) 25 MG tablet Take 25 mg by mouth as needed.     No current facility-administered medications on file prior to visit.   No past surgical history on file.      Review of Systems  Constitutional: Negative for fever and chills.  Respiratory: Negative for chest tightness and shortness of breath.   Cardiovascular: Negative for chest pain, palpitations and leg swelling.  Hematological: Does not bruise/bleed easily.      Objective:    BP 120/70 mmHg  Pulse 78  Temp(Src) 98.1 F (36.7 C) (Oral)  Resp 16  Ht  (1.626 m)  Wt 166 lb 6.4 oz (75.479 kg)  BMI 28.55 kg/m2  SpO2 98% Nursing note and vital signs reviewed.  Physical Exam  Constitutional: He is oriented to person, place, and time. He appears well-developed and well-nourished.  No distress.  Cardiovascular: Normal rate, regular rhythm, normal heart sounds and intact distal pulses.   Pulmonary/Chest: Effort normal and breath sounds normal.  Musculoskeletal:  Right calf - no obvious deformity or discoloration with mild edema. Palpable related hematoma approximately 1-2 cm in diameter noted on medial aspect of right calf. Mild tenderness. Distal pulses and sensation are intact and appropriate. Negative Homans sign.  Neurological: He is alert and oriented to person, place, and time.  Skin: Skin is warm and dry.  Psychiatric: He has a normal mood and affect. His behavior is normal. Judgment and thought content normal.       Assessment & Plan:   Problem List Items Addressed This Visit      Cardiovascular and Mediastinum   Right leg DVT (HCC) - Primary    Appears stable with current regimen and no adverse side effects. Small encapsulated hematoma noted on medial aspect of right calf. Treat conservatively with ice/heat and stretching. Obtain venous Dopplers to check for circulation which were incomplete last office visit. Obtain renal panel for therapeutic drug monitoring. Continue current dosage of Xarelto for anticoagulation.       Relevant Medications   rivaroxaban (XARELTO) 20 MG TABS tablet   Other Relevant Orders   Renal Function Panel   VAS [place] LOWER EXTREMITY VENOUS (DVT)    Other Visit Diagnoses    Encounter for therapeutic drug monitoring        Relevant Orders  Renal Function Panel    VAS US LOWER EXTREMITY VENOUS (DVT)        I have changed Tom Dalton's XARELTO to rivaroxaban. I am also having him maintain his diphenhydrAMINE.   Meds ordered this encounter  Medications  . rivaroxaban (XARELTO) 20 MG TABS tablet    Sig: TAKE 1 TABLET BY MOUTH DAILY WITH DINNER    Dispense:  30 tablet    Refill:  5    Order Specific Question:  Supervising Provider    Answer:  Hillard DankerRAWFORD, ELIZABETH A [[id number]]     Follow-up: Return in about 6 months (around  01/01/2016), or if symptoms worsen or fail to improve.  Jeanine Luzalone, Gregory, FNP

## 2015-07-01 NOTE — Assessment & Plan Note (Signed)
Appears stable with current regimen and no adverse side effects. Small encapsulated hematoma noted on medial aspect of right calf. Treat conservatively with ice/heat and stretching. Obtain venous Dopplers to check for circulation which were incomplete last office visit. Obtain renal panel for therapeutic drug monitoring. Continue current dosage of Xarelto for anticoagulation.

## 2015-07-01 NOTE — Patient Instructions (Signed)
Thank you for choosing [facility].  Summary/Instructions:  Please continue to take the Xarelto as prescribed.  For your calf: Alternate ice and heat 20 minutes per time 3-4 times per day with stretching.  They will call to schedule your ultrasound.   Your prescription(s) have been submitted to your pharmacy or been printed and provided for you. Please take as directed and contact our office if you believe you are having problem(s) with the medication(s) or have any questions.  Please stop by the lab on the basement level of the building for your blood work. Your results will be released to MyChart (or called to you) after review, usually within 72 hours after test completion. If any changes need to be made, you will be notified at that same time.  If your symptoms worsen or fail to improve, please contact our office for further instruction, or in case of emergency go directly to the emergency room at the closest medical facility.

## 2015-07-08 ENCOUNTER — Other Ambulatory Visit: Payer: Self-pay | Admitting: Family

## 2015-07-08 ENCOUNTER — Ambulatory Visit (HOSPITAL_COMMUNITY)
Admission: RE | Admit: 2015-07-08 | Discharge: 2015-07-08 | Disposition: A | Discharge status: Home / Self Care | Payer: BLUE CROSS/BLUE SHIELD | Source: Ambulatory Visit | Attending: Family | Admitting: Family

## 2015-07-08 DIAGNOSIS — I82511 Chronic embolism and thrombosis of right femoral vein: Secondary | ICD-10-CM | POA: Insufficient documentation

## 2015-07-08 DIAGNOSIS — Z5181 Encounter for therapeutic drug level monitoring: Secondary | ICD-10-CM

## 2015-07-08 DIAGNOSIS — M7989 Other specified soft tissue disorders: Secondary | ICD-10-CM | POA: Insufficient documentation

## 2015-07-08 DIAGNOSIS — M79604 Pain in right leg: Secondary | ICD-10-CM | POA: Diagnosis not present

## 2015-07-08 DIAGNOSIS — I824Y1 Acute embolism and thrombosis of unspecified deep veins of right proximal lower extremity: Secondary | ICD-10-CM

## 2015-07-08 DIAGNOSIS — I82401 Acute embolism and thrombosis of unspecified deep veins of right lower extremity: Secondary | ICD-10-CM | POA: Insufficient documentation

## 2015-07-09 ENCOUNTER — Encounter: Payer: Self-pay | Admitting: Family

## 2016-01-06 ENCOUNTER — Other Ambulatory Visit: Payer: Self-pay | Admitting: Family

## 2016-01-06 DIAGNOSIS — I824Y1 Acute embolism and thrombosis of unspecified deep veins of right proximal lower extremity: Secondary | ICD-10-CM

## 2016-02-03 ENCOUNTER — Other Ambulatory Visit: Payer: Self-pay | Admitting: Family

## 2016-02-03 DIAGNOSIS — I824Y1 Acute embolism and thrombosis of unspecified deep veins of right proximal lower extremity: Secondary | ICD-10-CM

## 2016-03-07 ENCOUNTER — Other Ambulatory Visit: Payer: Self-pay | Admitting: Family

## 2016-03-07 DIAGNOSIS — I824Y1 Acute embolism and thrombosis of unspecified deep veins of right proximal lower extremity: Secondary | ICD-10-CM

## 2016-07-07 ENCOUNTER — Other Ambulatory Visit: Payer: Self-pay | Admitting: Family

## 2016-07-07 DIAGNOSIS — I824Y1 Acute embolism and thrombosis of unspecified deep veins of right proximal lower extremity: Secondary | ICD-10-CM

## 2016-10-11 ENCOUNTER — Other Ambulatory Visit: Payer: Self-pay | Admitting: Family

## 2016-10-11 DIAGNOSIS — I824Y1 Acute embolism and thrombosis of unspecified deep veins of right proximal lower extremity: Secondary | ICD-10-CM

## 2016-10-20 ENCOUNTER — Encounter: Payer: Self-pay | Admitting: Family

## 2016-10-20 ENCOUNTER — Ambulatory Visit (INDEPENDENT_AMBULATORY_CARE_PROVIDER_SITE_OTHER): Payer: BLUE CROSS/BLUE SHIELD | Admitting: Family

## 2016-10-20 DIAGNOSIS — I824Y1 Acute embolism and thrombosis of unspecified deep veins of right proximal lower extremity: Secondary | ICD-10-CM

## 2016-10-20 MED ORDER — RIVAROXABAN 20 MG PO TABS: ORAL_TABLET | ORAL | 3 refills | Status: AC

## 2016-10-20 NOTE — Progress Notes (Signed)
Subjective:    Patient ID: Tom Dalton, male    DOB: 03-30-1975, 41 y.o.   MRN: [id number]  Chief Complaint  Patient presents with  . Medication Refill    xarelto    HPI:  Tom Dalton is a 41 y.o. male who  has a past medical history of Acute renal failure (HCC) (12/24/2010); Allergy; Blood clot in vein; ETOH abuse; Headache(784.0); Leg pain (05/17/2013); Pneumonia; Right leg DVT (HCC) (05/03/2013); Tobacco abuse; and Unspecified deficiency anemia (05/17/2013). and presents today for a follow up office visit.  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 Reports taking the medications as prescribed and denies adverse side effects or nusicance bleeding. Does have some discomfort in his calf when seated for long periods of time. No chest pain, shortness of breath or edema.   No Known Allergies    Outpatient Medications Prior to Visit  Medication Sig Dispense Refill  . diphenhydrAMINE (BENADRYL) 25 MG tablet Take 25 mg by mouth as needed.    Tom Dalton 20 MG TABS tablet TAKE 1 TABLET BY MOUTH DAILY WITH DINNER 30 tablet 2   No facility-administered medications prior to visit.       No past surgical history on file.   Past Medical History:  Diagnosis Date  . Acute renal failure (HCC) 12/24/2010  . Allergy   . Blood clot in vein   . ETOH abuse   . Headache(784.0)    when the weather changes, advil  . Leg pain 05/17/2013  . Pneumonia    5 years ago  . Right leg DVT (HCC) 05/03/2013  . Tobacco abuse   . Unspecified deficiency anemia 05/17/2013      Review of Systems  Constitutional: Negative for chills and fever.  Eyes:       Negative for changes in vision  Respiratory: Negative for cough, chest tightness and wheezing.   Cardiovascular: Negative for chest pain, palpitations and leg swelling.  Neurological: Negative for dizziness, weakness and light-headedness.      Objective:    BP 120/80 (BP Location: Left Arm, Patient Position:  Sitting, Cuff Size: Large)   Pulse 66   Temp 98.5 F (36.9 C) (Oral)   Resp 16   Ht 5\' 4"  (1.626 m)   Wt 167 lb (75.8 kg)   SpO2 98%   BMI 28.67 kg/m  Nursing note and vital signs reviewed.  Physical Exam  Constitutional: He is oriented to person, place, and time. He appears well-developed and well-nourished. No distress.  Cardiovascular: Normal rate, regular rhythm, normal heart sounds and intact distal pulses.   Pulmonary/Chest: Effort normal and breath sounds normal.  Neurological: He is alert and oriented to person, place, and time.  Skin: Skin is warm and dry.  Psychiatric: He has a normal mood and affect. His behavior is normal. Judgment and thought content normal.       Assessment & Plan:   Problem List Items Addressed This Visit      Cardiovascular and Mediastinum   Right leg DVT (HCC)    Chronic need for anticoagulation with no adverse side effects or nuisance bleeding. Occasional right calf cramping with no significant findings on exam. Continue current dosage of Xarelto. Planning a trip to [place] with advice to continue to take medications and move around periodically throughout travel.      Relevant Medications   rivaroxaban (XARELTO) 20 MG TABS tablet       I have changed Tom Dalton to rivaroxaban.  I am also having him maintain his diphenhydrAMINE.   Meds ordered this encounter  Medications  . rivaroxaban (XARELTO) 20 MG TABS tablet    Sig: TAKE 1 TABLET BY MOUTH DAILY WITH DINNER    Dispense:  90 tablet    Refill:  3    Order Specific Question:   Supervising Provider    Answer:   Hillard DankerRAWFORD, ELIZABETH A [[id number]]     Follow-up: Return in about 1 year (around 10/20/2017), or if symptoms worsen or fail to improve.  Jeanine Luzalone, Gregory, FNP

## 2016-10-20 NOTE — Assessment & Plan Note (Signed)
Chronic need for anticoagulation with no adverse side effects or nuisance bleeding. Occasional right calf cramping with no significant findings on exam. Continue current dosage of Xarelto. [place] with advice to continue to take medications and move around periodically throughout travel.

## 2016-10-20 NOTE — Patient Instructions (Addendum)
Thank you for choosing [facility].  SUMMARY AND INSTRUCTIONS:  Please continue to take your medications as prescribed.   Safe travels!  Medication:  Your prescription(s) have been submitted to your pharmacy or been printed and provided for you. Please take as directed and contact our office if you believe you are having problem(s) with the medication(s) or have any questions.   Follow up:  If your symptoms worsen or fail to improve, please contact our office for further instruction, or in case of emergency go directly to the emergency room at the closest medical facility.

## 2017-10-19 ENCOUNTER — Other Ambulatory Visit: Payer: Self-pay | Admitting: *Deleted

## 2017-10-19 DIAGNOSIS — I824Y1 Acute embolism and thrombosis of unspecified deep veins of right proximal lower extremity: Secondary | ICD-10-CM

## 2017-10-29 ENCOUNTER — Encounter: Payer: BLUE CROSS/BLUE SHIELD | Admitting: Family
